# Patient Record
Sex: Female | Born: 1960 | Race: White | Hispanic: No | Marital: Married | Smoking: Former smoker
Health system: Southern US, Community
[De-identification: ages and names within clinical notes are randomized; demographics above are authoritative.]

## PROBLEM LIST (undated history)

## (undated) DIAGNOSIS — M1990 Unspecified osteoarthritis, unspecified site: Secondary | ICD-10-CM

## (undated) DIAGNOSIS — G5600 Carpal tunnel syndrome, unspecified upper limb: Secondary | ICD-10-CM

## (undated) DIAGNOSIS — K7689 Other specified diseases of liver: Secondary | ICD-10-CM

## (undated) DIAGNOSIS — K219 Gastro-esophageal reflux disease without esophagitis: Secondary | ICD-10-CM

## (undated) DIAGNOSIS — M1220 Villonodular synovitis (pigmented), unspecified site: Secondary | ICD-10-CM

## (undated) DIAGNOSIS — E039 Hypothyroidism, unspecified: Secondary | ICD-10-CM

## (undated) DIAGNOSIS — R7303 Prediabetes: Secondary | ICD-10-CM

## (undated) DIAGNOSIS — T41205A Adverse effect of unspecified general anesthetics, initial encounter: Secondary | ICD-10-CM

## (undated) DIAGNOSIS — I471 Supraventricular tachycardia: Secondary | ICD-10-CM

## (undated) DIAGNOSIS — Z9889 Other specified postprocedural states: Secondary | ICD-10-CM

## (undated) DIAGNOSIS — Z973 Presence of spectacles and contact lenses: Secondary | ICD-10-CM

## (undated) DIAGNOSIS — R112 Nausea with vomiting, unspecified: Secondary | ICD-10-CM

## (undated) HISTORY — PX: CARPAL TUNNEL RELEASE: SHX101

## (undated) HISTORY — PX: OTHER SURGICAL HISTORY: SHX169

## (undated) HISTORY — PX: PARTIAL HYSTERECTOMY: SHX80

## (undated) HISTORY — PX: THYROIDECTOMY, PARTIAL: SHX18

## (undated) HISTORY — DX: Supraventricular tachycardia: I47.1

---

## 2000-10-06 ENCOUNTER — Encounter (INDEPENDENT_AMBULATORY_CARE_PROVIDER_SITE_OTHER): Payer: Self-pay | Admitting: Specialist

## 2000-10-07 ENCOUNTER — Inpatient Hospital Stay (HOSPITAL_COMMUNITY): Admission: RE | Admit: 2000-10-07 | Discharge: 2000-10-08 | Payer: Self-pay | Admitting: Otolaryngology

## 2001-09-06 ENCOUNTER — Encounter: Payer: Self-pay | Admitting: Emergency Medicine

## 2001-09-06 ENCOUNTER — Emergency Department (HOSPITAL_COMMUNITY): Admission: EM | Admit: 2001-09-06 | Discharge: 2001-09-06 | Payer: Self-pay | Admitting: Emergency Medicine

## 2001-09-15 ENCOUNTER — Emergency Department (HOSPITAL_COMMUNITY): Admission: EM | Admit: 2001-09-15 | Discharge: 2001-09-16 | Payer: Self-pay | Admitting: Emergency Medicine

## 2001-09-16 ENCOUNTER — Encounter: Payer: Self-pay | Admitting: Emergency Medicine

## 2001-09-19 ENCOUNTER — Encounter (INDEPENDENT_AMBULATORY_CARE_PROVIDER_SITE_OTHER): Payer: Self-pay | Admitting: Specialist

## 2001-09-19 ENCOUNTER — Ambulatory Visit (HOSPITAL_COMMUNITY): Admission: RE | Admit: 2001-09-19 | Discharge: 2001-09-19 | Payer: Self-pay | Admitting: *Deleted

## 2001-09-21 ENCOUNTER — Ambulatory Visit (HOSPITAL_COMMUNITY): Admission: RE | Admit: 2001-09-21 | Discharge: 2001-09-21 | Payer: Self-pay | Admitting: *Deleted

## 2001-09-21 ENCOUNTER — Encounter: Payer: Self-pay | Admitting: *Deleted

## 2002-01-11 ENCOUNTER — Ambulatory Visit (HOSPITAL_COMMUNITY): Admission: RE | Admit: 2002-01-11 | Discharge: 2002-01-11 | Payer: Self-pay | Admitting: Orthopedic Surgery

## 2002-01-11 ENCOUNTER — Encounter: Payer: Self-pay | Admitting: Orthopedic Surgery

## 2002-08-17 HISTORY — PX: CHOLECYSTECTOMY: SHX55

## 2003-04-17 ENCOUNTER — Emergency Department (HOSPITAL_COMMUNITY): Admission: EM | Admit: 2003-04-17 | Discharge: 2003-04-17 | Payer: Self-pay | Admitting: Emergency Medicine

## 2003-04-19 ENCOUNTER — Encounter: Payer: Self-pay | Admitting: Unknown Physician Specialty

## 2003-04-19 ENCOUNTER — Ambulatory Visit (HOSPITAL_COMMUNITY): Admission: RE | Admit: 2003-04-19 | Discharge: 2003-04-19 | Payer: Self-pay | Admitting: Unknown Physician Specialty

## 2003-05-03 ENCOUNTER — Encounter: Admission: RE | Admit: 2003-05-03 | Discharge: 2003-05-03 | Payer: Self-pay | Admitting: General Surgery

## 2003-05-03 ENCOUNTER — Encounter: Payer: Self-pay | Admitting: General Surgery

## 2003-05-04 ENCOUNTER — Encounter: Payer: Self-pay | Admitting: Emergency Medicine

## 2003-05-04 ENCOUNTER — Emergency Department (HOSPITAL_COMMUNITY): Admission: EM | Admit: 2003-05-04 | Discharge: 2003-05-04 | Payer: Self-pay | Admitting: Emergency Medicine

## 2003-05-17 ENCOUNTER — Ambulatory Visit (HOSPITAL_COMMUNITY): Admission: RE | Admit: 2003-05-17 | Discharge: 2003-05-17 | Payer: Self-pay | Admitting: Internal Medicine

## 2003-05-17 ENCOUNTER — Encounter (INDEPENDENT_AMBULATORY_CARE_PROVIDER_SITE_OTHER): Payer: Self-pay | Admitting: Internal Medicine

## 2003-06-17 ENCOUNTER — Emergency Department (HOSPITAL_COMMUNITY): Admission: EM | Admit: 2003-06-17 | Discharge: 2003-06-17 | Payer: Self-pay | Admitting: Emergency Medicine

## 2003-07-06 ENCOUNTER — Ambulatory Visit (HOSPITAL_COMMUNITY): Admission: RE | Admit: 2003-07-06 | Discharge: 2003-07-06 | Payer: Self-pay | Admitting: Internal Medicine

## 2004-10-24 ENCOUNTER — Ambulatory Visit: Payer: Self-pay | Admitting: Internal Medicine

## 2004-11-05 ENCOUNTER — Ambulatory Visit (HOSPITAL_COMMUNITY): Admission: RE | Admit: 2004-11-05 | Discharge: 2004-11-05 | Payer: Self-pay | Admitting: Internal Medicine

## 2004-11-05 ENCOUNTER — Ambulatory Visit: Payer: Self-pay | Admitting: Internal Medicine

## 2004-11-06 ENCOUNTER — Ambulatory Visit (HOSPITAL_COMMUNITY): Admission: RE | Admit: 2004-11-06 | Discharge: 2004-11-06 | Payer: Self-pay | Admitting: Internal Medicine

## 2004-11-25 ENCOUNTER — Ambulatory Visit: Payer: Self-pay | Admitting: Internal Medicine

## 2005-08-03 ENCOUNTER — Ambulatory Visit (HOSPITAL_COMMUNITY): Admission: RE | Admit: 2005-08-03 | Discharge: 2005-08-03 | Payer: Self-pay | Admitting: Internal Medicine

## 2005-08-03 ENCOUNTER — Encounter (INDEPENDENT_AMBULATORY_CARE_PROVIDER_SITE_OTHER): Payer: Self-pay | Admitting: Internal Medicine

## 2005-08-03 ENCOUNTER — Ambulatory Visit: Payer: Self-pay | Admitting: Internal Medicine

## 2006-03-27 ENCOUNTER — Emergency Department (HOSPITAL_COMMUNITY): Admission: EM | Admit: 2006-03-27 | Discharge: 2006-03-27 | Payer: Self-pay | Admitting: Emergency Medicine

## 2006-09-16 ENCOUNTER — Ambulatory Visit: Payer: Self-pay | Admitting: Internal Medicine

## 2007-06-22 ENCOUNTER — Ambulatory Visit: Payer: Self-pay | Admitting: Cardiology

## 2007-06-22 ENCOUNTER — Observation Stay (HOSPITAL_COMMUNITY): Admission: EM | Admit: 2007-06-22 | Discharge: 2007-06-23 | Payer: Self-pay | Admitting: *Deleted

## 2007-10-31 ENCOUNTER — Emergency Department (HOSPITAL_COMMUNITY): Admission: EM | Admit: 2007-10-31 | Discharge: 2007-10-31 | Payer: Self-pay | Admitting: Emergency Medicine

## 2008-08-05 ENCOUNTER — Emergency Department (HOSPITAL_COMMUNITY): Admission: EM | Admit: 2008-08-05 | Discharge: 2008-08-05 | Payer: Self-pay | Admitting: Emergency Medicine

## 2008-10-20 IMAGING — CR DG CHEST 1V PORT
1 series · 1 of 1 positions shown · non-contrast
Comparison: None.

CLINICAL DATA: Chest pain.

PORTABLE CHEST - 1 VIEW  [DATE]/6000 0252 hours:

[view not recorded]
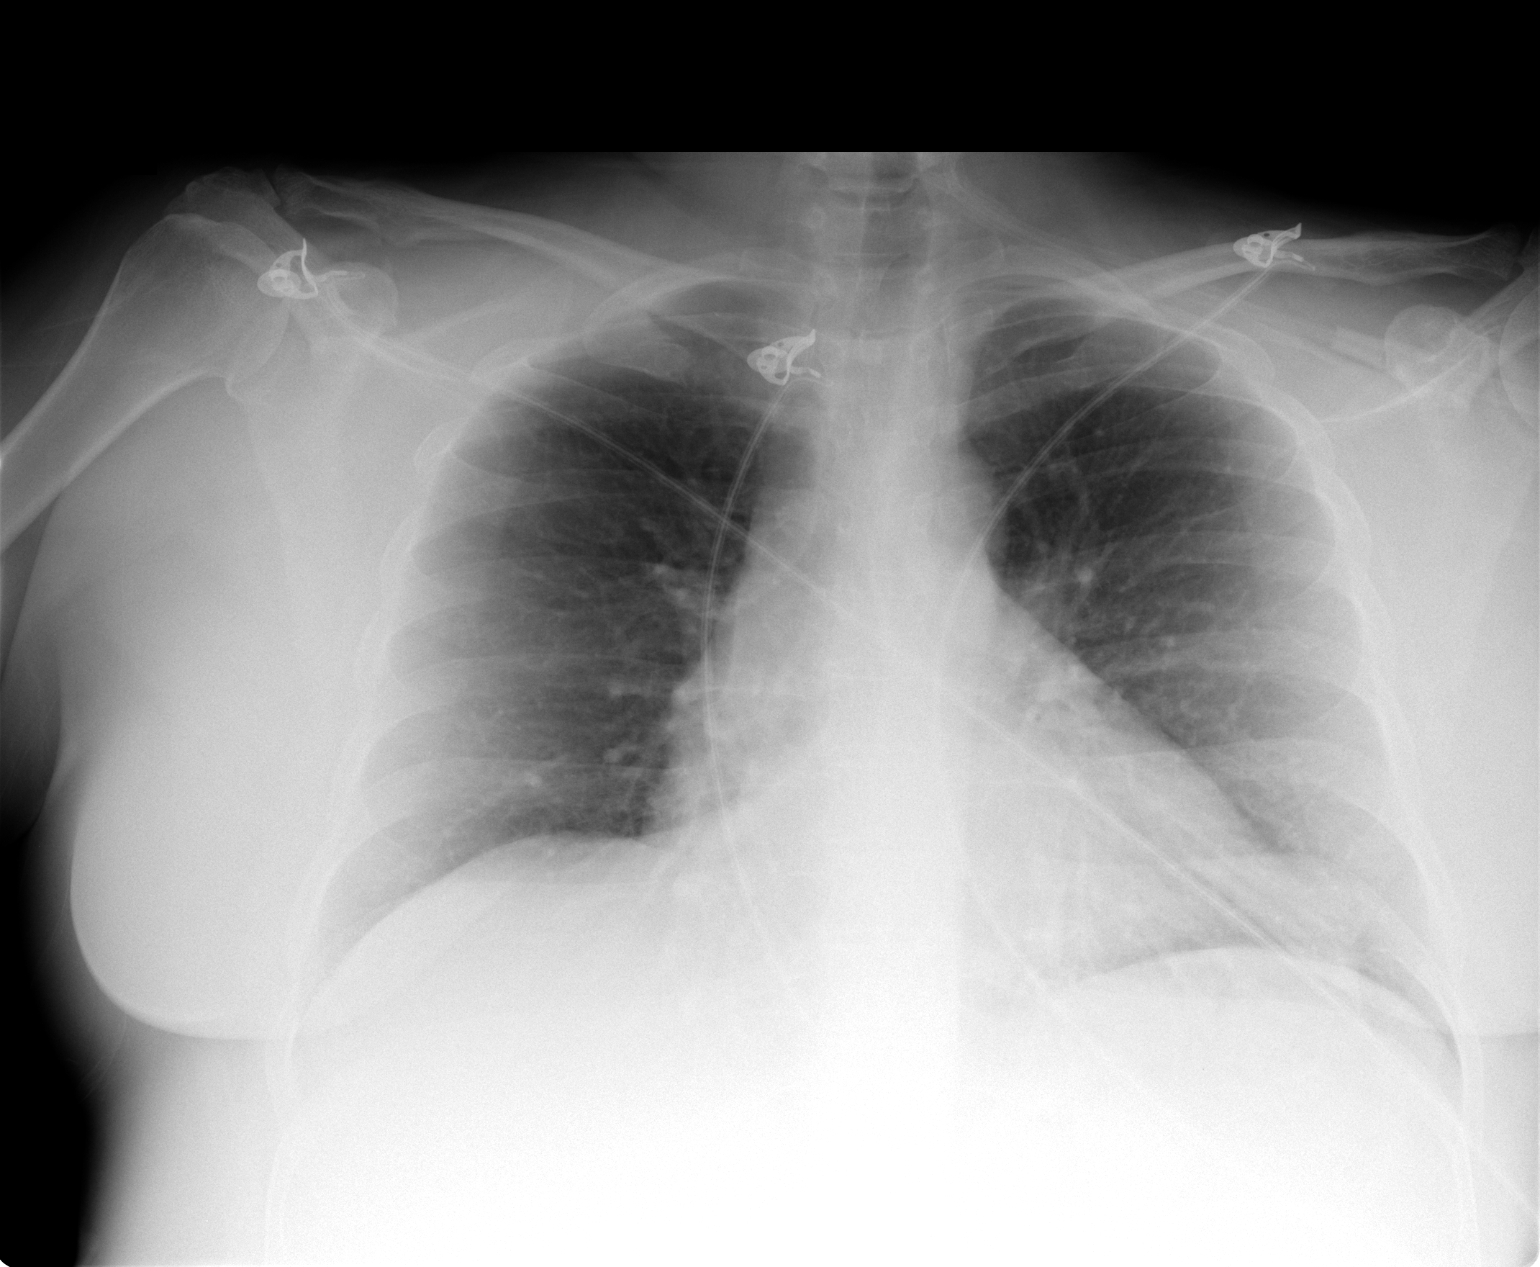

[1 of 1 positions shown; findings below may reference images not displayed]

FINDINGS: Heart size normal for the [facility] portable technique. Lungs clear. No
significant pleural effusions.
IMPRESSION: No acute cardiopulmonary disease.

## 2008-10-20 IMAGING — CT CT ANGIO CHEST
1 of 2 series · 19 of 32 positions shown · IV contrast (Omnipaque 300)
Comparison: none

CLINICAL DATA: Chest pain, shortness of breath

[Series 9: thin pacs · axial · 0.81mm/px · z∈[+804,+1028]mm · 19 of 249 slices shown]
[im 13/249  lung]
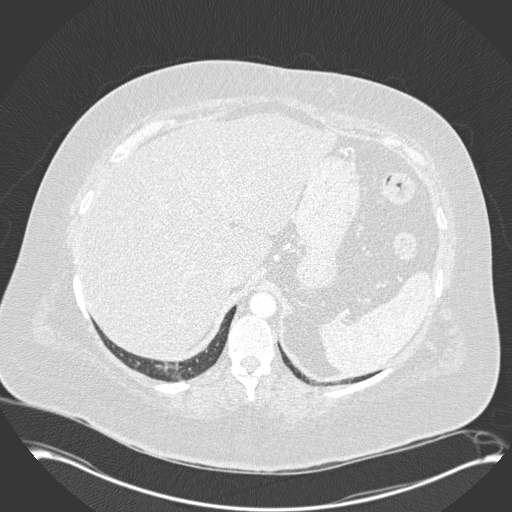
[im 25/249  mediastinal]
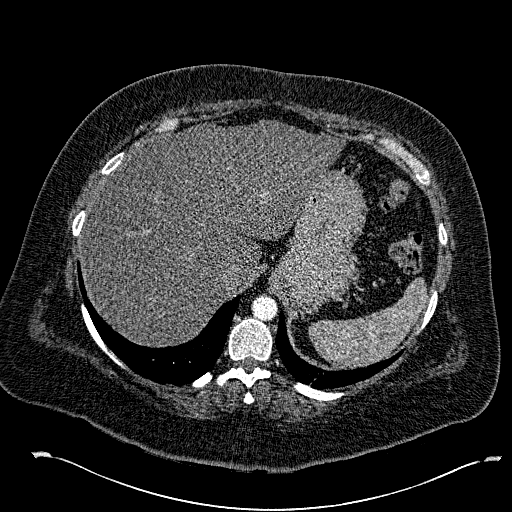
[im 38/249  lung]
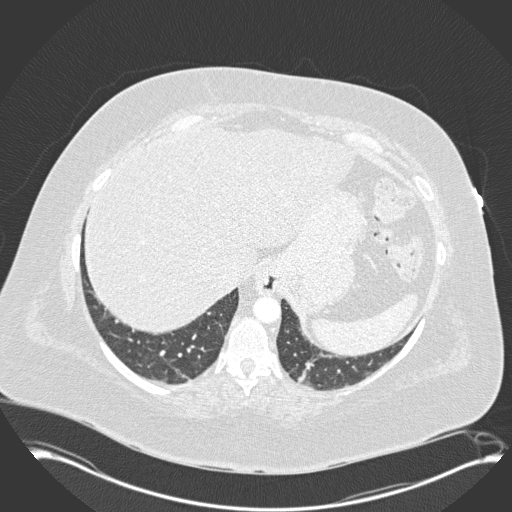
[im 63/249  mediastinal]
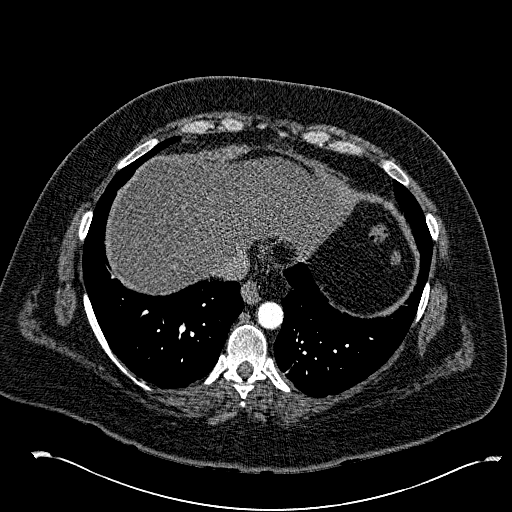
[im 75/249  lung]
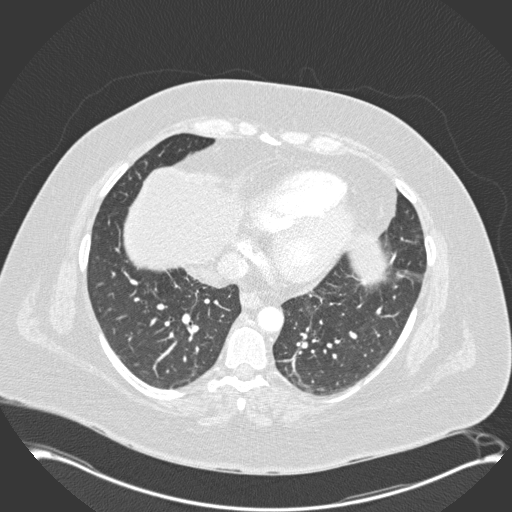
[im 83/249  mediastinal]
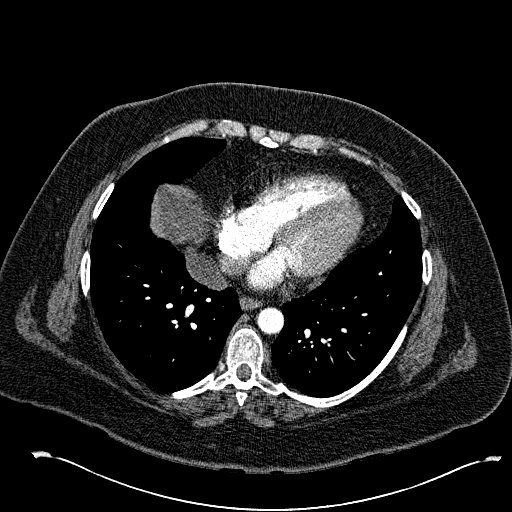
[im 87/249  lung]
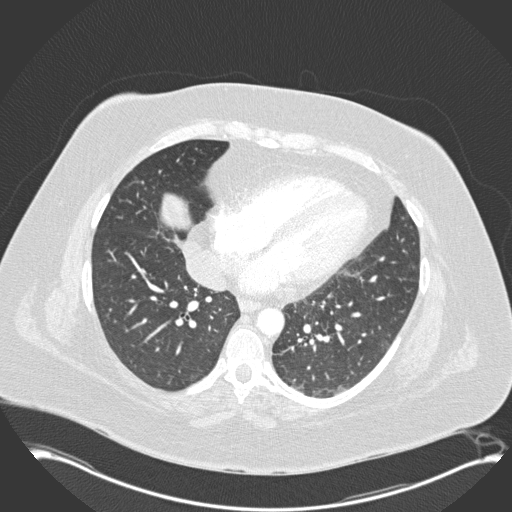
[im 100/249  mediastinal]
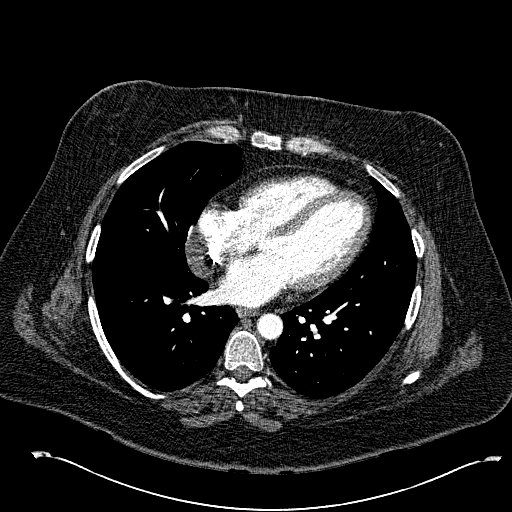
[im 112/249  lung]
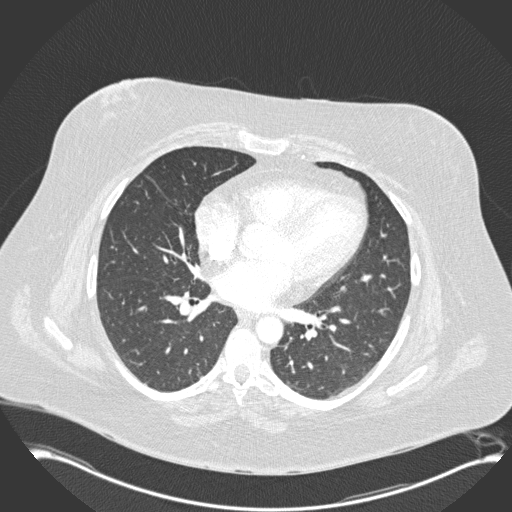
[im 125/249  mediastinal]
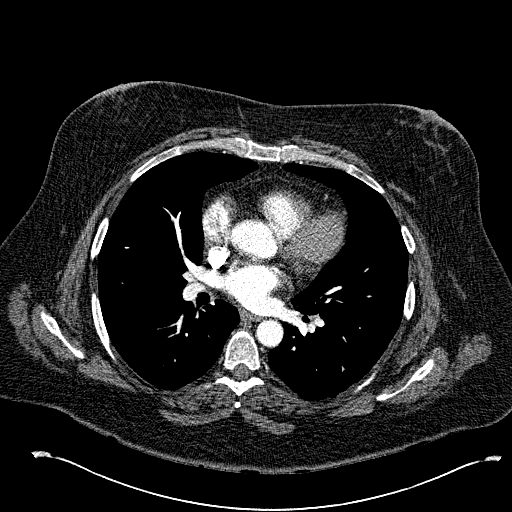
[im 137/249  lung]
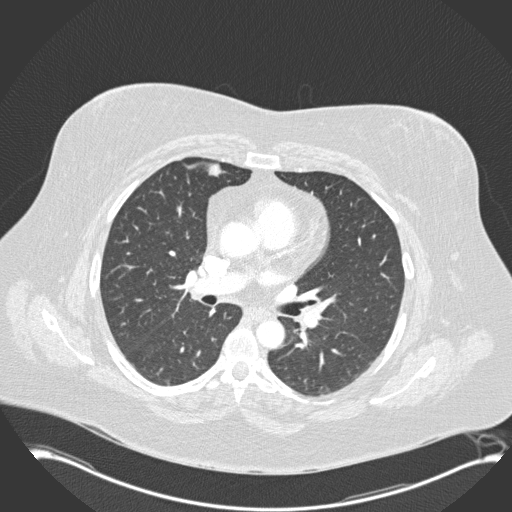
[im 149/249  mediastinal]
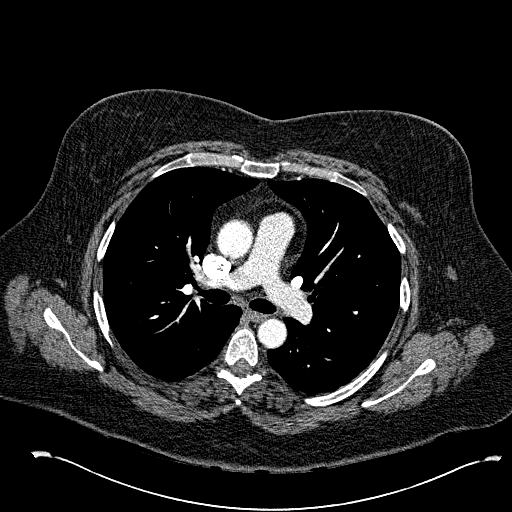
[im 162/249  lung]
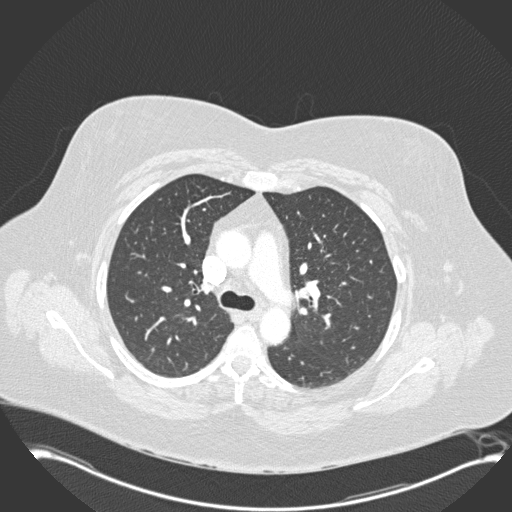
[im 166/249  mediastinal]
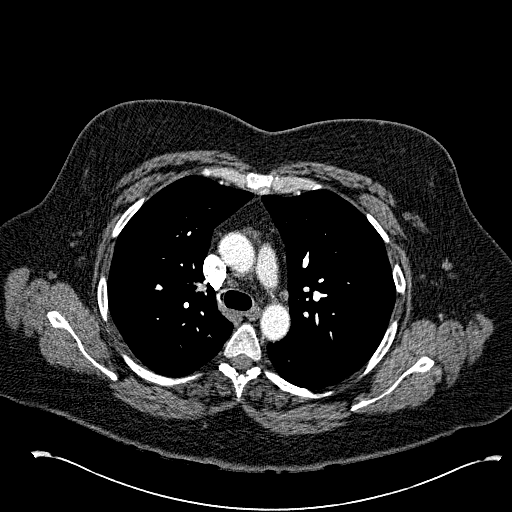
[im 174/249  lung]
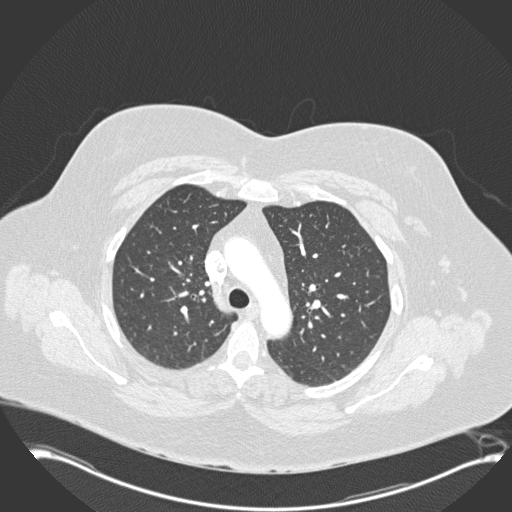
[im 187/249  mediastinal]
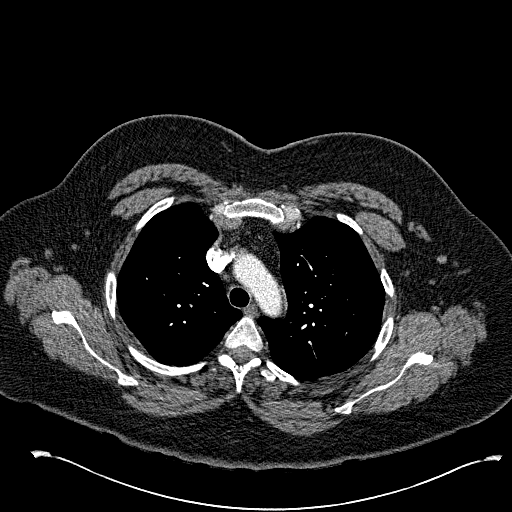
[im 211/249  lung]
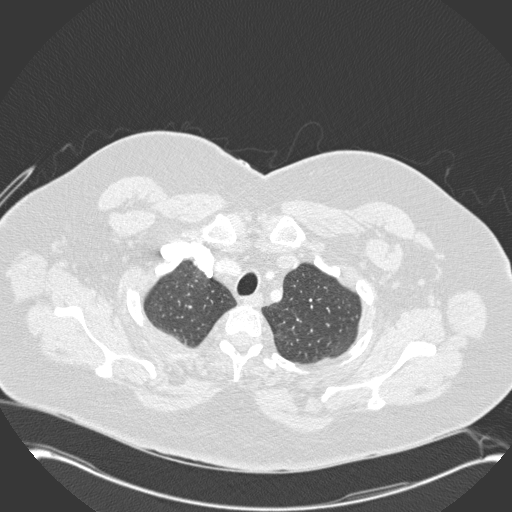
[im 224/249  mediastinal]
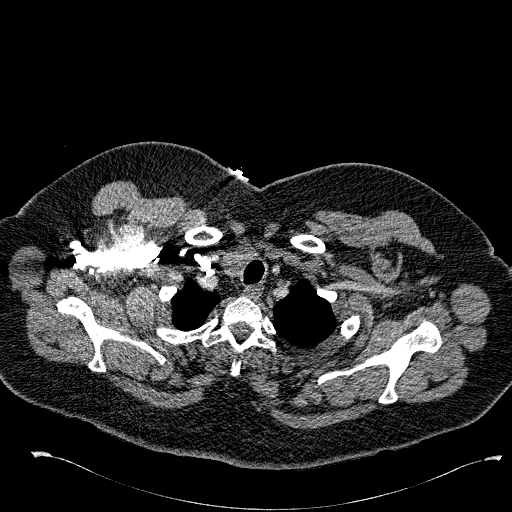
[im 236/249  lung]
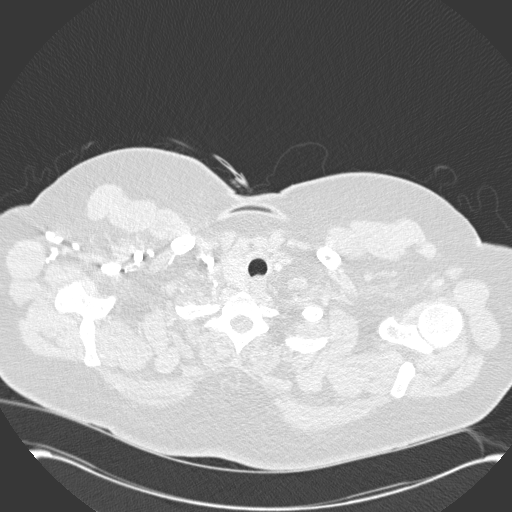

[19 of 32 positions shown; findings below may reference images not displayed]

CT angiogram chest   with contrast:

Multidetector helical CT of the chest was obtained after 80 ml Omnipaque 300 
IV. CT multiplanar reconstructions were rendered to evaluate the vascular
anatomy.
No previous for comparison. There is good contrast opacification of pulmonary
artery branches with no discrete filling defect to suggest acute PE. Good
enhancement of the thoracic aorta, which is unremarkable. No pleural or
pericardial effusion. 2.4 x 5 cm fluid attenuation region posterior to the right
atrium, probably a pericardial cyst. No hilar or mediastinal adenopathy.
Apparent partial left thyroidectomy. Diffuse fatty infiltration of the
visualized portions of liver. Linear scarring or atelectasis in both lung bases.
No focal infiltrate or nodule. Minimal spondylitic changes in the lower thoracic
spine.
IMPRESSION: 1. Negative for acute PE or thoracic aortic dissection.
2. 5 cm pericardial lesion, probably benign pericardial cyst. MR Kaki be useful
to confirm and exclude neoplasm.
3. Fatty liver

## 2009-08-17 HISTORY — PX: FRACTURE SURGERY: SHX138

## 2009-08-17 HISTORY — PX: TIBIA HARDWARE REMOVAL: SUR1133

## 2009-10-18 ENCOUNTER — Ambulatory Visit (HOSPITAL_BASED_OUTPATIENT_CLINIC_OR_DEPARTMENT_OTHER): Admission: RE | Admit: 2009-10-18 | Discharge: 2009-10-18 | Payer: Self-pay | Admitting: Orthopedic Surgery

## 2010-01-12 ENCOUNTER — Emergency Department (HOSPITAL_COMMUNITY): Admission: EM | Admit: 2010-01-12 | Discharge: 2010-01-12 | Payer: Self-pay | Admitting: Emergency Medicine

## 2010-01-15 ENCOUNTER — Ambulatory Visit (HOSPITAL_BASED_OUTPATIENT_CLINIC_OR_DEPARTMENT_OTHER): Admission: RE | Admit: 2010-01-15 | Discharge: 2010-01-16 | Payer: Self-pay | Admitting: Orthopedic Surgery

## 2010-09-07 ENCOUNTER — Encounter: Payer: Self-pay | Admitting: Internal Medicine

## 2010-09-18 ENCOUNTER — Emergency Department (HOSPITAL_COMMUNITY)
Admission: EM | Admit: 2010-09-18 | Discharge: 2010-09-18 | Disposition: A | Discharge status: Home / Self Care | Payer: BC Managed Care – PPO | Attending: Emergency Medicine | Admitting: Emergency Medicine

## 2010-09-18 ENCOUNTER — Emergency Department (HOSPITAL_COMMUNITY)
Admit: 2010-09-18 | Discharge: 2010-09-18 | Disposition: A | Discharge status: Home / Self Care | Payer: BC Managed Care – PPO

## 2010-09-18 DIAGNOSIS — R112 Nausea with vomiting, unspecified: Secondary | ICD-10-CM | POA: Insufficient documentation

## 2010-09-18 DIAGNOSIS — R1031 Right lower quadrant pain: Secondary | ICD-10-CM | POA: Insufficient documentation

## 2010-09-18 LAB — DIFFERENTIAL
Basophils Relative: 0 % (ref 0–1)
Eosinophils Absolute: 0.2 10*3/uL (ref 0.0–0.7)
Eosinophils Relative: 2 % (ref 0–5)
Lymphs Abs: 2.7 10*3/uL (ref 0.7–4.0)
Monocytes Absolute: 0.5 10*3/uL (ref 0.1–1.0)
Neutro Abs: 5 10*3/uL (ref 1.7–7.7)

## 2010-09-18 LAB — COMPREHENSIVE METABOLIC PANEL
ALT: 26 U/L (ref 0–35)
Albumin: 4 g/dL (ref 3.5–5.2)
BUN: 9 mg/dL (ref 6–23)
CO2: 27 mEq/L (ref 19–32)
Calcium: 9.6 mg/dL (ref 8.4–10.5)
Chloride: 104 mEq/L (ref 96–112)
Creatinine, Ser: 0.81 mg/dL (ref 0.4–1.2)
GFR calc non Af Amer: >60 mL/min (ref >60)
Total Protein: 6.8 g/dL (ref 6.0–8.3)

## 2010-09-18 LAB — CBC
HCT: 37.4 % (ref 36.0–46.0)
MCHC: 35.3 g/dL (ref 30.0–36.0)
MCV: 85.6 fL (ref 78.0–100.0)
WBC: 8.3 10*3/uL (ref 4.0–10.5)

## 2010-09-18 LAB — URINALYSIS, ROUTINE W REFLEX MICROSCOPIC
Ketones, ur: NEGATIVE mg/dL
Nitrite: NEGATIVE
Specific Gravity, Urine: 1.02 (ref 1.005–1.030)
pH: 6 (ref 5.0–8.0)

## 2010-09-18 MED ORDER — IOHEXOL 300 MG/ML  SOLN
100.0000 mL | Freq: Once | INTRAMUSCULAR | Status: AC | PRN
  Administered 2010-09-18: 100 mL via INTRAVENOUS

## 2010-10-01 ENCOUNTER — Ambulatory Visit (INDEPENDENT_AMBULATORY_CARE_PROVIDER_SITE_OTHER): Payer: BC Managed Care – PPO | Admitting: Internal Medicine

## 2010-10-01 DIAGNOSIS — K2270 Barrett's esophagus without dysplasia: Secondary | ICD-10-CM

## 2010-10-01 DIAGNOSIS — Z86010 Personal history of colonic polyps: Secondary | ICD-10-CM

## 2010-10-01 DIAGNOSIS — K625 Hemorrhage of anus and rectum: Secondary | ICD-10-CM

## 2010-10-16 ENCOUNTER — Ambulatory Visit (HOSPITAL_BASED_OUTPATIENT_CLINIC_OR_DEPARTMENT_OTHER)
Admission: RE | Admit: 2010-10-16 | Discharge: 2010-10-16 | Disposition: A | Discharge status: Home / Self Care | Payer: BC Managed Care – PPO | Source: Ambulatory Visit | Attending: Orthopedic Surgery | Admitting: Orthopedic Surgery

## 2010-10-16 DIAGNOSIS — M659 Synovitis and tenosynovitis, unspecified: Secondary | ICD-10-CM | POA: Insufficient documentation

## 2010-10-16 DIAGNOSIS — Z472 Encounter for removal of internal fixation device: Secondary | ICD-10-CM | POA: Insufficient documentation

## 2010-10-16 DIAGNOSIS — M65979 Unspecified synovitis and tenosynovitis, unspecified ankle and foot: Secondary | ICD-10-CM | POA: Insufficient documentation

## 2010-10-16 LAB — POCT HEMOGLOBIN-HEMACUE: Hemoglobin: 14 g/dL (ref 12.0–15.0)

## 2010-10-22 ENCOUNTER — Encounter (INDEPENDENT_AMBULATORY_CARE_PROVIDER_SITE_OTHER): Payer: BC Managed Care – PPO | Admitting: Internal Medicine

## 2010-10-29 NOTE — Op Note (Signed)
  NAMEBAKER, KOGLER NO.:  [id number]  MEDICAL RECORD NO.:  [id number]           PATIENT TYPE:  LOCATION:                                 FACILITY:  PHYSICIAN:  Loreta Ave, M.D. DATE OF BIRTH:  [birth year]  DATE OF PROCEDURE:  10/16/2010 DATE OF DISCHARGE:                              OPERATIVE REPORT   PREOPERATIVE DIAGNOSES:  Right ankle painful retained hardware, distal fibula, right ankle.  Secondary tenosynovitis, peroneal tendons adjacent to the plate.  POSTOPERATIVE DIAGNOSES:  Right ankle painful retained hardware, distal fibula, right ankle.  Secondary tenosynovitis, peroneal tendons adjacent to the plate with a moderate amount of tenosynovitis and fluid within the peroneal tendon sheaths.  Healed fracture.  PROCEDURE:  Right ankle removal of plate and screws.  Tenosynovectomy and decompression of peroneal tendons.  SURGEON:  Loreta Ave, MD.  ASSISTANT:  Genene Churn. Barry Dienes, [place].  ANESTHESIA:  General.  BLOOD LOSS:  Minimal.  SPECIMEN:  None.  CULTURES:  None.  COMPLICATIONS:  None.  DRESSING:  Soft compressive short-leg splint.  PROCEDURE IN DETAIL:  The patient was brought to the operating room, placed on the operating table in supine position.  After adequate anesthesia had been obtained, tourniquet applied.  Prepped and draped in usual sterile fashion.  Exsanguinated with elevation, Esmarch tourniquet was inflated to 250 mmHg.  At the previous incision, lateral aspect of the ankle distally was opened.  Skin and subcutaneous tissue was divided.  Moderate amount of edema and clear fluid in the area.  The plate was exposed.  The 7 screws, all removed.  The plate removed.  The fracture below was nicely healed.  A little bony prominence contour throughout.  Just behind that the peroneal tendon sheath was opened and yield any fluid and tenosynovitis throughout that.  That was decompressed all the way down to the ankle just above  the retinaculum holding the peroneal tendons behind the fibula.  Tenosynovectomy fluid was debrided.  No evidence of infection.  Wound was copiously irrigated. Closed with Vicryl and staples.  Margins were injected with Marcaine.  Sterile compressive dressing applied.  Short-leg splint applied.  Tourniquet was inflated and removed.  Anesthesia reversed.  Brought to the recovery room.  Tolerated the surgery well.  No complications.     Loreta Ave, M.D.     DFM/MEDQ  D:  10/16/2010  T:  10/17/2010  Job:  [id number]  Electronically Signed by Mckinley Jewel M.D. on 10/29/2010 01:35:37 PM

## 2010-11-03 LAB — POCT HEMOGLOBIN-HEMACUE: Hemoglobin: 12.5 g/dL (ref 12.0–15.0)

## 2010-11-06 ENCOUNTER — Ambulatory Visit (HOSPITAL_BASED_OUTPATIENT_CLINIC_OR_DEPARTMENT_OTHER)
Admission: RE | Admit: 2010-11-06 | Payer: BC Managed Care – PPO | Source: Ambulatory Visit | Admitting: Orthopedic Surgery

## 2010-11-09 NOTE — Consult Note (Signed)
Shelly [name], Shelly [name]               ACCOUNT NO.:  [id number]  MEDICAL RECORD NO.:  [id number]           PATIENT TYPE: AMB  LOCATION: [place]                    FACILITY:  clinic   PHYSICIAN:  Lionel December, M.D.    DATE OF BIRTH:  06-Feb-1961  DATE OF CONSULTATION: 10/01/2010 DATE OF DISCHARGE:                                 CONSULTATION   REASON FOR CONSULTATION:  Breakthrough acid reflux and rectal bleeding.  HISTORY OF PRESENT ILLNESS:  Shelly [name] is a 50 year old female presenting today with complaints of breakthrough acid reflux for more than 2 months.  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  In 2006, she underwent an EGD by Dr. Karilyn Cota, and she was found to have a small less than 1 cm patch of erythematous mucosa at the GE junction, which was positive for Barrett esophagus.  She also had a noncritical Schatzki's ring and a moderate size sliding hiatal hernia.  Dr. Karilyn Cota referred her to [facility] and she was seen by Dr. Lorin Picket.  She was not a good surgical candidate.  Her last colonoscopy was in May 2009 at [facility] by Dr. Loreta Ave.  She underwent it for a 16-month history of nonbloody diarrhea with lower abdominal cramps and urgency.  There was no evidence of colitis.  Random biopsies were taken.  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  She also states that she has had rectal bleeding about 2-3 times a week.  The bleeding is not related to any constipation or diarrhea.  She is not taking any NSAIDs.  She does, however, have episodes of constipation.  She was recently seen in the emergency room for abdominal pain, and her exam was essentially normal.  Her labs from the ED on September 18, 2010:  Her WBC count was 8.3, hemoglobin 13.2, hematocrit 37.4, MCV 85.6, platelets 229.  Her sodium was 140, potassium was 4.4, chloride 104, glucose 98, BUN 9, creatinine 0.81, calcium 9.6, total protein 6.8, albumin 4.0, AST 17, ALT 26, ALP 59, total  bili 0.6.  Her urine was negative.  She underwent a CT of the abdomen with contrast, which revealed a normal appendix.  No acute abdominal process.  Stable degenerative and postoperative changes.  Her abdominal pain was located in the right lower quadrant.  She is allergic to SULFA.  MEDICATIONS: 1. Nexium 20 mg a day. 2. Thyroid medication 60 mg 5 days a week and 75 mg 2 days a week.  OBJECTIVE:  VITAL SIGNS:  Her weight is 221, her height is 5 feet 6 inches.  Her blood pressure is 104/82, her temperature is 97.7, her pulse is 76. HEENT:  She has natural teeth.  Her oral mucosa is moist.  No lesions. Her conjunctivae is pink.  Her sclerae are anicteric.  Thyroid is normal.  There is no cervical lymphadenopathy. LUNGS:  Clear. HEART:  Regular rate and rhythm. ABDOMEN:  Soft.  Bowel sounds are positive.  No tenderness.  No masses felt.  Her stool was guaiac negative.  ASSESSMENT:  Shelly [name] is a 50 year old female presenting today with complaints of breakthrough acid reflux for about 2 months.  She also complains of having noticed some rectal bleeding, which is occurring 2-3 times a week.  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  RECOMMENDATIONS:  We will schedule an EGD with Dr. Karilyn Cota to rule out peptic ulcer disease and for surveillance of her Barrett esophagus.  Her last EGD apparently was in 2006 per her records.  We will also schedule a colonoscopy with Dr. Karilyn Cota with this new onset of rectal bleeding.  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  We will increase her Nexium 40 mg to twice a day, and the risks and benefits were reviewed with the patient.  She is agreeable.    ______________________________ Dorene Ar, NP   ______________________________ Lionel December, M.D.    TS/MEDQ  D:  10/01/2010  T:  10/02/2010  Job:  [id number]  Electronically Signed by Dorene Ar PA on 11/07/2010 11:34:47 AM Electronically Signed by Lionel December  M.D. on 11/09/2010 01:22:06 PM

## 2010-12-30 NOTE — Consult Note (Signed)
NAMECERRIA, RANDHAWA               ACCOUNT NO.:  [id number]   MEDICAL RECORD NO.:  [id number]          PATIENT TYPE:  OBV   LOCATION:  A213                          FACILITY:  [facility]   PHYSICIAN:  Gerrit Friends. Dietrich Pates, MD, FACCDATE OF BIRTH:  1961-04-21   DATE OF CONSULTATION:  06/22/2007  DATE OF DISCHARGE:                                 CONSULTATION   CARDIOLOGY CONSULTATION.   REFERRING PHYSICIAN:  Hollice Espy, M.D.   PRIMARY CARE PHYSICIAN:  Dr. Samuel Jester   HISTORY OF PRESENT ILLNESS:  50 year old woman with no prior cardiac  history presents with chest pain.  Ms. Gadomski has had a number of  interesting health problems, but not much in the way of chronic medical  conditions.  She has not had hypertension nor diabetes.  She is unaware  of her lipid status.  She does smoke cigarettes with a total consumption  of 20-pack-years.  She has never previously been evaluated by a  cardiologist nor undergone any cardiac testing.  She has no significant  history of chest discomfort nor dyspnea.   The current incident began when she was driving in her car.  She noted  the sudden onset of very severe anterior and right upper chest pressure  that prevented her from continuing to drive.  After approximately 10  minutes, the pain waned in intensity.  Over the rest of the evening, she  had waxing and waning symptoms.  She was able to sleep but awoke with  continuing discomfort prompting her to come to the emergency department.  There was some associated lightheadedness but no dyspnea.  She has had  palpitations in the past and did have some with this event as well as  diaphoresis.  There was no nausea and no emesis.   On arrival in the emergency department, she was given sublingual  nitroglycerin with improvement.  At the present time, she feels fine.  Initial cardiac markers are negative.   The past medical history is notable for hysterectomy for endometriosis,  partial  thyroidectomy for a thyroid adenoma with subsequent  hypothyroidism, resection of benign liver tumors that were causing  abdominal pain, and incidental cholecystectomy performed at the same  time.  She also has GERD and Barrett's esophagus.   The usual medications include only Armour Thyroid 75 mg daily and  Aciphex 20 mg daily.   ALLERGIES:  APPARENT AN ALLERGY TO SULFA DRUGS IS REPORTED, MANIFESTING  AS RASH.   SOCIAL HISTORY:  Married and lives in [place]; works in an office; no  excessive use of alcohol.   FAMILY HISTORY:  No prominent cardiac conditions.   REVIEW OF SYSTEMS:  Notable for intermittent migraine headaches, recent  skin eruption of the forearms that is resolving spontaneously, a regular  diet at home, the need for corrective lenses for far vision, anxiety and  sleep disturbance.  All other systems are reviewed and are negative.   PHYSICAL EXAMINATION:  GENERAL:  Well-appearing overweight woman in no  acute distress.  VITAL SIGNS:  The temperature is 98, heart rate 75 and regular,  respirations 18, blood  pressure 135/85, O2 saturations 100% on room air.  HEENT:  EOMs full; pupils equal, round, react to light; normal oral  mucosa.  NECK:  No jugular venous distention; normal carotid upstrokes without  bruits; surgical scar at the base of the neck.  LUNGS:  Clear.  CARDIAC:  Normal first and second heart sounds; normal PMI.  ABDOMEN:  Soft and nontender; no organomegaly; no masses.  EXTREMITIES:  No edema; normal distal pulses.  NEUROMUSCULAR:  Symmetric strength and tone; normal cranial nerves.   EKG:  Normal sinus rhythm; nondiagnostic inferior Q-waves; slightly  decreased voltage in the chest leads; otherwise normal.   INITIAL LABORATORIES:  Include a normal CBC, normal D-dimer, normal  chemistry profile except for glucose of 145, normal cardiac markers.   IMPRESSION:  Ms. Patton presents with sudden severe chest discomfort  without apparent etiology.   Atherosclerotic coronary disease is quite  unlikely.  Possible etiologies include reflux, esophageal spasm or other  intra-abdominal processes or coronary spasm.  Response to nitroglycerin  will occur in both coronary and esophageal spasm.  The pericardial cyst  seen on CT scanning of the chest is likely an incidental finding and  requires no further assessment.  We will proceed with a stress  echocardiogram to assess the heart structurally and functionally.  If  negative, I would discharge her with a prescription for sublingual  nitroglycerin and will plan to see her again if symptoms recur.   Thanks so much for referring this nice woman to [place].      Gerrit Friends. Dietrich Pates, MD, [facility]  Electronically Signed     RMR/MEDQ  D:  06/22/2007  T:  06/23/2007  Job:  [id number]   cc:   Hollice Espy, M.D.   Samuel Jester  Fax: [phone]

## 2010-12-30 NOTE — Discharge Summary (Signed)
NAMEREKA, Shelly [name]               ACCOUNT NO.:  [id number]   MEDICAL RECORD NO.:  [id number]          PATIENT TYPE:  OBV   LOCATION:  A213                          FACILITY:  [facility]   PHYSICIAN:  Osvaldo Shipper, MD     DATE OF BIRTH:  [birth year]   DATE OF ADMISSION:  06/22/2007  DATE OF DISCHARGE:  11/06/2008LH                               DISCHARGE SUMMARY   PRIMARY CARE PHYSICIAN:  Samuel Jester, M.D. in [place].   The patient was evaluated by Dr. Dietrich Pates from [facility].   DISCHARGE DIAGNOSES:  1. Chest pain, possible esophageal spasm.  Rule out for acute coronary      syndrome.  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  3. Hypothyroidism.  4. Gastroesophageal reflux disease and Barrett's esophagus.  5. Obesity.   PRESENTING ILLNESS:  Please review the history and physical dictated  yesterday for details.   BRIEF HOSPITAL COURSE:  This is a 50 year old white female who is obese  who presented with chest pain yesterday, the onset of which was the day  before yesterday night.  The patient was admitted to the hospital for  further evaluation.  The only risk factor that she has is obesity.  The  patient was admitted.  She was ruled out for acute coronary syndrome.  CT of the chest was done which ruled out PE and dissection.  We did  consult [place] cardiology to consider an inpatient stress test.  The  patient did have on the CT angiogram a possible pericardial cyst that  was detected.  An MR was recommended.  [place] cardiologist, Dr.  Dietrich Pates, was not too impressed with the cyst, and did not think that  that had any role to play in this onset of chest pain.  In any case,  today the patient was feeling better, her pain had resolved almost  completely.  She was in better spirits.  The plan was for the patient to  undergo a stress test.  I had instructed the nurse that once the stress  test was negative and if the cardiologist calls to say  everything looks  okay, she can be discharged.  The patient was discharged.  I spoke with  the nurse, and Dr. Dietrich Pates did clear her for discharge, and I am  assuming that the non-invasive test for coronary artery disease was  negative.  Results of the lipid profile came to [place] a little bit later  on, and her cholesterol was 245, triglycerides 177, LDL 180 and HDL of  30.  She probably will need to be treated.  I will call her and tell her  to discuss this with her primary care physician.  Her thyroid function  tests were within the normal range.  The rest of the labs were pretty  unremarkable.  She did have a mildly elevated Hb1C of 6.5, though she is  not a clearly known diabetic.  Glucose when she was admitted was 145.  I  will convey this also to the patient so that she can discuss this with  her primary medical doctor.  DISCHARGE MEDICATIONS:  1. Aspirin 81 mg once a day.  2. Nitroglycerin 0.4 mg sublingually every 5 minutes as needed x3 for      chest pain.  3. Armor thyroid 75 mg daily.  4. Aciphex 20 mg daily.   FOLLOWUP:  1. Follow up with cardiology as they determine.  2. For MRI of the thorax to evaluate this pericardial cyst.  This will      also be sent to her primary medical doctor.  3. She has also been told to follow up with Dr. Karilyn Cota, who is a      gastroenterologist, to see if she needs evaluation for esophageal      spasms.   DIET:  Heart healthy diet.   PHYSICAL ACTIVITY:  No restrictions.   She was asked to return to work the very next day.   CONSULTATIONS:  [facility], as mentioned above.   We also did an echocardiogram, which is pending at this time.   Please note that less than 30 minutes was spent in discharging this  individual.      Osvaldo Shipper, MD  Electronically Signed     GK/MEDQ  D:  06/23/2007  T:  06/23/2007  Job:  [id number]   cc:   Samuel Jester  Fax: [phone]   Gerrit Friends. Dietrich Pates, MD, [facility]  [address]  [place],  [place] [address]

## 2010-12-30 NOTE — Procedures (Signed)
Shelly [name], Shelly [name]               ACCOUNT NO.:  [id number]   MEDICAL RECORD NO.:  [id number]          PATIENT TYPE:  OUT   LOCATION:  [facility]                           FACILITY:  [facility]   PHYSICIAN:  Gerrit Friends. Dietrich Pates, MD, FACCDATE OF BIRTH:  [birth year]   DATE OF PROCEDURE:  06/23/2007  DATE OF DISCHARGE:                                ECHOCARDIOGRAM   REFERRING PHYSICIAN:  Dr. Rito Ehrlich.   CLINICAL DATA:  50 year old woman admitted to hospital with chest pain.   1. Treadmill exercise performed to a workload of 7 METS and a heart      rate of 155, 90% of age-predicted maximum.  Exercise discontinued      due to fatigue; no chest pain reported.  2. Blood pressure increased from a resting value of 125/80 to 170/70      at peak exercise, a normal response.  3. No arrhythmias noted.  4. Resting EKG:  Normal sinus rhythm; borderline left atrial      abnormality; nondiagnostic inferior Q waves; minor nonspecific T-      wave abnormality.  5. Stress EKG:  Insignificant upsloping ST-segment depression.  6. Resting echocardiogram:  Technically suboptimal; normal mitral and      aortic valves; borderline right and left ventricular hypertrophy;      normal left atrial size; normal left ventricular size and function.  7. Post-exercise echocardiogram:  Very suboptimal images; there      appears to be cavity obliteration during systole; no segmental wall      motion abnormality is appreciated.   IMPRESSION:  Negative stress echocardiogram revealing impaired exercise  capacity, a rapid increase in heart rate at a low-level of exercise  consistent with physical deconditioning, a normal blood pressure  response, and neither electrocardiographic nor echocardiographic  evidence of myocardial ischemia or infarction.  Other findings as noted.      Gerrit Friends. Dietrich Pates, MD, [facility]     RMR/MEDQ  D:  06/27/2007  T:  06/28/2007  Job:  [id number]

## 2010-12-30 NOTE — H&P (Signed)
Shelly [name], Shelly [name]               ACCOUNT NO.:  [id number]   MEDICAL RECORD NO.:  [id number]          PATIENT TYPE:  OBV   LOCATION:  A213                          FACILITY:  [facility]   PHYSICIAN:  Osvaldo Shipper, MD     DATE OF BIRTH:  June 22, 1961   DATE OF ADMISSION:  06/22/2007  DATE OF DISCHARGE:  LH                              HISTORY & PHYSICAL   PRIMARY CARE PHYSICIAN:  Dr. Charm Barges in [place].   ADMITTING DIAGNOSES:  1. Chest pain, rule out acute coronary syndrome.  2. 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  3. Obesity.  4. Hypothyroidism.  5. Gastroesophageal reflux disease and Barrett's esophagus.   CHIEF COMPLAINT:  Chest pain since last night.   HISTORY OF PRESENTING ILLNESS:  The patient is a 50 year old white  female who is obese who has a history of hypothyroidism secondary to  partial thyroidectomy done for benign tumor who also has history of GERD  with Barrett's esophagus who was in her usual state of health last night  when she was driving back from work when she experienced right-sided  pressure-like sensation.  The pain increased in intensity to 8/10.  The  pain stayed on the right side.  The patient felt lightheaded, no  shortness of breath, did have some palpitations and diaphoresis.  She  waited a few minutes and the pain kind of started going down after about  15 minutes.  The patient could not sleep all night.  This morning when  she woke up she again experienced right-sided pain with no other  radiation.  The pain this morning was about 6/10 and she presented to  the ED.  The patient was given a nitroglycerin pill which decreased the  pain to 1/10.  The patient is feeling very comfortable right now and  does not have significant pain.  She was feeling very anxious about an  hour ago which was also relieved with Xanax.  The patient has never had  similar symptoms in the past.  She has never had a stress test or a  cardiac  catheterization in the past.  She did not have any nausea,  vomiting, no fever, no chills, no cough.   MEDICATIONS AT HOME:  1. Armour Thyroid 75 mg daily.  2. She is on AcipHex 20 mg daily.   ALLERGIES:  Include SULFA, which causes rash.   PAST MEDICAL HISTORY:  Positive for GERD and Barrett's esophagus,  obesity.  She has had a partial thyroidectomy for a benign tumor.  She  has had a cholecystectomy and she has had a liver resection also for  benign tumors.   SOCIAL HISTORY:  Lives in [place] with her husband.  Works in an office  setting.  Smokes a half a pack of cigarettes on a daily basis.  Is a  social drinker and no illicit drug use.  Independent with daily  activities.   FAMILY HISTORY:  Unremarkable.   REVIEW OF SYSTEMS:  GENERAL:  Positive for weakness.  HEENT:  Unremarkable.  CARDIOVASCULAR:  As in HPI.  RESPIRATORY:  Unremarkable.  GI:  Unremarkable.  GU:  Unremarkable.  MUSCULOSKELETAL:  Unremarkable.  NEUROLOGIC:  Unremarkable.  PSYCHIATRIC:  Unremarkable.  DERMATOLOGIC:  Unremarkable.  All other systems unremarkable.   PHYSICAL EXAMINATION:  VITAL SIGNS:  When she presented to the ED,  temperature was 98.1, blood pressure was 137/87, heart rate 81,  respiratory rate 18, saturation 100%.  GENERAL:  This is an obese white female in no distress, anxious.  HEENT:  There is no pallor, no icterus.  Oral mucous membranes is moist.  No oral lesions are noted.  NECK:  Soft and supple.  No thyromegaly is appreciated.  LUNGS:  Clear to auscultation bilaterally.  CARDIOVASCULAR:  S1, S2 is normal, regular.  No murmurs appreciated.  No  S3, S4.  No rubs heard.  ABDOMEN:  Soft, nontender, nondistended, bowel sounds present.  No mass  or organomegaly appreciated.  EXTREMITIES:  Without edema.  Peripheral pulses are palpable.  NEUROLOGIC:  The patient is alert and oriented x3.  No focal  neurological deficits are present.   LABORATORY DATA:  CBC, BMET, cardiac markers all  negative.   Chest x-ray showed no abnormalities.  CT angiogram showed no PE or  dissection but it did show a 5-cm pericardial lesion thought to be a  benign pericardial cyst; MRI was recommended.   EKG shows a sinus rhythm with a normal axis, intervals appear to be in  the normal range.  There is some evidence for T-wave inversion in lead  III, possible tiny Q-wave in lead III as well probably not of much  significance.  No other ST or T wave abnormality noted on EKG.  No old  EKG available to compare.   ASSESSMENT:  This is a 50 year old obese Caucasian female with a history  of hypothyroidism who presents with chest pain starting last night.  The  etiology for this pain could be acute coronary syndrome, though it is  very less likely at this time.  Esophageal spasm is probably high on the  differential.  Musculoskeletal etiology also to be considered.  I am not  sure if the pericardial cyst seen on the CT angiogram could have a role  to play or not, just not aware.  MRI has been recommended and I think we  may end up getting that done as well.   PLAN:  1. Chest pain, rule her out for acute coronary syndrome by serial      cardiac markers.  EKG will be repeated.  Lipid profile will be      checked.  We will give her baby aspirin on a daily basis.  Because      of this abnormality of pericardial cyst, I think we will go ahead      and consult [facility] to see what they have to think about      this issue.  We will also consider getting an echocardiogram as      that may delineate this lesion better.  2. Continue AcipHex for her GERD.  Continue Armour Thyroid as well.      DVT prophylaxis.  Xanax for anxiety, Ambien for insomnia.  Thyroid      function tests will be checked.   Further management decision will depend on results of further testing  and the patient's response to treatment.      Osvaldo Shipper, MD  Electronically Signed     GK/MEDQ  D:  06/22/2007  T:   06/22/2007  Job:  [id number]   cc:  Samuel Jester  Fax: [phone]   Gerrit Friends. Dietrich Pates, MD, [facility]  [address]  [place], [place] [address]

## 2010-12-30 NOTE — Procedures (Signed)
NAMEANNETE, AYUSO               ACCOUNT NO.:  [id number]   MEDICAL RECORD NO.:  [id number]          PATIENT TYPE:  OUT   LOCATION:  RAD                           FACILITY:  [facility]   PHYSICIAN:  Gerrit Friends. Dietrich Pates, MD, FACCDATE OF BIRTH:  Sep 07, 1960   DATE OF PROCEDURE:  06/23/2007  DATE OF DISCHARGE:                                ECHOCARDIOGRAM   REFERRING PHYSICIAN:  Dr. Rito Ehrlich.   CLINICAL DATA:  50 year old woman admitted to hospital with chest pain.   1. Treadmill exercise performed to a workload of 7 METS and a heart      rate of 155, 90% of age-predicted maximum.  Exercise discontinued      due to fatigue; no chest pain reported.  2. Blood pressure increased from a resting value of 125/80 to 170/70      at peak exercise, a normal response.  3. No arrhythmias noted.  4. Resting EKG:  Normal sinus rhythm; borderline left atrial      abnormality; nondiagnostic inferior Q waves; minor nonspecific T-      wave abnormality.  5. Stress EKG:  Insignificant upsloping ST-segment depression.  6. Resting echocardiogram:  Technically suboptimal; normal mitral and      aortic valves; borderline right and left ventricular hypertrophy;      normal left atrial size; normal left ventricular size and function.  7. Post-exercise echocardiogram:  Very suboptimal images; there      appears to be cavity obliteration during systole; no segmental wall      motion abnormality is appreciated.   IMPRESSION:  Negative stress echocardiogram revealing impaired exercise  capacity, a rapid increase in heart rate at a low-level of exercise  consistent with physical deconditioning, a normal blood pressure  response, and neither electrocardiographic nor echocardiographic  evidence of myocardial ischemia or infarction.  Other findings as noted.      Gerrit Friends. Dietrich Pates, MD, [facility]  Electronically Signed     RMR/MEDQ  D:  06/27/2007  T:  06/28/2007  Job:  [id number]

## 2011-01-01 ENCOUNTER — Ambulatory Visit: Payer: BC Managed Care – PPO | Attending: Orthopedic Surgery | Admitting: Physical Therapy

## 2011-01-01 DIAGNOSIS — M25579 Pain in unspecified ankle and joints of unspecified foot: Secondary | ICD-10-CM | POA: Insufficient documentation

## 2011-01-01 DIAGNOSIS — M25676 Stiffness of unspecified foot, not elsewhere classified: Secondary | ICD-10-CM | POA: Insufficient documentation

## 2011-01-01 DIAGNOSIS — M25673 Stiffness of unspecified ankle, not elsewhere classified: Secondary | ICD-10-CM | POA: Insufficient documentation

## 2011-01-01 DIAGNOSIS — R262 Difficulty in walking, not elsewhere classified: Secondary | ICD-10-CM | POA: Insufficient documentation

## 2011-01-01 DIAGNOSIS — IMO0001 Reserved for inherently not codable concepts without codable children: Secondary | ICD-10-CM | POA: Insufficient documentation

## 2011-01-01 DIAGNOSIS — R5381 Other malaise: Secondary | ICD-10-CM | POA: Insufficient documentation

## 2011-01-02 NOTE — Op Note (Signed)
NAMEIEISHA, GAO               ACCOUNT NO.:  [id number]   MEDICAL RECORD NO.:  [id number]          PATIENT TYPE:  AMB   LOCATION:  DAY                           FACILITY:  [facility]   PHYSICIAN:  Lionel December, M.D.    DATE OF BIRTH:  September 26, 1960   DATE OF PROCEDURE:  08/03/2005  DATE OF DISCHARGE:                                 OPERATIVE REPORT   PROCEDURE:  Esophagogastroduodenoscopy.   INDICATIONS:  Sumaiyah is a 50 year old Caucasian female with refractory GERD  who underwent EGD in March this year and showed short segment or patch of  Barrett's. Biopsy confirmed this, but she had a lot of inflammation.  Therefore could not tell whether not she had dysplasia. She is therefore  undergoing repeat EGD with biopsy. She is also complaining of intermittent  solid food dysphagia. She states she is doing much better with Aciphex 20 mg  q.d., and on some days, she has to take an additional dose. Procedure and  risks were reviewed with the patient, and informed consent was obtained.   MEDICATIONS FOR CONSCIOUS SEDATION:  Cetacaine spray for pharyngeal topical  anesthesia, Demerol 50 mg IV, Versed 10 mg IV.   FINDINGS:  Procedure performed in endoscopy suite. The patient's vital signs  and O2 saturation were monitored during the procedure and remained stable.  The patient was placed in the left lateral position, and Olympus videoscope  was passed via oropharynx without any difficulty into esophagus.   Esophagus. Mucosa of the esophagus normal. She had incomplete Schatzki's  ring at GE junction which was 33 cm. There was a  patch of Barrett's mucosa  on right side which was biopsied for routine histology. No erosions or  inflammation was noted. Hiatus was at 36 to 37 cm from the incisors. Hiatus  was wide open.   Stomach. It was empty and distended very well with insufflation. Folds of  proximal stomach were normal. Examination of mucosa at body, antrum, pyloric  channel as well as  angularis, fundus and cardia was normal.   Duodenum. Bulbar mucosa was normal. Scope was passed into the second part of  duodenum where mucosa and folds were normal. Endoscope was withdrawn. Biopsy  was taken from this patch of Barrett's mucosa for routine histology. The  ring was disrupted at two sites via focal biopsy. Endoscope was withdrawn.  The patient tolerated the procedure well.   FINAL DIAGNOSIS:  1.  No endoscopic evidence of esophagitis. A patch of Barrett's mucosa no      more than 1 x 2 cm was biopsied for histology.  2.  Schatzki's ring which was disrupted by a focal biopsy.  3.  Normal examination of stomach and first and second part of duodenum.   RECOMMENDATIONS:  She will continue anti-reflux measures and Aciphex as  before. I will be contacting the patient with biopsy results. Biopsy is  negative for dysplasia. May consider repeat EGD in three years.      Lionel December, M.D.  Electronically Signed     NR/MEDQ  D:  08/03/2005  T:  08/04/2005  Job:  [id number]

## 2011-01-02 NOTE — Op Note (Signed)
Shelly [name], Shelly [name]               ACCOUNT NO.:  [id number]   MEDICAL RECORD NO.:  [id number]          PATIENT TYPE:  AMB   LOCATION:  DAY                           FACILITY:  [facility]   PHYSICIAN:  Lionel December, M.D.    DATE OF BIRTH:  September 26, 1960   DATE OF PROCEDURE:  11/05/2004  DATE OF DISCHARGE:                                 OPERATIVE REPORT   PROCEDURE:  Esophagogastroduodenoscopy.   INDICATION:  Scarlette is a 50 year old Caucasian female with known chronic  GERD, who presents with persistent heartburn, regurgitation and hoarseness.  Symptoms are worse at night.  Her symptoms are worse at night when she wakes  up with burning in her chest, neck and throat area.  It feels like she is  choking.  She had occasional odynophagia.  Her last EGD was in November  2004.  She had a moderate-sized hiatal hernia and a small patch of gastric-  type mucosa suspicious of Barrett's but was not biopsied.  The plan was for  her to come back in November this year.  She is on an antireflux measures  and Nexium 40 mg b.i.d., Reglan 10 mg q.i.d., Carafate and the Zantac at  bedtime and still very symptomatic.  She is undergoing EGD to reevaluate her  symptomatology.   The procedure risks were reviewed with the patient and informed consent was  obtained.   PREMEDICATION:  Cetacaine spray for pharyngeal topical anesthesia, Demerol  50 mg IV, Versed 10 mg IV in divided dose.   FINDINGS:  Procedure performed in endoscopy suite.  The patient's vital  signs and O2 saturation were monitored during procedure and remained stable.  The patient was in the placed left lateral position.  The Olympus video  scope was passed via oropharynx without any difficulty into esophagus.   Mucosa of the esophagus was normal.  Squamocolumnar junction was at 31 cm  from the incisors.  There was a small patch of erythematous mucosa.  This actually appeared a  little bit smaller than on her previous exam.  There appeared to be  a  noncritical ring at GE junction.  Hiatus was at 36 cm.  She had moderate-  sized sliding hiatal hernia.   Stomach:  It was empty and distended very well with insufflation.  Folds of  the proximal stomach were normal.  Examination of mucosa at body, antrum,  pyloric channel as well as angularis, fundus and cardia was normal.  Hernia  was easily seen on retroflexed view.   Duodenum:  Bulbar mucosa was normal.  The scope was passed second part of  the duodenum, where mucosa and folds were normal.  Endoscope was withdrawn.   A biopsy was taken from this patch of erythematous mucosa at GE junction on  the way out.  Endoscope was withdrawn.  The patient tolerated the procedure well.   FINAL DIAGNOSES:  1.  Small, less than 1 cm, patch of erythematous mucosa at gastroesophageal      junction, which was biopsied for histology. This may or may not be a      Barrett's patch.  2.  Noncritical Schatzki's ring, which is a new finding since the exam of      November 2004.  3.  Moderate-sized sliding hiatal hernia.  4.  Normal examination of stomach, first and second part of the duodenum.   RECOMMENDATIONS:  1.  She will continue present therapy for now.  2.  I will contact the patient with biopsy results.  3.  I feel that she would be candidate for antireflux surgery, and we would      make plans for her to be seen by Dr. Marilynn Rail (whom she seen in the      past) or one of his colleagues.  She will need preop esophageal      manometry, which will be done after she has been seen by a surgeon      plus/minus pH study, but this will be deferred until she has had      surgical consultation.      NR/MEDQ  D:  11/05/2004  T:  11/06/2004  Job:  [id number]   cc:   Leo [id number]  [place]  [place] [address]  Fax: [phone]

## 2011-01-02 NOTE — Discharge Summary (Signed)
[place]  Patient:    Shelly [name], Shelly [name]                       MRN: [id number] Adm. Date:  [id number] Disc. Date: [id number] Attending:  Barbee Cough                           Discharge Summary  ADMISSION DIAGNOSIS:  Left thyroid mass.  DISCHARGE DIAGNOSIS:  Left thyroid mass.  SURGICAL PROCEDURE THIS ADMISSION:  Left hemithyroidectomy performed on October 06, 2000.  DISPOSITION:  Patient is discharged to home in stable condition in the care of her family.  DISCHARGE MEDICATIONS: 1. Lortab 5/500 1-2 tablets q.4-6h. as needed for pain, dispense #30 without    refills. 2. Augmentin 500 mg p.o. b.i.d. for 10 days. 3. Vioxx 50 mg p.o. q.d. for 10 days.  OTHER MEDICATIONS:  In addition to the above discharge medications, she will continue on her preoperative medications, which include. 1. Nexium 40 mg p.o. q.h.s. 2. Valium 2.5 mg p.o. q.d. p.r.n.  ACTIVITY:  Patient is discharged to home with limited activity - no lifting, straining, or vigorous exercise.  DIET:  There are no dietary restrictions.  WOUND CARE:  Half-strength hydrogen peroxide followed by Bacitracin ointment to the neck incision twice daily.  FOLLOW-UP:  Patient is scheduled to follow up in my office in one week for postoperative care.  BRIEF HISTORY:  Shelly [name] is a 50 year old white female who is referred for evaluation of an enlarging mass in the left aspect of the thyroid gland with symptoms of dysphagia, pressure, and mild shortness of breath.  Preoperative workup included ultrasonography, radionuclide scanning, and MRI of the thyroid region.  This showed an approximately 4 cm mass involving the posterior aspect of the left thyroid lobe with extension of the substernal space and significant deviation of the trachea and compression of the esophagus. Radionuclide scan was cold and the mass appeared to be heterogenous. Fine-needle aspiration was not performed  because of the location of the mass, and based on her history and progressive symptoms I recommended that we undertake left hemithyroidectomy for management of her symptoms and pathologic diagnosis.  The risks, benefits, and possible complications of this surgical procedure were discussed in detail with the patient and her family; they understood and concurred with our plan for surgery.  HOSPITAL COURSE:  The patient is admitted to the ENT service at [facility] under Dr. Clovis Pu care on October 06, 2000.  She was taken to the operating room and underwent a left hemithyroidectomy under general anesthesia.  The surgical procedure was uneventful was she was transferred from the operating room to recovery, and from recovery to unit 5700 for postoperative care.  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  The patient was admitted to unit 5700 for postoperative care.  A Blake drain was placed at the time of her surgical procedure and output was closely monitored.  By the postoperative day #2 the output had fallen and the drain was removed without difficulty.  The patient was tolerating liquid and soft oral diet without difficulty, having no obstruction, and the incision was dry and intact without evidence of erythema, bleeding, swelling, or infection.  The patient was discharged to home on October 08, 2000 in stable condition with the above discharge instructions  in the company of her family.  She was comfortable with this discharge plan and will follow up in my office in one week for further postoperative care and final review of her pathologic diagnosis.DD:  11/29/00 TD:  11/29/00 Job: [id number]

## 2011-01-02 NOTE — Op Note (Signed)
[facility]  Patient:    Shelly [name], Shelly [name]                         MRN: [id number] Proc. Date: 10/06/00 Adm. Date:  10/06/00 Attending:  Kinnie Scales. Annalee Genta, M.D.                           Operative Report  PREOPERATIVE DIAGNOSES: 1. Left thyroid mass. 2. Dysphagia.  POSTOPERATIVE DIAGNOSES: 1. Left thyroid mass. 2. Dysphagia.  INDICATION FOR PROCEDURE: 1. Left thyroid mass. 2. Dysphagia.  PROCEDURE:  Left hemithyroidectomy.  ANESTHESIA:  General endotracheal.  SURGEON:  Kinnie Scales. Annalee Genta, M.D.  ASSISTANT:  Veverly Fells. Arletha Grippe, M.D.  COMPLICATIONS:  None.  ESTIMATED BLOOD LOSS:  100 cc.  DISPOSITION:  Patient transferred from the operating room to the recovery room in stable condition.  BRIEF HISTORY:  Shelly [name] is a 50 year old white female who was referred for evaluation of a large posterior left thyroid mass.  Previous workup, including MRI scan performed by her primary care physician, showed incidental finding of a large, approximately 4 cm mass in the posterior inferior aspect of the left thyroid gland with substernal extension.  Radionuclide scan and thyroid ultrasound showed a solid, heterogeneous mass, which was cold on radionuclide scan.  Based on this history and the patients findings, which included fullness in the neck and deviation of the trachea with sensation of dysphagia, she was scheduled for left hemithyroidectomy, possible total thyroidectomy. The risks, benefits, and possible complications of the procedure were discussed in detail with the patient and her family, who understood and concurred with our plan for surgery.  DESCRIPTION OF PROCEDURE:  The patient was brought to the operating room on October 06, 2000, and placed in the supine position on the operating table. General endotracheal anesthesia was established without difficulty.  When the patient was adequately anesthetized, she was injected with 5 cc of  1% lidocaine and 1:100,000 solution of epinephrine, injecting the subcutaneous tissue in the anterior neck skin.  The patient was then prepped and draped in a sterile fashion.  A NIMS (nerve integrity monitor system) endotracheal tube was placed to establish general anesthesia, and there was good response on the nerve monitor for the recurrent laryngeal nerve bilaterally.  Once the patient had been prepped and draped, the surgical procedure was begun by creating a 6 cm horizontal neck incision.  This was created in a pre-existing skin crease and carried through the skin with a #15 scalpel blade.  The deep subcutaneous tissue was then dissected.  Several blood vessels were divided and suture ligated.  The anterior aspect of the platysma muscle was divided, and a subplatysmal flap was developed superiorly and inferiorly.  Inferiorly this was extended to the level of the sternum, superiorly to the level of the larynx.  The strap muscles were identified in the midline and divided.  They were then lateralized.  This allowed direct access to the midline of the neck, including the thyroid gland and trachea.  The strap muscles were then gently reflected laterally, exposing the entire left portion of the thyroid gland. 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  The middle thyroid vein was identified and divided and suture ligated.  The superior pole of the left thyroid lobe was then dissected.  Then the superior thyroid artery and vein were identified and divided and suture ligated with 2-0 silk suture ligature, with the superior aspect of  the thyroid gland reflected medially.  Attention was then turned to the inferior portion, where the large mass was identified in the posterior and inferior aspect.  This was elevated using blunt finger dissection and reflected well into the neck.  Attention was then turned to the inferior thyroid vessels, which were individually identified and divided as the entered the gland.   Inferior parathyroid gland was identified and preserved.  The recurrent laryngeal nerve was also identified within the tracheoesophageal groove.  This was identified by direct visualization, and nerve stimulation confirmed the location of the nerve.  We then dissected superiorly/distally to the level of the larynx, reflecting the overlying thyroid gland and dividing and ligating blood vessels in order to preserve the nerve throughout its entire course.  The lateral component of the gland was then reflected over the recurrent laryngeal nerve, and Berrys ligament was then reflected off the trachea.  The thyroid isthmus was divided and suture ligated with 2-0 chromic suture, and the entire left thyroid gland, including the large mass in the posterior aspect, was sent to pathology for gross microscopic evaluation. 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  Final diagnosis was deferred to permanent pathology.  The patients wound was then thoroughly irrigated with saline solution.  A small piece of half by two-inch Surgicel was placed over the recurrent laryngeal nerve, and a 7 mm Blake drain was placed at the base of the incision in the left neck.  Careful inspection of the surgical site was undertaken.  There was no active bleeding.  Valsalva maneuver was performed by the anesthesiologist, and again no active bleeding was noted.  Carotid artery and jugular vein were identified, and there was no evidence of bleeding within the vascular compartment.  Again the anterior neck was thoroughly irrigated after placement of the 7 mm Blake drain.  Strap muscles were approximated in the midline with a 3-0 Vicryl suture.  The platysma muscle and deep cutaneous tissues were then reapproximated with a 4-0 Vicryl suture.  The final skin closure was achieved using a 5-0 Ethilon in a running locked fashion.  The wound was then cleaned and dressed with bacitracin ointment.  The patient was  awakened from anesthetic, extubated, and transferred from the operating room to the recovery room in stable condition. There were no complications.  Estimated blood loss was approximately 100 cc.  DD:  10/06/00 TD:  10/06/00 Job: [id number]

## 2011-01-02 NOTE — Op Note (Signed)
NAME:  Shelly [name], Shelly [name]                          ACCOUNT NO.:  [id number]   MEDICAL RECORD NO.:  [id number]                   PATIENT TYPE:  AMB   LOCATION:  DAY                                  FACILITY:  [facility]   PHYSICIAN:  Lionel December, M.D.                 DATE OF BIRTH:  July 09, 1961   DATE OF PROCEDURE:  07/06/2003  DATE OF DISCHARGE:                                 OPERATIVE REPORT   PROCEDURE:  Esophagogastroduodenoscopy.   INDICATIONS FOR PROCEDURE:  Shelly [name] is a 50 year old Caucasian female who has  chronic GERD complicated by short-segment Barrett's esophagus who continues  to have epigastric pain.  She also has right upper quadrant pain and has  been extensively evaluated, and she has a large focal nodular hyperplastic  lesion in the left lobe of the liver and is scheduled for surgery at [facility]  on 07/18/2003.  The procedure risks were reviewed with the patient, and  informed consent for the procedure was obtained.   PREOPERATIVE MEDICATIONS:  Cetacaine spray for oropharyngeal topical  anesthesia, Demerol 50 mg IV, Versed 10 mg IV in divided doses.   FINDINGS:  The procedure was performed in the endoscopy suite.  The  patient's vital signs and O2 saturations were monitored during the procedure  and remained stable.  The patient was placed in the left lateral recumbent  position, and the Olympus videoscope was passed via the oropharynx without  any difficulty into the esophagus.   Esophagus:  The mucosa of the esophagus was normal, except distally where  she had a single patch of Barrett's-type mucosa which was less than 1 cm in  diameter, but it had erythema.  There was no ulceration or mass.  The GE  junction was located at 33 cm from the incisors.  There was a small to  moderate-size sliding hiatal hernia.  The diaphragmatic hiatus was at 37 to  38 cm and was wide open.  The mucosa of the herniated part of the stomach  was normal.   Stomach:  It was empty and distended  very well with insufflation.  The folds  of the proximal stomach were normal.  Examination of  the mucosa at the  gastric body, antrum, pyloric channel, as well as angularis, fundus, and  cardia was normal.   Duodenum:  Examination of the bulb and second part of the duodenum was  normal.  The endoscope was withdrawn.  The patient tolerated the procedure  well.   FINAL DIAGNOSES:  1. Short-segment Barrett's esophagus with mild change of reflux esophagitis     limited to the junction of Barrett's and squamous epithelium.  2. Small to moderate-size sliding hiatal hernia.  3. Normal examination of the stomach, first and second part of the duodenum.     No biopsy was taken today.   RECOMMENDATIONS:  1. She will continue antireflux measures.  I would like for her to  increase     her Aciphex to 20 mg b.i.d. for four months and then drop it back to 20     mg q.a.m.  2. Will consider repeating her EGD in two years from now.      ___________________________________________                                            Lionel December, M.D.   NR/MEDQ  D:  07/06/2003  T:  07/06/2003  Job:  [id number]   cc:   Colon Flattery, MD  [address]  [place]  [place] [address]  Fax: [phone]   Rockie Neighbours, M.D.  Dept. of Surgery at [facility]

## 2011-01-06 ENCOUNTER — Ambulatory Visit: Payer: BC Managed Care – PPO | Admitting: *Deleted

## 2011-01-08 ENCOUNTER — Ambulatory Visit: Payer: BC Managed Care – PPO | Admitting: Physical Therapy

## 2011-01-13 ENCOUNTER — Ambulatory Visit: Payer: BC Managed Care – PPO | Admitting: *Deleted

## 2011-01-15 ENCOUNTER — Ambulatory Visit: Payer: BC Managed Care – PPO | Admitting: *Deleted

## 2011-01-20 ENCOUNTER — Ambulatory Visit: Payer: BC Managed Care – PPO | Attending: Orthopedic Surgery | Admitting: *Deleted

## 2011-01-20 DIAGNOSIS — R5381 Other malaise: Secondary | ICD-10-CM | POA: Insufficient documentation

## 2011-01-20 DIAGNOSIS — M25676 Stiffness of unspecified foot, not elsewhere classified: Secondary | ICD-10-CM | POA: Insufficient documentation

## 2011-01-20 DIAGNOSIS — R262 Difficulty in walking, not elsewhere classified: Secondary | ICD-10-CM | POA: Insufficient documentation

## 2011-01-20 DIAGNOSIS — M25673 Stiffness of unspecified ankle, not elsewhere classified: Secondary | ICD-10-CM | POA: Insufficient documentation

## 2011-01-20 DIAGNOSIS — IMO0001 Reserved for inherently not codable concepts without codable children: Secondary | ICD-10-CM | POA: Insufficient documentation

## 2011-01-20 DIAGNOSIS — M25579 Pain in unspecified ankle and joints of unspecified foot: Secondary | ICD-10-CM | POA: Insufficient documentation

## 2011-01-22 ENCOUNTER — Ambulatory Visit: Payer: BC Managed Care – PPO | Admitting: *Deleted

## 2011-01-27 ENCOUNTER — Ambulatory Visit: Payer: BC Managed Care – PPO | Admitting: Physical Therapy

## 2011-01-29 ENCOUNTER — Ambulatory Visit: Payer: BC Managed Care – PPO | Admitting: Physical Therapy

## 2011-02-03 ENCOUNTER — Ambulatory Visit: Payer: BC Managed Care – PPO | Admitting: *Deleted

## 2011-02-05 ENCOUNTER — Ambulatory Visit: Payer: BC Managed Care – PPO | Admitting: *Deleted

## 2011-02-10 ENCOUNTER — Ambulatory Visit: Payer: BC Managed Care – PPO | Admitting: *Deleted

## 2011-02-12 ENCOUNTER — Ambulatory Visit: Payer: BC Managed Care – PPO | Admitting: *Deleted

## 2011-03-05 ENCOUNTER — Encounter (HOSPITAL_COMMUNITY)
Admission: RE | Disposition: A | Discharge status: Home / Self Care | Payer: Self-pay | Source: Ambulatory Visit | Attending: Internal Medicine

## 2011-03-05 ENCOUNTER — Encounter (HOSPITAL_COMMUNITY): Payer: Self-pay | Admitting: *Deleted

## 2011-03-05 ENCOUNTER — Encounter (INDEPENDENT_AMBULATORY_CARE_PROVIDER_SITE_OTHER): Payer: BC Managed Care – PPO | Admitting: Internal Medicine

## 2011-03-05 ENCOUNTER — Other Ambulatory Visit (INDEPENDENT_AMBULATORY_CARE_PROVIDER_SITE_OTHER): Payer: Self-pay | Admitting: Internal Medicine

## 2011-03-05 ENCOUNTER — Ambulatory Visit (HOSPITAL_COMMUNITY)
Admission: RE | Admit: 2011-03-05 | Discharge: 2011-03-05 | Disposition: A | Discharge status: Home / Self Care | Payer: BC Managed Care – PPO | Source: Ambulatory Visit | Attending: Internal Medicine | Admitting: Internal Medicine

## 2011-03-05 DIAGNOSIS — D126 Benign neoplasm of colon, unspecified: Secondary | ICD-10-CM | POA: Insufficient documentation

## 2011-03-05 DIAGNOSIS — K219 Gastro-esophageal reflux disease without esophagitis: Secondary | ICD-10-CM

## 2011-03-05 DIAGNOSIS — D128 Benign neoplasm of rectum: Secondary | ICD-10-CM | POA: Insufficient documentation

## 2011-03-05 DIAGNOSIS — K625 Hemorrhage of anus and rectum: Secondary | ICD-10-CM

## 2011-03-05 DIAGNOSIS — K208 Other esophagitis without bleeding: Secondary | ICD-10-CM

## 2011-03-05 DIAGNOSIS — Z860100 Personal history of colon polyps, unspecified: Secondary | ICD-10-CM | POA: Insufficient documentation

## 2011-03-05 DIAGNOSIS — K2270 Barrett's esophagus without dysplasia: Secondary | ICD-10-CM | POA: Insufficient documentation

## 2011-03-05 DIAGNOSIS — K449 Diaphragmatic hernia without obstruction or gangrene: Secondary | ICD-10-CM

## 2011-03-05 HISTORY — DX: Other specified diseases of liver: K76.89

## 2011-03-05 HISTORY — PX: ESOPHAGOGASTRODUODENOSCOPY: SHX5428

## 2011-03-05 HISTORY — PX: COLONOSCOPY: SHX5424

## 2011-03-05 HISTORY — DX: Gastro-esophageal reflux disease without esophagitis: K21.9

## 2011-03-05 HISTORY — DX: Hypothyroidism, unspecified: E03.9

## 2011-03-05 SURGERY — EGD (ESOPHAGOGASTRODUODENOSCOPY)
Anesthesia: Moderate Sedation

## 2011-03-05 MED ORDER — SODIUM CHLORIDE 0.45 % IV SOLN
Freq: Once | INTRAVENOUS | Status: AC
  Administered 2011-03-05: 13:00:00 via INTRAVENOUS

## 2011-03-05 MED ORDER — PROMETHAZINE HCL 25 MG/ML IJ SOLN
INTRAMUSCULAR | Status: AC
  Filled 2011-03-05: qty 1

## 2011-03-05 MED ORDER — BUTAMBEN-TETRACAINE-BENZOCAINE 2-2-14 % EX AERO
INHALATION_SPRAY | CUTANEOUS | Status: DC | PRN
  Administered 2011-03-05: 2 via TOPICAL

## 2011-03-05 MED ORDER — MIDAZOLAM HCL 5 MG/5ML IJ SOLN
INTRAMUSCULAR | Status: AC
  Filled 2011-03-05: qty 10

## 2011-03-05 MED ORDER — MEPERIDINE HCL 25 MG/ML IJ SOLN
INTRAMUSCULAR | Status: DC | PRN
  Administered 2011-03-05 (×2): 25 mg via INTRAVENOUS

## 2011-03-05 MED ORDER — MEPERIDINE HCL 50 MG/ML IJ SOLN
INTRAMUSCULAR | Status: AC
  Filled 2011-03-05: qty 1

## 2011-03-05 MED ORDER — STERILE WATER FOR IRRIGATION IR SOLN
Status: DC | PRN
  Administered 2011-03-05: 13:00:00

## 2011-03-05 MED ORDER — MIDAZOLAM HCL 5 MG/5ML IJ SOLN
INTRAMUSCULAR | Status: DC | PRN
  Administered 2011-03-05 (×6): 2 mg via INTRAVENOUS

## 2011-03-05 NOTE — Brief Op Note (Signed)
03/05/2011  3:45 PM  PATIENT:  Shelly [name]  50 y.o. female  PRE-OPERATIVE DIAGNOSIS:  Surveillance,GERD,History of Polyps, History of rectal bleeding  POST-OPERATIVE DIAGNOSIS:  Short Segment Barretts Transverse Colon Polyp Rectal Sigmoid Polyp Internal Hemorrhoids  PROCEDURE:  Procedure(s): ESOPHAGOGASTRODUODENOSCOPY (EGD) COLONOSCOPY  SURGEON:  Surgeon(s): Malissa Hippo, MD  EGD:  wavy GEJ at 33 cms. Biopsy taken. 3 cm size hiatal hernia. Colonoscopy: Polyp at Tr. Colon removed via cold biopsy. 3 mm polyp at rectosigmoid colon coagulated. 7 mm polyp at rectosigmoid  Snared. External hemorrhoids.

## 2011-03-05 NOTE — H&P (Signed)
Shelly [name] is an 50 y.o. female.   Chief Complaint: For EGD and Colonoscopy  HPI: patient has chronic GERd complicated by short segment Barretts; last EGD in 2006. She has h/o colonic polyps; last exam in 2009.  Past Medical History  Diagnosis Date  . Hypothyroidism   . GERD (gastroesophageal reflux disease)   . Acid reflux   . Focal nodular hyperplasia of liver surgery in 2004    partial hepactectomy    Past Surgical History  Procedure Date  . Partial hysterectomy   . Thyroidectomy, partial   . Carpal tunnel release   . Tibia hardware removal     Family History  Problem Relation Age of Onset  . Anesthesia problems Neg Hx   . Hypotension Neg Hx   . Malignant hyperthermia Neg Hx   . Pseudochol deficiency Neg Hx    Social History:  reports that she has been smoking Cigarettes.  She has a .125 pack-year smoking history. She does not have any smokeless tobacco history on file. She reports that she drinks alcohol. She reports that she does not use illicit drugs.  Allergies:  Allergies  Allergen Reactions  . Nsaids   . Sulfa Antibiotics     Medications Prior to Admission  Medication Dose Route Frequency Provider Last Rate Last Dose  . 0.45 % sodium chloride infusion   Intravenous Once Malissa Hippo, MD 20 mL/hr at 03/05/11 1300    . butamben-tetracaine-benzocaine (CETACAINE) spray    PRN Malissa Hippo, MD   2 spray at 03/05/11 1300  . meperidine (DEMEROL) 50 MG/ML injection           . meperidine (DEMEROL) injection    PRN Malissa Hippo, MD   25 mg at 03/05/11 1300  . midazolam (VERSED) 5 MG/5ML injection           . midazolam (VERSED) 5 MG/5ML injection    PRN Malissa Hippo, MD   2 mg at 03/05/11 1300   Medications Prior to Admission  Medication Sig Dispense Refill  . amitriptyline (ELAVIL) 25 MG tablet Take 25 mg by mouth at bedtime.        Marland Kitchen esomeprazole (NEXIUM) 20 MG capsule Take 20 mg by mouth daily before breakfast.        . gabapentin (NEURONTIN) 300  MG capsule Take 300 mg by mouth 2 (two) times daily at 10 am and 4 pm.        . thyroid (ARMOUR) 60 MG tablet Take 60 mg by mouth daily.          No results found for this or any previous visit (from the past 48 hour(s)). No results found.  Review of Systems  Constitutional: Negative for fever and weight loss.  Gastrointestinal: Positive for blood in stool (intermittent hematochezia). Negative for heartburn, nausea, vomiting, abdominal pain and melena.    Blood pressure 126/85, pulse 82, temperature 97.9 F (36.6 C), temperature source Oral, resp. rate 22, height 5' 6.5" (1.689 m), weight 95.255 kg (210 lb), SpO2 96.00%. Physical Exam  Constitutional: She appears well-developed and well-nourished.  HENT:  Mouth/Throat: Oropharynx is clear and moist.  Eyes: Conjunctivae are normal. No scleral icterus.  Neck: Neck supple. No thyromegaly present.  Cardiovascular: Normal rate, regular rhythm and normal heart sounds.   No murmur heard. Respiratory: Breath sounds normal.  GI: She exhibits no distension. There is tenderness (mild midepigastric tenderness). There is no rebound.  Musculoskeletal: She exhibits no edema.  Lymphadenopathy:  She has no cervical adenopathy.  Neurological: She is alert.  Skin: Skin is warm and dry.     Assessment/Plan Ch. GERD with Barretts. History of colonic polyps.  REHMAN,NAJEEB U 03/05/2011, 1:01 PM

## 2011-03-05 NOTE — Op Note (Signed)
NAMEAARIAN, Shelly [name]               ACCOUNT NO.:  [id number]  MEDICAL RECORD NO.:  [id number]  LOCATION:  [facility]                          FACILITY:  [facility]  PHYSICIAN:  Lionel December, M.D.    DATE OF BIRTH:  23-Jul-1961  DATE OF PROCEDURE:  03/05/2011 DATE OF DISCHARGE:                              OPERATIVE REPORT   PROCEDURE:  Esophagogastroduodenoscopy followed by colonoscopy with polypectomy.  INDICATION:  Shelly [name] is a 50 year old Caucasian female who has chronic GERD, complicated by short segment Barrett esophagus.  However, on her most recent EGD of December 2006.  Biopsy was negative.  She is currently on antireflux measures and Nexium 20 mg daily and doing well. She has history of colonic polyps.  The last exam was 3 years ago and she has recurrent hematochezia at least once or twice a month.  She is undergoing EGD and colonoscopy both for diagnostic and surveillance purposes.  Procedure risks were reviewed with the patient and informed consent was obtained.  MEDICATIONS FOR CONSCIOUS SEDATION:  Cetacaine spray for pharyngeal topical anesthesia, Demerol 50 mg IV, Versed 12 mg IV.  FINDINGS:  Procedure was performed in endoscopy suite.  The patient's vital signs and O2 sat were monitored during the procedure.  The patient remained stable.  PROCEDURE: 1. Esophagogastroduodenoscopy.  The patient was placed in left lateral     recumbent position and Pentax videoscope was passed through     oropharynx without any difficulty into esophagus. 2. Esophagus, mucosa of the esophagus was normal.  GE junction was     located at 33 cm and wavy or serrated.  There was no obvious patch     of Barrett mucosa.  On the way out, biopsy was taken from this     serrated GE junction.  Hiatus was wide open at 36 cm from the     incisors. 3. Stomach, it was empty and distended very well insufflation.  Folds     of proximal stomach are normal.  Examination of mucosa at body,     antrum, pyloric  channel as well as angularis, fundus and cardia was     normal. 4. Duodenum.  Bulbar mucosa was normal.  Scope was passed into second     part of duodenal mucosa and folds were normal.  Endoscope was     withdrawn and as noted above, biopsy was taken from GE junction and     the patient prepared for procedure #2.  Colonoscopy, rectal examination performed.  No abnormality noted on external or digital exam.  Pentax videoscope was placed through rectum and advanced under vision at sigmoid colon and beyond.  Preparation was excellent.  Scope was passed into cecum which was identified by appendiceal orifice and ileocecal valve.  As the scope was gradually withdrawn, mucosa was carefully examined.  There was a 4 x 6-mm polyp at optimal transverse colon.  It was ablated via cold biopsy.  There was a 3-mm polyp at rectosigmoid junction which was coagulated using snare and there was another 7-mm polyp at rectosigmoid junction which was snared. Part of the polyp was retrieved with the staff, the polyp was coagulated using this process.  Rectal mucosa  was normal.  Scope was retroflexed to examine anorectal junction and hemorrhoids noted below the dentate line. Endoscope was then withdrawn.  Withdrawal time was 13 minutes.  The patient tolerated the procedures well.  FINAL DIAGNOSES: 1. Wavy or serrated GE junction but no obvious Barrett mucosa noted.     Biopsy taken from GE junction. 2. A 3-cm size sliding hiatal hernia. 3. Colonoscopy completed to cecum. 4. Small polyp ablated via cold biopsy from proximal transverse colon. 5. A 3-mm polyp at rectosigmoid junction was coagulated using snare     tip. 6. A 7-mm polyp snared from rectal sigmoid junction. 7. External hemorrhoids which would explain patient's intermittent     hematochezia.  RECOMMENDATIONS: 1. Standard instructions given. 2. No aspirin for 1 week. 3. High-fiber diet.  I will be contacting the patient results of biopsy and  further recommendations.          ______________________________ Lionel December, M.D.     NR/MEDQ  D:  03/05/2011  T:  03/05/2011  Job:  [id number]  cc:   Samuel Jester, MD Fax: [phone]

## 2011-03-10 ENCOUNTER — Telehealth (INDEPENDENT_AMBULATORY_CARE_PROVIDER_SITE_OTHER): Payer: Self-pay | Admitting: Internal Medicine

## 2011-03-10 NOTE — Telephone Encounter (Signed)
Already addressed

## 2011-03-16 ENCOUNTER — Encounter (HOSPITAL_COMMUNITY): Payer: Self-pay | Admitting: Internal Medicine

## 2011-05-11 LAB — BASIC METABOLIC PANEL
CO2: 26
Calcium: 9.4
Creatinine, Ser: 0.64
GFR calc Af Amer: >60
GFR calc non Af Amer: >60
Sodium: 136

## 2011-05-11 LAB — POCT CARDIAC MARKERS
CKMB, poc: <1 — ABNORMAL LOW
Myoglobin, poc: 45.8
Operator id: 205141

## 2011-05-11 LAB — CBC
Hemoglobin: 14.3
RBC: 4.49
WBC: 12.5 — ABNORMAL HIGH

## 2011-05-11 LAB — D-DIMER, QUANTITATIVE: D-Dimer, Quant: 0.31

## 2011-05-26 LAB — CARDIAC PANEL(CRET KIN+CKTOT+MB+TROPI)
CK, MB: 0.8
Total CK: 38
Total CK: 42
Troponin I: 0.02

## 2011-05-26 LAB — LIPID PANEL
LDL Cholesterol: 180 — ABNORMAL HIGH
Total CHOL/HDL Ratio: 8.2
Triglycerides: 177 — ABNORMAL HIGH
VLDL: 35

## 2011-05-26 LAB — BASIC METABOLIC PANEL
BUN: 8
CO2: 27
Chloride: 104
Creatinine, Ser: 0.68
Potassium: 4.3

## 2011-05-26 LAB — CBC
HCT: 37.9
MCHC: 34.1
MCV: 91.4
Platelets: 276
RBC: 4.14
WBC: 8.8

## 2011-05-26 LAB — POCT CARDIAC MARKERS
CKMB, poc: <1 — ABNORMAL LOW
CKMB, poc: <1 — ABNORMAL LOW
Myoglobin, poc: 59.6
Troponin i, poc: <0.05
Troponin i, poc: <0.05

## 2011-05-26 LAB — D-DIMER, QUANTITATIVE: D-Dimer, Quant: 0.48

## 2011-05-26 LAB — T4, FREE: Free T4: 1.01

## 2011-10-28 ENCOUNTER — Telehealth (INDEPENDENT_AMBULATORY_CARE_PROVIDER_SITE_OTHER): Payer: Self-pay | Admitting: Internal Medicine

## 2011-10-28 DIAGNOSIS — K2270 Barrett's esophagus without dysplasia: Secondary | ICD-10-CM

## 2011-10-28 MED ORDER — ESOMEPRAZOLE MAGNESIUM 20 MG PO CPDR: 20.0000 mg | DELAYED_RELEASE_CAPSULE | Freq: Two times a day (BID) | ORAL | Status: DC

## 2011-10-28 NOTE — Telephone Encounter (Signed)
Acid reflux is occuring at night. Taking Phenergan to help her sleep.  This started about 2 weeks ago and is not occurring on a daily basis. Will call in Nexium 40mg  BID to see if this helps.   Shelly [name].

## 2012-03-03 ENCOUNTER — Encounter (INDEPENDENT_AMBULATORY_CARE_PROVIDER_SITE_OTHER): Payer: Self-pay | Admitting: *Deleted

## 2012-03-16 ENCOUNTER — Ambulatory Visit (INDEPENDENT_AMBULATORY_CARE_PROVIDER_SITE_OTHER): Payer: BC Managed Care – PPO | Admitting: Internal Medicine

## 2012-03-24 ENCOUNTER — Other Ambulatory Visit (INDEPENDENT_AMBULATORY_CARE_PROVIDER_SITE_OTHER): Payer: Self-pay | Admitting: Internal Medicine

## 2012-04-07 ENCOUNTER — Encounter (INDEPENDENT_AMBULATORY_CARE_PROVIDER_SITE_OTHER): Payer: Self-pay | Admitting: *Deleted

## 2012-04-07 ENCOUNTER — Other Ambulatory Visit (INDEPENDENT_AMBULATORY_CARE_PROVIDER_SITE_OTHER): Payer: Self-pay | Admitting: *Deleted

## 2012-04-07 ENCOUNTER — Ambulatory Visit (INDEPENDENT_AMBULATORY_CARE_PROVIDER_SITE_OTHER): Payer: BC Managed Care – PPO | Admitting: Internal Medicine

## 2012-04-07 ENCOUNTER — Encounter (INDEPENDENT_AMBULATORY_CARE_PROVIDER_SITE_OTHER): Payer: Self-pay | Admitting: Internal Medicine

## 2012-04-07 VITALS — BP 130/80 | HR 72 | Temp 98.0°F | Ht 67.0 in | Wt 210.9 lb

## 2012-04-07 DIAGNOSIS — R1310 Dysphagia, unspecified: Secondary | ICD-10-CM

## 2012-04-07 DIAGNOSIS — B029 Zoster without complications: Secondary | ICD-10-CM | POA: Insufficient documentation

## 2012-04-07 DIAGNOSIS — K219 Gastro-esophageal reflux disease without esophagitis: Secondary | ICD-10-CM

## 2012-04-07 DIAGNOSIS — M797 Fibromyalgia: Secondary | ICD-10-CM | POA: Insufficient documentation

## 2012-04-07 DIAGNOSIS — E039 Hypothyroidism, unspecified: Secondary | ICD-10-CM | POA: Insufficient documentation

## 2012-04-07 NOTE — Progress Notes (Signed)
Subjective:     Patient ID: Shelly [name], female   DOB: 1961/06/06, 51 y.o.   MRN: [id number]  HPI Presents today. In 2011 she broke her rt ankle. She says she never really got over it.   Christmas she had Shingles. From Christmas to Easter she was in constant pain. She was on multiple narcotic drugs. In March she developed Shingles again.  She was then diagnosed with fibromyalgia. She tells me she has active Lyme's disease. 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  She was treated for Lyme's disease. Two weeks ago she had a argument with her husband. She says she ate a piece of steak and it lodged in her esophagus. She vomited the steak up.  It occurred again last week with a baked potato.  This has occurred 4 times in the past two weeks. Appetite is good. She is however afraid to eat. BMs are normal.  She also tells me she has trouble with liquids and I had her drink a cup of water.  She did not have any trouble drinking the water.  03/05/2011 EGD/Colonoscopy: FINAL DIAGNOSES:  1. Wavy or serrated GE junction but no obvious Barrett mucosa noted.  Biopsy taken from GE junction.  2. A 3-cm size sliding hiatal hernia.  3. Colonoscopy completed to cecum.  4. Small polyp ablated via cold biopsy from proximal transverse colon.  5. A 3-mm polyp at rectosigmoid junction was coagulated using snare  tip.  6. A 7-mm polyp snared from rectal sigmoid junction.  7. External hemorrhoids which would explain patient's intermittent  hematochezia.   Medications P4 SR HRT 200mg  capsule one a day Testosterone 4% Cream  Daily MMB6 12/20/38 Troch one daily Vitamin B12 shots once a week BEG Nasal Spray Current Outpatient Prescriptions on File Prior to Visit  Medication Sig Dispense Refill  . metFORMIN (GLUCOPHAGE) 500 MG tablet Take 500 mg by mouth 2 (two) times daily with a meal.      . NEXIUM 20 MG capsule TAKE 1 CAPSULE IN THE MORNING  30 each  5  . testosterone (ANDROGEL) 50 MG/5GM GEL Place onto the skin  daily. 4% cream      . thyroid (ARMOUR) 60 MG tablet Take 120 mg by mouth daily.             Review of Systems see hpi Current Outpatient Prescriptions  Medication Sig Dispense Refill  . cholecalciferol (VITAMIN D) 400 UNITS TABS Take by mouth.      . clotrimazole-betamethasone (LOTRISONE) cream Apply topically 3 (three) times daily.      . fish oil-omega-3 fatty acids 1000 MG capsule Take 2 g by mouth daily.      . metFORMIN (GLUCOPHAGE) 500 MG tablet Take 500 mg by mouth 2 (two) times daily with a meal.      . milk thistle 175 MG tablet Take 175 mg by mouth daily.      . Multiple Vitamin (MULTIVITAMIN) tablet Take 1 tablet by mouth daily.      . naltrexone (DEPADE) 50 MG tablet Take 3 mg by mouth daily.      Marland Kitchen NEXIUM 20 MG capsule TAKE 1 CAPSULE IN THE MORNING  30 each  5  . testosterone (ANDROGEL) 50 MG/5GM GEL Place onto the skin daily. 4% cream      . thyroid (ARMOUR) 60 MG tablet Take 120 mg by mouth daily.       ' Past Medical History  Diagnosis Date  . Hypothyroidism   .  GERD (gastroesophageal reflux disease)   . Acid reflux   . Focal nodular hyperplasia of liver surgery in 2004    partial hepactectomy   Past Surgical History  Procedure Date  . Partial hysterectomy   . Thyroidectomy, partial   . Carpal tunnel release   . Tibia hardware removal   . Esophagogastroduodenoscopy 03/05/2011    Procedure: ESOPHAGOGASTRODUODENOSCOPY (EGD);  Surgeon: Malissa Hippo, MD;  Location: [facility]ITE;  Service: Endoscopy;  Laterality: N/A;  . Colonoscopy 03/05/2011    Procedure: COLONOSCOPY;  Surgeon: Malissa Hippo, MD;  Location: [facility];  Service: Endoscopy;  Laterality: N/A;   History   Social History  . Marital Status: Married    Spouse Name: N/A    Number of Children: N/A  . Years of Education: N/A   Occupational History  . Not on file.   Social History Main Topics  . Smoking status: Current Everyday Smoker -- 0.2 packs/day for .5 years    Types: Cigarettes    . Smokeless tobacco: Not on file  . Alcohol Use: Yes  . Drug Use: No  . Sexually Active: Yes   Other Topics Concern  . Not on file   Social History Narrative  . No narrative on file   Family Status  Relation Status Death Age  . Mother Alive     good health  . Father Deceased     alcoholic cirrhosis  . Sister Alive     good health  . Brother Alive     good health   Allergies  Allergen Reactions  . Nsaids   . Sulfa Antibiotics         Objective:   Physical Exam Filed Vitals:   04/07/12 1418  Height: 5\' 7"  (1.702 m)  Weight: 210 lb 14.4 oz (95.664 kg)   Alert and oriented. Skin warm and dry. Oral mucosa is moist.   . Sclera anicteric, conjunctivae is pink. Thyroid not enlarged. No cervical lymphadenopathy. Lungs clear. Heart regular rate and rhythm.  Abdomen is soft. Bowel sounds are positive. No hepatomegaly. No abdominal masses felt. No tenderness.  No edema to lower extremities. Patient is alert and oriented.      Assessment:    Dysphagia to solids. Soft diet. No meats or breads.  The risks and benefits such as perforation, bleeding, and infection were reviewed with the patient and is agreeable.     Plan:    EGD/ED

## 2012-04-07 NOTE — Patient Instructions (Signed)
Soft diet

## 2012-04-08 ENCOUNTER — Encounter (HOSPITAL_COMMUNITY): Payer: Self-pay | Admitting: Pharmacy Technician

## 2012-04-15 ENCOUNTER — Encounter (HOSPITAL_COMMUNITY)
Admission: RE | Disposition: A | Discharge status: Home / Self Care | Payer: Self-pay | Source: Ambulatory Visit | Attending: Internal Medicine

## 2012-04-15 ENCOUNTER — Ambulatory Visit (HOSPITAL_COMMUNITY)
Admission: RE | Admit: 2012-04-15 | Discharge: 2012-04-15 | Disposition: A | Discharge status: Home / Self Care | Payer: BC Managed Care – PPO | Source: Ambulatory Visit | Attending: Internal Medicine | Admitting: Internal Medicine

## 2012-04-15 ENCOUNTER — Encounter (HOSPITAL_COMMUNITY): Payer: Self-pay

## 2012-04-15 DIAGNOSIS — K449 Diaphragmatic hernia without obstruction or gangrene: Secondary | ICD-10-CM

## 2012-04-15 DIAGNOSIS — K222 Esophageal obstruction: Secondary | ICD-10-CM | POA: Insufficient documentation

## 2012-04-15 DIAGNOSIS — K2100 Gastro-esophageal reflux disease with esophagitis, without bleeding: Secondary | ICD-10-CM | POA: Insufficient documentation

## 2012-04-15 DIAGNOSIS — K208 Other esophagitis without bleeding: Secondary | ICD-10-CM

## 2012-04-15 DIAGNOSIS — R1310 Dysphagia, unspecified: Secondary | ICD-10-CM | POA: Insufficient documentation

## 2012-04-15 DIAGNOSIS — K2270 Barrett's esophagus without dysplasia: Secondary | ICD-10-CM | POA: Insufficient documentation

## 2012-04-15 HISTORY — DX: Nausea with vomiting, unspecified: R11.2

## 2012-04-15 HISTORY — DX: Other specified postprocedural states: Z98.890

## 2012-04-15 SURGERY — ESOPHAGOGASTRODUODENOSCOPY (EGD) WITH ESOPHAGEAL DILATION
Anesthesia: Moderate Sedation

## 2012-04-15 MED ORDER — ESOMEPRAZOLE MAGNESIUM 40 MG PO CPDR: 40.0000 mg | DELAYED_RELEASE_CAPSULE | Freq: Every day | ORAL | Status: DC

## 2012-04-15 MED ORDER — BUTAMBEN-TETRACAINE-BENZOCAINE 2-2-14 % EX AERO
INHALATION_SPRAY | CUTANEOUS | Status: DC | PRN
  Administered 2012-04-15: 2 via TOPICAL

## 2012-04-15 MED ORDER — MIDAZOLAM HCL 5 MG/5ML IJ SOLN
INTRAMUSCULAR | Status: DC | PRN
  Administered 2012-04-15 (×2): 3 mg via INTRAVENOUS
  Administered 2012-04-15: 2 mg via INTRAVENOUS

## 2012-04-15 MED ORDER — STERILE WATER FOR IRRIGATION IR SOLN
Status: DC | PRN
  Administered 2012-04-15: 10:00:00

## 2012-04-15 MED ORDER — MEPERIDINE HCL 50 MG/ML IJ SOLN
INTRAMUSCULAR | Status: AC
  Filled 2012-04-15: qty 1

## 2012-04-15 MED ORDER — MIDAZOLAM HCL 5 MG/5ML IJ SOLN
INTRAMUSCULAR | Status: AC
  Filled 2012-04-15: qty 10

## 2012-04-15 MED ORDER — SODIUM CHLORIDE 0.45 % IV SOLN: INTRAVENOUS | Status: DC

## 2012-04-15 MED ORDER — MEPERIDINE HCL 25 MG/ML IJ SOLN
INTRAMUSCULAR | Status: DC | PRN
  Administered 2012-04-15 (×2): 25 mg via INTRAVENOUS

## 2012-04-15 NOTE — H&P (Signed)
Shelly [name] is an 51 y.o. female.   Chief Complaint: Patient is here for EGD and ED. HPI: Patient is 51 year old Caucasian female who presents with dysphagia to solids. Symptoms started 3 months ago. She's had few episodes were she had a for impaction lasting for couple of hours. She points to upper sternal area at site of bolus obstruction. She states she has breakthrough heartburn 2-3 times a months. This always occurs at night when she is eating food she should not have. He denies melena or abdominal pain. Patient has known short segment Barrett's esophagus. Her last EGD with biopsy was in July 2012 and no dysplasia noted on biopsies.  Past Medical History  Diagnosis Date  . Hypothyroidism   . GERD (gastroesophageal reflux disease)   . Acid reflux   . Focal nodular hyperplasia of liver surgery in 2004    partial hepactectomy  . PONV (postoperative nausea and vomiting)     Past Surgical History  Procedure Date  . Partial hysterectomy   . Thyroidectomy, partial   . Carpal tunnel release   . Tibia hardware removal   . Esophagogastroduodenoscopy 03/05/2011    Procedure: ESOPHAGOGASTRODUODENOSCOPY (EGD);  Surgeon: Malissa Hippo, MD;  Location: [facility];  Service: Endoscopy;  Laterality: N/A;  . Colonoscopy 03/05/2011    Procedure: COLONOSCOPY;  Surgeon: Malissa Hippo, MD;  Location: [facility];  Service: Endoscopy;  Laterality: N/A;    Family History  Problem Relation Age of Onset  . Anesthesia problems Neg Hx   . Hypotension Neg Hx   . Malignant hyperthermia Neg Hx   . Pseudochol deficiency Neg Hx    Social History:  reports that she has been smoking Cigarettes.  She has a .125 pack-year smoking history. She does not have any smokeless tobacco history on file. She reports that she drinks alcohol. She reports that she does not use illicit drugs.  Allergies:  Allergies  Allergen Reactions  . Nsaids Other (See Comments)    Causes Gastric Problems   . Sulfa  Antibiotics Hives    Medications Prior to Admission  Medication Sig Dispense Refill  . cholecalciferol (VITAMIN D) 400 UNITS TABS Take 400 Units by mouth daily.       . clotrimazole-betamethasone (LOTRISONE) cream Apply topically 3 (three) times daily.      Marland Kitchen esomeprazole (NEXIUM) 20 MG capsule Take 20 mg by mouth daily before breakfast.      . fish oil-omega-3 fatty acids 1000 MG capsule Take 2 g by mouth daily.      . metFORMIN (GLUCOPHAGE) 500 MG tablet Take 500 mg by mouth daily with breakfast.       . milk thistle 175 MG tablet Take 175 mg by mouth daily.      . Multiple Vitamin (MULTIVITAMIN) tablet Take 1 tablet by mouth daily.      . Naltrexone HCl (DEPADE PO) Take 3 mg by mouth at bedtime. Compounded medication that is taken for fibromyalgia.      Marland Kitchen NEXIUM 20 MG capsule TAKE 1 CAPSULE IN THE MORNING  30 each  5  . testosterone (ANDROGEL) 50 MG/5GM GEL Place onto the skin daily. 4% cream      . thyroid (ARMOUR) 60 MG tablet Take 120 mg by mouth daily.         No results found for this or any previous visit (from the past 48 hour(s)). No results found.  ROS  Blood pressure 129/73, pulse 84, temperature 97.6 F (36.4 C),  temperature source Oral, resp. rate 12, SpO2 98.00%. Physical Exam  Constitutional: She appears well-developed and well-nourished.  HENT:  Mouth/Throat: Oropharynx is clear and moist.  Eyes: Conjunctivae are normal. No scleral icterus.  Neck: No thyromegaly present.  Cardiovascular: Normal rate, regular rhythm and normal heart sounds.   No murmur heard. Respiratory: Effort normal and breath sounds normal.  GI: She exhibits no distension and no mass. There is Tenderness: mild tenderness over right subcostal scar..  Musculoskeletal: She exhibits no edema.  Lymphadenopathy:    She has no cervical adenopathy.  Neurological: She is alert.  Skin: Skin is warm and dry.     Assessment/Plan Dysphagia in a patient with chronic GERD complicated by short segment  Barrett's. EGD with ED.  Shelly [name] 04/15/2012, 10:17 AM

## 2012-04-15 NOTE — Op Note (Signed)
EGD PROCEDURE REPORT  PATIENT:  Shelly [name]  MR#:  [id number] Birthdate:  [birth year], 51 y.o., female Endoscopist:  Dr. Malissa Hippo, MD Referred By:  Dr. Samuel Jester, DO Procedure Date: 04/15/2012  Procedure:   EGD with ED.  Indications:  Patient is 51 year old Caucasian female who has chronic GERD complicated by short segment Barrett's esophagus who presents for solid food dysphagia. She was recently treated with doxycycline for Lyme disease but does not recall having had any problems swallowing this medication.           Informed Consent:  The risks, benefits, alternatives & imponderables which include, but are not limited to, bleeding, infection, perforation, drug reaction and potential missed lesion have been reviewed.  The potential for biopsy, lesion removal, esophageal dilation, etc. have also been discussed.  Questions have been answered.  All parties agreeable.  Please see history & physical in medical record for more information.  Medications:  Demerol 50 mg IV Versed 8 mg IV Cetacaine spray topically for oropharyngeal anesthesia  Description of procedure:  The endoscope was introduced through the mouth and advanced to the second portion of the duodenum without difficulty or limitations. The mucosal surfaces were surveyed very carefully during advancement of the scope and upon withdrawal.  Findings:  Esophagus:  Mucosa of the proximal and middle third was normal. Distally there was 10 mm long erosion extending to GE junction. Ring noted at GE junction. Single small patch of salmon mucosa no more than 6 mm tall. GEJ:  34 cm Hiatus:  37 cm Stomach:  Stomach was empty and distended very well with insufflation. Folds in the proximal stomach are normal. Examination of mucosa at body, antrum, pyloric channel, angularis, fundus and cardia was normal . Duodenum:  Normal bulbar and post bulbar mucosa.  Therapeutic/Diagnostic Maneuvers Performed:  Esophagus dilated by passing 54  French Maloney dilator to stomach and a ring which was further open up with cold biopsy forceps. No tissue was saved.  Complications:  None  Impression: Erosive reflux esophagitis with Schatzki's ring. Short segment Barrett's esophagus. Small sliding hiatal hernia. Shots these ring disrupted by passing 54 [place] Maloney dilator and also with focal biopsy.  Recommendations:  Anti-reflux measures reinforced. Increase Nexium to 40 mg by mouth every morning. Progress report in one week. Office visit in one year.  REHMAN,NAJEEB U  04/15/2012  10:50 AM  CC: Dr. Charm Barges, Aram Beecham, DO & Dr. No ref. provider found

## 2012-04-18 LAB — GLUCOSE, CAPILLARY: Glucose-Capillary: 114 mg/dL — ABNORMAL HIGH (ref 70–99)

## 2012-07-18 ENCOUNTER — Emergency Department (HOSPITAL_COMMUNITY): Payer: BC Managed Care – PPO

## 2012-07-18 ENCOUNTER — Emergency Department (HOSPITAL_COMMUNITY)
Admission: EM | Admit: 2012-07-18 | Discharge: 2012-07-18 | Disposition: A | Discharge status: Home / Self Care | Payer: BC Managed Care – PPO | Attending: Emergency Medicine | Admitting: Emergency Medicine

## 2012-07-18 ENCOUNTER — Encounter (HOSPITAL_COMMUNITY): Payer: Self-pay | Admitting: *Deleted

## 2012-07-18 ENCOUNTER — Other Ambulatory Visit: Payer: Self-pay

## 2012-07-18 DIAGNOSIS — K219 Gastro-esophageal reflux disease without esophagitis: Secondary | ICD-10-CM | POA: Insufficient documentation

## 2012-07-18 DIAGNOSIS — Z8719 Personal history of other diseases of the digestive system: Secondary | ICD-10-CM | POA: Insufficient documentation

## 2012-07-18 DIAGNOSIS — R42 Dizziness and giddiness: Secondary | ICD-10-CM | POA: Insufficient documentation

## 2012-07-18 DIAGNOSIS — F17200 Nicotine dependence, unspecified, uncomplicated: Secondary | ICD-10-CM | POA: Insufficient documentation

## 2012-07-18 DIAGNOSIS — E039 Hypothyroidism, unspecified: Secondary | ICD-10-CM | POA: Insufficient documentation

## 2012-07-18 DIAGNOSIS — R110 Nausea: Secondary | ICD-10-CM | POA: Insufficient documentation

## 2012-07-18 DIAGNOSIS — Z79899 Other long term (current) drug therapy: Secondary | ICD-10-CM | POA: Insufficient documentation

## 2012-07-18 DIAGNOSIS — R002 Palpitations: Secondary | ICD-10-CM | POA: Insufficient documentation

## 2012-07-18 LAB — CBC WITH DIFFERENTIAL/PLATELET
Basophils Relative: 0 % (ref 0–1)
Eosinophils Absolute: 0.1 10*3/uL (ref 0.0–0.7)
Hemoglobin: 13.8 g/dL (ref 12.0–15.0)
MCH: 29.9 pg (ref 26.0–34.0)
MCHC: 34.5 g/dL (ref 30.0–36.0)
Monocytes Relative: 5 % (ref 3–12)
Neutro Abs: 4.9 10*3/uL (ref 1.7–7.7)
Neutrophils Relative %: 65 % (ref 43–77)
Platelets: 268 10*3/uL (ref 150–400)
RBC: 4.62 MIL/uL (ref 3.87–5.11)

## 2012-07-18 LAB — COMPREHENSIVE METABOLIC PANEL
ALT: 20 U/L (ref 0–35)
AST: 13 U/L (ref 0–37)
Albumin: 3.4 g/dL — ABNORMAL LOW (ref 3.5–5.2)
Alkaline Phosphatase: 70 U/L (ref 39–117)
Chloride: 102 mEq/L (ref 96–112)
Potassium: 4.4 mEq/L (ref 3.5–5.1)
Sodium: 138 mEq/L (ref 135–145)
Total Bilirubin: 0.1 mg/dL — ABNORMAL LOW (ref 0.3–1.2)
Total Protein: 6.3 g/dL (ref 6.0–8.3)

## 2012-07-18 NOTE — ED Notes (Signed)
Nausea yesterday,and did not feel well yesterday, Today palpitations, and "blanking out". Dizzy,No chest pain, no sob

## 2012-07-18 NOTE — ED Provider Notes (Signed)
History  This chart was scribed for Benny Lennert, MD by Shari Heritage, ED Scribe. The patient was seen in room [facility]. Patient's care was started at 1537.  CSN: [id number]  Arrival date & time 07/18/12  1433   First MD Initiated Contact with Patient 07/18/12 1537      Chief Complaint  Patient presents with  . Palpitations     Patient is a 51 y.o. female presenting with palpitations. The history is provided by the patient. No language interpreter was used.  Palpitations  This is a new problem. The current episode started 1 to 2 hours ago. The problem occurs hourly. The problem has not changed since onset.Associated symptoms include nausea and dizziness. Pertinent negatives include no chest pain, no abdominal pain, no headaches, no back pain and no cough. She has tried nothing for the symptoms. Her past medical history does not include heart disease.    HPI Comments: Shelly [name] is a 51 y.o. female who presents to the Emergency Department complaining of 5 episodes of strong palpitations onset a couple of hours ago. Each episode lasted a few minutes. Patient hasn't had any episodes in the ED. There is associated nausea, dizziness and disorientation. Patient also reports some fleeting chest pain. Patient denies all other symptoms. She reports no aggravating or relieving factors. Patient says that she had a stress test 5 years ago, but has no documented heart conditions.   Past Medical History  Diagnosis Date  . Hypothyroidism   . GERD (gastroesophageal reflux disease)   . Acid reflux   . Focal nodular hyperplasia of liver surgery in 2004    partial hepactectomy  . PONV (postoperative nausea and vomiting)     Past Surgical History  Procedure Date  . Partial hysterectomy   . Thyroidectomy, partial   . Carpal tunnel release   . Tibia hardware removal   . Esophagogastroduodenoscopy 03/05/2011    Procedure: ESOPHAGOGASTRODUODENOSCOPY (EGD);  Surgeon: Malissa Hippo, MD;   Location: [facility];  Service: Endoscopy;  Laterality: N/A;  . Colonoscopy 03/05/2011    Procedure: COLONOSCOPY;  Surgeon: Malissa Hippo, MD;  Location: [facility];  Service: Endoscopy;  Laterality: N/A;    Family History  Problem Relation Age of Onset  . Anesthesia problems Neg Hx   . Hypotension Neg Hx   . Malignant hyperthermia Neg Hx   . Pseudochol deficiency Neg Hx     History  Substance Use Topics  . Smoking status: Current Every Day Smoker -- 0.2 packs/day for .5 years    Types: Cigarettes  . Smokeless tobacco: Not on file  . Alcohol Use: Yes    OB History    Grav Para Term Preterm Abortions TAB SAB Ect Mult Living                  Review of Systems  Constitutional: Negative for fatigue.  HENT: Negative for congestion, sinus pressure and ear discharge.   Eyes: Negative for discharge.  Respiratory: Negative for cough.   Cardiovascular: Positive for palpitations. Negative for chest pain.  Gastrointestinal: Positive for nausea. Negative for abdominal pain and diarrhea.  Genitourinary: Negative for frequency and hematuria.  Musculoskeletal: Negative for back pain.  Skin: Negative for rash.  Neurological: Positive for dizziness. Negative for seizures and headaches.  Hematological: Negative.   Psychiatric/Behavioral: Negative for hallucinations.    Allergies  Nsaids and Sulfa antibiotics  Home Medications   Current Outpatient Rx  Name  Route  Sig  Dispense  Refill  . CHOLECALCIFEROL 400 UNITS PO TABS   Oral   Take 400 Units by mouth daily.          Marland Kitchen ESOMEPRAZOLE MAGNESIUM 40 MG PO CPDR   Oral   Take 1 capsule (40 mg total) by mouth daily before breakfast.   30 capsule   11   . OMEGA-3 FATTY ACIDS 1000 MG PO CAPS   Oral   Take 2 g by mouth daily.         Marland Kitchen ONE-DAILY MULTI VITAMINS PO TABS   Oral   Take 1 tablet by mouth daily.         Marland Kitchen DEPADE PO   Oral   Take 3 mg by mouth at bedtime. Compounded medication that is taken for  fibromyalgia.         Marland Kitchen PRESCRIPTION MEDICATION   Injection   Inject 1 tablet as directed every 3 (three) months. bioidentical testosterone pellet injection.         . THYROID 60 MG PO TABS   Oral   Take 120 mg by mouth daily.            Triage Vitals: BP 119/70  Pulse 70  Temp 97.9 F (36.6 C) (Oral)  Resp 20  Ht 5\' 6"  (1.676 m)  Wt 215 lb (97.523 kg)  BMI 34.70 kg/m2  SpO2 96%  Physical Exam  Constitutional: She is oriented to person, place, and time. She appears well-developed and well-nourished.  HENT:  Head: Normocephalic and atraumatic.  Eyes: Conjunctivae normal and EOM are normal. No scleral icterus.  Neck: Neck supple. No thyromegaly present.  Cardiovascular: Normal rate and regular rhythm.  Exam reveals no gallop and no friction rub.   No murmur heard. Pulmonary/Chest: No stridor. She has no wheezes. She has no rales. She exhibits no tenderness.  Abdominal: She exhibits no distension. There is no tenderness. There is no rebound.  Musculoskeletal: Normal range of motion. She exhibits no edema.  Lymphadenopathy:    She has no cervical adenopathy.  Neurological: She is oriented to person, place, and time. Coordination normal.  Skin: No rash noted. No erythema.  Psychiatric: She has a normal mood and affect. Her behavior is normal.    ED Course  Procedures (including critical care time) DIAGNOSTIC STUDIES: Oxygen Saturation is 96% on room air, adequate by my interpretation.    COORDINATION OF CARE: 3:39 PM- Patient informed of current plan for treatment and evaluation and agrees with plan at this time.      Labs Reviewed - No data to display No results found.   No diagnosis found.   Date: 07/18/2012  Rate:72  Rhythm: normal sinus rhythm  QRS Axis: normal  Intervals: normal  ST/T Wave abnormalities: normal  Conduction Disutrbances:none  Narrative Interpretation:   Old EKG Reviewed: none available    MDM   The chart was scribed for me  under my direct supervision.  I personally performed the history, physical, and medical decision making and all procedures in the evaluation of this patient.Benny Lennert, MD 07/18/12 [phone]

## 2012-10-22 ENCOUNTER — Other Ambulatory Visit (INDEPENDENT_AMBULATORY_CARE_PROVIDER_SITE_OTHER): Payer: Self-pay | Admitting: Internal Medicine

## 2013-01-06 ENCOUNTER — Other Ambulatory Visit: Payer: Self-pay | Admitting: Orthopedic Surgery

## 2013-01-10 ENCOUNTER — Encounter (HOSPITAL_BASED_OUTPATIENT_CLINIC_OR_DEPARTMENT_OTHER): Payer: Self-pay | Admitting: *Deleted

## 2013-01-10 NOTE — Progress Notes (Signed)
Pt has been here x2 2011 for fx ankle and HW removal-no sleep apnea or heart problems-did have chest pain 08-worked up by dr Lyman Speller.

## 2013-01-11 NOTE — H&P (Signed)
Shelly [name] is an 52 y.o. female.   Chief Complaint: c/o chronic and progressive numbness and tingling of the left hand and STS symptoms of the left thumb HPI: Shelly [name] is a well known former patient who had a right carpal tunnel release in 2011. She is known to have carpal tunnel syndrome on the left. She presents for evaluation and triggering of her left thumb, persistent numbness in the left hand, and loss of left hand grip strength.  Electrodiagnostic studies in January 2011 demonstrated severe bilateral carpal tunnel syndrome. She has done very well following release of her right transverse carpal ligament.    Past Medical History  Diagnosis Date  . Hypothyroidism   . GERD (gastroesophageal reflux disease)   . Acid reflux   . Focal nodular hyperplasia of liver surgery in 2004    partial hepactectomy  . PONV (postoperative nausea and vomiting)   . Carpal tunnel syndrome   . Wears glasses     Past Surgical History  Procedure Laterality Date  . Partial hysterectomy    . Thyroidectomy, partial    . Carpal tunnel release      rt  . Tibia hardware removal  2011    rt ankle   . Esophagogastroduodenoscopy  03/05/2011    Procedure: ESOPHAGOGASTRODUODENOSCOPY (EGD);  Surgeon: Malissa Hippo, MD;  Location: [facility];  Service: Endoscopy;  Laterality: N/A;  . Colonoscopy  03/05/2011    Procedure: COLONOSCOPY;  Surgeon: Malissa Hippo, MD;  Location: [facility];  Service: Endoscopy;  Laterality: N/A;  . Cholecystectomy  2004    liver resection benign tumor with GB  . Fracture surgery  2011    rt ankle    Family History  Problem Relation Age of Onset  . Anesthesia problems Neg Hx   . Hypotension Neg Hx   . Malignant hyperthermia Neg Hx   . Pseudochol deficiency Neg Hx    Social History:  reports that she has been smoking Cigarettes.  She has a .125 pack-year smoking history. She does not have any smokeless tobacco history on file. She reports that  drinks alcohol.  She reports that she does not use illicit drugs.  Allergies:  Allergies  Allergen Reactions  . Nsaids Other (See Comments)    Causes Gastric Problems   . Sulfa Antibiotics Hives    No prescriptions prior to admission    No results found for this or any previous visit (from the past 48 hour(s)).  No results found.   Pertinent items are noted in HPI.  There were no vitals taken for this visit.  General appearance: alert Head: Normocephalic, without obvious abnormality Neck: supple, symmetrical, trachea midline Resp: clear to auscultation bilaterally Cardio: regular rate and rhythm GI: normal findings: bowel sounds normal Extremities:  She is noted to have shouldering of her left thumb CMC joint. She has pain with CMC translation and grind. She has all the positive provocative signs of left carpal tunnel syndrome including direct compression and wrist flexion.  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 Examination of the left thumb reveals chronic stenosing tenosynovitis of the A1 pulley.  We reviewed her electrodiagnostic studies which confirmed carpal tunnel syndrome in 2011.    Pulses: 2+ and symmetric Skin: normal Neurologic: Grossly normal    Assessment/Plan Impression: Left CTS and STS left thumb at the first dorsal compartment and at A1 pulley. 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  Plan: To the OR for left CTR and release left thumb A-1 pulley. Release of the first dorsal compartment will be completed based on examination in the holding area. Injection of the left thumb carpometacarpal joint will be accomplished with Depo-Medrol and lidocaine. The procedure, risks,benefits and post-op course were discussed with the patient at length and they were in agreement with the plan.  DASNOIT,Ajna Moors J 01/11/2013, 5:03 PM  H&P  documentation: 01/12/2013  -History and Physical Reviewed  -Patient has been re-examined  -No change in the plan of care  Wyn Forster, MD

## 2013-01-12 ENCOUNTER — Encounter (HOSPITAL_BASED_OUTPATIENT_CLINIC_OR_DEPARTMENT_OTHER)
Admission: RE | Disposition: A | Discharge status: Home / Self Care | Payer: Self-pay | Source: Ambulatory Visit | Attending: Orthopedic Surgery

## 2013-01-12 ENCOUNTER — Ambulatory Visit (HOSPITAL_BASED_OUTPATIENT_CLINIC_OR_DEPARTMENT_OTHER)
Admission: RE | Admit: 2013-01-12 | Discharge: 2013-01-12 | Disposition: A | Discharge status: Home / Self Care | Payer: BC Managed Care – PPO | Source: Ambulatory Visit | Attending: Orthopedic Surgery | Admitting: Orthopedic Surgery

## 2013-01-12 ENCOUNTER — Encounter (HOSPITAL_BASED_OUTPATIENT_CLINIC_OR_DEPARTMENT_OTHER): Payer: Self-pay | Admitting: *Deleted

## 2013-01-12 ENCOUNTER — Ambulatory Visit (HOSPITAL_BASED_OUTPATIENT_CLINIC_OR_DEPARTMENT_OTHER): Payer: BC Managed Care – PPO | Admitting: Anesthesiology

## 2013-01-12 ENCOUNTER — Encounter (HOSPITAL_BASED_OUTPATIENT_CLINIC_OR_DEPARTMENT_OTHER): Payer: Self-pay | Admitting: Anesthesiology

## 2013-01-12 DIAGNOSIS — Z882 Allergy status to sulfonamides status: Secondary | ICD-10-CM | POA: Insufficient documentation

## 2013-01-12 DIAGNOSIS — M19049 Primary osteoarthritis, unspecified hand: Secondary | ICD-10-CM | POA: Insufficient documentation

## 2013-01-12 DIAGNOSIS — M6530 Trigger finger, unspecified finger: Secondary | ICD-10-CM | POA: Insufficient documentation

## 2013-01-12 DIAGNOSIS — E039 Hypothyroidism, unspecified: Secondary | ICD-10-CM | POA: Insufficient documentation

## 2013-01-12 DIAGNOSIS — M654 Radial styloid tenosynovitis [de Quervain]: Secondary | ICD-10-CM | POA: Insufficient documentation

## 2013-01-12 DIAGNOSIS — Z886 Allergy status to analgesic agent status: Secondary | ICD-10-CM | POA: Insufficient documentation

## 2013-01-12 DIAGNOSIS — K219 Gastro-esophageal reflux disease without esophagitis: Secondary | ICD-10-CM | POA: Insufficient documentation

## 2013-01-12 DIAGNOSIS — G5600 Carpal tunnel syndrome, unspecified upper limb: Secondary | ICD-10-CM | POA: Insufficient documentation

## 2013-01-12 HISTORY — PX: STERIOD INJECTION: SHX5046

## 2013-01-12 HISTORY — PX: TRIGGER FINGER RELEASE: SHX641

## 2013-01-12 HISTORY — PX: DORSAL COMPARTMENT RELEASE: SHX5039

## 2013-01-12 HISTORY — PX: CARPAL TUNNEL RELEASE: SHX101

## 2013-01-12 HISTORY — DX: Presence of spectacles and contact lenses: Z97.3

## 2013-01-12 HISTORY — DX: Carpal tunnel syndrome, unspecified upper limb: G56.00

## 2013-01-12 SURGERY — CARPAL TUNNEL RELEASE
Anesthesia: General | Site: Wrist | Laterality: Left | Wound class: Clean

## 2013-01-12 MED ORDER — FENTANYL CITRATE 0.05 MG/ML IJ SOLN
INTRAMUSCULAR | Status: DC | PRN
  Administered 2013-01-12: 50 ug via INTRAVENOUS
  Administered 2013-01-12: 100 ug via INTRAVENOUS

## 2013-01-12 MED ORDER — LIDOCAINE HCL 2 % IJ SOLN
INTRAMUSCULAR | Status: DC | PRN
  Administered 2013-01-12: 5 mL

## 2013-01-12 MED ORDER — OXYCODONE-ACETAMINOPHEN 5-325 MG PO TABS: ORAL_TABLET | ORAL | Status: DC

## 2013-01-12 MED ORDER — OXYCODONE HCL 5 MG/5ML PO SOLN: 5.0000 mg | Freq: Once | ORAL | Status: AC | PRN

## 2013-01-12 MED ORDER — LACTATED RINGERS IV SOLN
INTRAVENOUS | Status: DC
  Administered 2013-01-12: 10:00:00 via INTRAVENOUS

## 2013-01-12 MED ORDER — MIDAZOLAM HCL 2 MG/2ML IJ SOLN: 1.0000 mg | INTRAMUSCULAR | Status: DC | PRN

## 2013-01-12 MED ORDER — METHYLPREDNISOLONE ACETATE 40 MG/ML IJ SUSP
INTRAMUSCULAR | Status: DC | PRN
  Administered 2013-01-12: 2 mL

## 2013-01-12 MED ORDER — HYDROMORPHONE HCL PF 1 MG/ML IJ SOLN
0.2500 mg | INTRAMUSCULAR | Status: DC | PRN
  Administered 2013-01-12 (×4): 0.5 mg via INTRAVENOUS

## 2013-01-12 MED ORDER — OXYCODONE HCL 5 MG PO TABS
5.0000 mg | ORAL_TABLET | Freq: Once | ORAL | Status: AC | PRN
  Administered 2013-01-12: 5 mg via ORAL

## 2013-01-12 MED ORDER — ONDANSETRON HCL 4 MG/2ML IJ SOLN
INTRAMUSCULAR | Status: DC | PRN
  Administered 2013-01-12: 4 mg via INTRAVENOUS

## 2013-01-12 MED ORDER — PROPOFOL 10 MG/ML IV BOLUS
INTRAVENOUS | Status: DC | PRN
  Administered 2013-01-12: 250 mg via INTRAVENOUS

## 2013-01-12 MED ORDER — FENTANYL CITRATE 0.05 MG/ML IJ SOLN: 50.0000 ug | INTRAMUSCULAR | Status: DC | PRN

## 2013-01-12 MED ORDER — LIDOCAINE HCL (CARDIAC) 20 MG/ML IV SOLN
INTRAVENOUS | Status: DC | PRN
  Administered 2013-01-12: 50 mg via INTRAVENOUS

## 2013-01-12 MED ORDER — CHLORHEXIDINE GLUCONATE 4 % EX LIQD: 60.0000 mL | Freq: Once | CUTANEOUS | Status: DC

## 2013-01-12 MED ORDER — MIDAZOLAM HCL 5 MG/5ML IJ SOLN
INTRAMUSCULAR | Status: DC | PRN
  Administered 2013-01-12: 2 mg via INTRAVENOUS

## 2013-01-12 MED ORDER — DEXAMETHASONE SODIUM PHOSPHATE 4 MG/ML IJ SOLN
INTRAMUSCULAR | Status: DC | PRN
  Administered 2013-01-12: 10 mg via INTRAVENOUS

## 2013-01-12 SURGICAL SUPPLY — 46 items
BANDAGE ADHESIVE 1X3 (GAUZE/BANDAGES/DRESSINGS) ×4 IMPLANT
BANDAGE ELASTIC 3 VELCRO ST LF (GAUZE/BANDAGES/DRESSINGS) ×3 IMPLANT
BLADE SURG 15 STRL LF DISP TIS (BLADE) ×2 IMPLANT
BLADE SURG 15 STRL SS (BLADE) ×3
BNDG CMPR 9X4 STRL LF SNTH (GAUZE/BANDAGES/DRESSINGS) ×2
BNDG ESMARK 4X9 LF (GAUZE/BANDAGES/DRESSINGS) ×1 IMPLANT
BRUSH SCRUB EZ PLAIN DRY (MISCELLANEOUS) ×3 IMPLANT
CLOTH BEACON ORANGE TIMEOUT ST (SAFETY) ×3 IMPLANT
CORDS BIPOLAR (ELECTRODE) ×1 IMPLANT
COVER MAYO STAND STRL (DRAPES) ×3 IMPLANT
COVER TABLE BACK 60X90 (DRAPES) ×3 IMPLANT
CUFF TOURNIQUET SINGLE 18IN (TOURNIQUET CUFF) ×1 IMPLANT
DECANTER SPIKE VIAL GLASS SM (MISCELLANEOUS) ×1 IMPLANT
DRAPE EXTREMITY T 121X128X90 (DRAPE) ×3 IMPLANT
DRAPE SURG 17X23 STRL (DRAPES) ×3 IMPLANT
GLOVE BIO SURGEON STRL SZ 6.5 (GLOVE) ×2 IMPLANT
GLOVE BIOGEL M STRL SZ7.5 (GLOVE) ×2 IMPLANT
GLOVE BIOGEL PI IND STRL 7.0 (GLOVE) IMPLANT
GLOVE BIOGEL PI IND STRL 8 (GLOVE) IMPLANT
GLOVE BIOGEL PI INDICATOR 7.0 (GLOVE) ×2
GLOVE BIOGEL PI INDICATOR 8 (GLOVE) ×1
GLOVE ORTHO TXT STRL SZ7.5 (GLOVE) ×3 IMPLANT
GOWN BRE IMP PREV XXLGXLNG (GOWN DISPOSABLE) ×7 IMPLANT
GOWN PREVENTION PLUS XLARGE (GOWN DISPOSABLE) ×4 IMPLANT
NEEDLE 27GAX1X1/2 (NEEDLE) ×1 IMPLANT
PACK BASIN DAY SURGERY FS (CUSTOM PROCEDURE TRAY) ×3 IMPLANT
PAD ALCOHOL SWAB (MISCELLANEOUS) ×2 IMPLANT
PAD CAST 3X4 CTTN HI CHSV (CAST SUPPLIES) ×2 IMPLANT
PADDING CAST ABS 4INX4YD NS (CAST SUPPLIES) ×1
PADDING CAST ABS COTTON 4X4 ST (CAST SUPPLIES) ×2 IMPLANT
PADDING CAST COTTON 3X4 STRL (CAST SUPPLIES) ×3
SPLINT PLASTER CAST XFAST 3X15 (CAST SUPPLIES) ×10 IMPLANT
SPLINT PLASTER XTRA FASTSET 3X (CAST SUPPLIES) ×5
SPONGE GAUZE 4X4 12PLY (GAUZE/BANDAGES/DRESSINGS) ×3 IMPLANT
STOCKINETTE 4X48 STRL (DRAPES) ×3 IMPLANT
STRIP CLOSURE SKIN 1/2X4 (GAUZE/BANDAGES/DRESSINGS) ×3 IMPLANT
SUT ETHILON 5 0 P 3 18 (SUTURE)
SUT NYLON ETHILON 5-0 P-3 1X18 (SUTURE) IMPLANT
SUT PROLENE 3 0 PS 2 (SUTURE) ×3 IMPLANT
SUT PROLENE 4 0 P 3 18 (SUTURE) IMPLANT
SWABSTICK POVIDONE IODINE SNGL (MISCELLANEOUS) ×6 IMPLANT
SYR 3ML 23GX1 SAFETY (SYRINGE) ×1 IMPLANT
SYR 3ML LL SCALE MARK (SYRINGE) ×2 IMPLANT
SYR CONTROL 10ML LL (SYRINGE) ×1 IMPLANT
TRAY DSU PREP LF (CUSTOM PROCEDURE TRAY) ×3 IMPLANT
UNDERPAD 30X30 INCONTINENT (UNDERPADS AND DIAPERS) ×3 IMPLANT

## 2013-01-12 NOTE — Anesthesia Preprocedure Evaluation (Addendum)
Anesthesia Evaluation  Patient identified by MRN, date of birth, ID band Patient awake    Reviewed: Allergy & Precautions, H&P , NPO status , Patient's Chart, lab work & pertinent test results  History of Anesthesia Complications (+) PONV  Airway Mallampati: II      Dental   Pulmonary  breath sounds clear to auscultation        Cardiovascular negative cardio ROS  Rhythm:Regular Rate:Normal     Neuro/Psych    GI/Hepatic GERD-  ,Hx of Barrett's disease. Ce   Endo/Other  Hypothyroidism   Renal/GU      Musculoskeletal   Abdominal   Peds  Hematology   Anesthesia Other Findings   Reproductive/Obstetrics                          Anesthesia Physical Anesthesia Plan  ASA: II  Anesthesia Plan: General   Post-op Pain Management:    Induction: Intravenous  Airway Management Planned: Oral ETT  Additional Equipment:   Intra-op Plan:   Post-operative Plan: Extubation in OR  Informed Consent: I have reviewed the patients History and Physical, chart, labs and discussed the procedure including the risks, benefits and alternatives for the proposed anesthesia with the patient or authorized representative who has indicated his/her understanding and acceptance.   Dental advisory given  Plan Discussed with: CRNA, Anesthesiologist and Surgeon  Anesthesia Plan Comments:        Anesthesia Quick Evaluation

## 2013-01-12 NOTE — Transfer of Care (Signed)
Immediate Anesthesia Transfer of Care Note  Patient: Shelly [name]  Procedure(s) Performed: Procedure(s): CARPAL TUNNEL RELEASE (Left) RELEASE TRIGGER FINGER/A-1 PULLEY (Left) INJECTION LEFT THUMB CMC JOINT (Left) RELEASE DORSAL COMPARTMENT (DEQUERVAIN) (Left)  Patient Location: PACU  Anesthesia Type:General  Level of Consciousness: awake, alert , oriented and patient cooperative  Airway & Oxygen Therapy: Patient Spontanous Breathing and Patient connected to face mask oxygen  Post-op Assessment: Report given to PACU RN and Post -op Vital signs reviewed and stable  Post vital signs: Reviewed and stable  Complications: No apparent anesthesia complications

## 2013-01-12 NOTE — Anesthesia Procedure Notes (Signed)
Procedure Name: LMA Insertion Date/Time: 01/12/2013 12:49 PM Performed by: Caren Macadam Pre-anesthesia Checklist: Patient identified, Emergency Drugs available, Suction available and Patient being monitored Patient Re-evaluated:Patient Re-evaluated prior to inductionOxygen Delivery Method: Circle System Utilized Preoxygenation: Pre-oxygenation with 100% oxygen Intubation Type: IV induction Ventilation: Mask ventilation without difficulty LMA: LMA inserted LMA Size: 4.0 Number of attempts: 1 Airway Equipment and Method: bite block Placement Confirmation: positive ETCO2 Tube secured with: Tape Dental Injury: Teeth and Oropharynx as per pre-operative assessment

## 2013-01-12 NOTE — Op Note (Signed)
[id number] 

## 2013-01-12 NOTE — Brief Op Note (Signed)
01/12/2013  1:45 PM  PATIENT:  Shelly [name]  52 y.o. female  PRE-OPERATIVE DIAGNOSIS:  LEFT CARPAL TUNNEL SYNDROME, LEFT TRIGGER THUMB, LEFT CMC LEFT DEQUERVAIN'S SYNDROME  POST-OPERATIVE DIAGNOSIS:  Left Carpal Tunnel Syndrome, Left Trigger finger, and Left Carpal Metacarpal Arthritis left DeQuervain's syndrome  PROCEDURE:  Procedure(s): CARPAL TUNNEL RELEASE (Left) RELEASE TRIGGER FINGER/A-1 PULLEY (Left) INJECTION LEFT THUMB CMC JOINT (Left) RELEASE DORSAL COMPARTMENT (DEQUERVAIN) (Left)  SURGEON:  Surgeon(s) and Role:    * Wyn Forster., MD - Primary  PHYSICIAN ASSISTANT:   ASSISTANTS: Surgical technician   ANESTHESIA:   general  EBL:  Total I/O In: 1500 [I.V.:1500] Out: -   BLOOD ADMINISTERED:none  DRAINS: none   LOCAL MEDICATIONS USED:  XYLOCAINE   SPECIMEN:  No Specimen  DISPOSITION OF SPECIMEN:  N/A  COUNTS:  YES  TOURNIQUET:   Total Tourniquet Time Documented: Upper Arm (Left) - 37 minutes Total: Upper Arm (Left) - 37 minutes   DICTATION: .Other Dictation: Dictation Number [id number]  PLAN OF CARE: Discharge to home after PACU  PATIENT DISPOSITION:  PACU - hemodynamically stable.   Delay start of Pharmacological VTE agent (>24hrs) due to surgical blood loss or risk of bleeding: not applicable

## 2013-01-12 NOTE — Anesthesia Postprocedure Evaluation (Signed)
  Anesthesia Post-op Note  Patient: Teona Vargus O'Steen  Procedure(s) Performed: Procedure(s): CARPAL TUNNEL RELEASE (Left) RELEASE TRIGGER FINGER/A-1 PULLEY (Left) INJECTION LEFT THUMB CMC JOINT (Left) RELEASE DORSAL COMPARTMENT (DEQUERVAIN) (Left)  Patient Location: PACU  Anesthesia Type:General  Level of Consciousness: awake and alert   Airway and Oxygen Therapy: Patient Spontanous Breathing and Patient connected to face mask oxygen  Post-op Pain: mild  Post-op Assessment: Post-op Vital signs reviewed, Patient's Cardiovascular Status Stable, Respiratory Function Stable, Patent Airway and No signs of Nausea or vomiting  Post-op Vital Signs: Reviewed and stable  Complications: No apparent anesthesia complications

## 2013-01-13 ENCOUNTER — Encounter (HOSPITAL_BASED_OUTPATIENT_CLINIC_OR_DEPARTMENT_OTHER): Payer: Self-pay | Admitting: Orthopedic Surgery

## 2013-01-13 NOTE — Op Note (Signed)
Shelly [name], Shelly [name]               ACCOUNT NO.:  [id number]  MEDICAL RECORD NO.:  [id number]  LOCATION:                                 FACILITY:  PHYSICIAN:  Katy Fitch. [name], M.D. DATE OF BIRTH:  [birth year]  DATE OF PROCEDURE:  01/12/2013 DATE OF DISCHARGE:  01/12/2013                              OPERATIVE REPORT   PREOPERATIVE DIAGNOSES: 1. Chronic left carpal tunnel syndrome. 2. Stenosing tenosynovitis, left thumb, unresponsive to injection     therapy. 3. Chronic stenosing tenosynovitis, left first dorsal compartment. 4. Symptomatic increasingly painful left thumb carpometacarpal     arthrosis.  POSTOPERATIVE DIAGNOSES: 1. Chronic left carpal tunnel syndrome. 2. Stenosing tenosynovitis, left thumb, unresponsive to injection     therapy. 3. Chronic stenosing tenosynovitis, left first dorsal compartment. 4. Symptomatic increasingly painful left thumb carpometacarpal     arthrosis.  OPERATION: 1. Release of left transverse carpal ligament. 2. Through a separate incision, decompression of left first dorsal     compartment. 3. Through a separate incision, release of left thumb A1 pulley. 4. Injection of left thumb carpometacarpal joint with 40 mg of Depo-     Medrol and 1 mL of 2% plain lidocaine.  OPERATING SURGEON:  Katy Fitch. Shloima Clinch, M.D.  ASSISTANT:  Surgical technician.  ANESTHESIA:  General by LMA.  SUPERVISING ANESTHESIOLOGIST:  Judie Petit, M.D.  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  Two years past, she presented for evaluation of hand numbness.  She was worked up and noted to have significant bilateral carpal tunnel syndrome.  She underwent a right carpal tunnel release.  She subsequently had other medical predicaments and postponed treatment to her left hand for nearly 2 years.  She returned recently with complaints of severe pain at the base of  her left thumb, pain at the first dorsal compartment, triggering of the thumb at the IP joint, and numbness in the median distribution.  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  We reviewed electrodiagnostic studies and noted bilateral carpal tunnel syndrome. The right side had been corrected, she was quite pleased with the long- term result.  We had detailed anesthesia and informed consent during which she was advised that we could proceed with release of her thumb A1 pulley first dorsal compartment and transverse carpal ligament as well as injector thumb for comfort of the CMC joint.  If her CMC pain continued to be problematic, she would be a possible candidate for [facility] reconstruction.  Questions were invited and answered in detail in the office.  She is brought to the operating room at this time after re-examination in the holding area.  We once again confirmed the 3 location pathology including stenosing tenosynovitis of the thumb at the A1 pulley, stenosing tenosynovitis of the first dorsal compartment and carpal tunnel syndrome.  PROCEDURE:  Barb Shear was brought to room 2 of the [facility] and placed in a supine position upon the operating table.  Following the induction of general  anesthesia by LMA technique under Dr. Randa Evens direct supervision, the left arm and hand were prepped with Betadine soap and solution, sterilely draped.  A pneumatic tourniquet was applied to the proximal left brachium.  Following exsanguination of the left arm with Esmarch bandage, the arterial tourniquet was inflated to 220 mmHg.  A routine surgical time- out was accomplished followed by an incision in the line of the distal wrist flexion crease directly over the palpably thickened first dorsal compartment.  Subcutaneous tissues were  carefully divided taking care to identify and gently retract the radial superficial sensory branches.  The compartment was thickened with a wall thickness of about 2 mm.  The tendons were carefully identified followed by incision of the compartment directly over the region between the extensor pollicis brevis and the abductor pollicis longus tendon slips.  The abductor pollicis longus was noted to have 2 slips, the extensor pollicis brevis 1 slip with the septum between.  The septum was removed with a rongeur.  Thereafter, free range of motion of the thumb metacarpal and MP joint was recovered.  The wound was inspected for bleeding points followed by repair of the skin with intradermal 3-0 Prolene and Steri-Strips.  Attention was then directed to the thumb.  Ms. Emrich has a tendency for hyperextension of the thumb MP joint of more than 35 degrees.  We gently positioned the thumb in neutral position and performed a transverse incision directly over the palpably thickened A1 pulley.  Subcutaneous tissues were carefully divided taking care to identify and gently retract the radial proper digital nerve.  The A1 pulley was isolated, split with scalpel and scissors.  Thereafter, free range of motion of the IP joint was recovered.  Attention was then directed to the proximal palm where a short incision was fashioned in the line of the ring finger. Subcutaneous tissues were carefully divided revealing thenar musculature, the extended ulnar towards the hypothenar eminence.  This was carefully teased off the transverse carpal ligament.  We did not identify any aberrant motor branches.  Once the transverse carpal ligament was identified along its ulnar border, a [place] 4 was passed through the carpal canal, and the transverse carpal ligament was released with scissors along its ulnar border off the hook of the hamate.  The volar forearm fascia was released subcutaneously.  The carpal  canal was widely opened.  The ulnar bursa was fibrotic and opaque.  No masses or other predicaments were noted.  The wound was then repaired with intradermal 3-0 Prolene suture as was the thumb A1 pulley release wound.  Steri-Strips were applied to all wounds followed by infiltration of 2% lidocaine for postoperative comfort.  Finally we injected the carpometacarpal joint of the thumb after identifying the joint line by palpation and applying 5 pounds traction.  A mixture of 40 mg of Depo- Medrol and 1 mL of plain 2% lidocaine was infiltrated into the joint capsule and within the joint space proper with a 27-gauge needle.  Excellent distention of the joint capsule was achieved.  There were no apparent complications.  Ms. Mcgough was placed in a compressive dressing with a volar plaster splint maintaining the wrist in 15 degrees of dorsiflexion.  For aftercare, she is provided a prescription for Percocet 5 mg 1 or 2 tablets p.o. q.4-6 hours p.r.n. pain, 20 tablets without refill.     Katy Fitch Kairon Shock, M.D.     RVS/MEDQ  D:  01/12/2013  T:  01/13/2013  Job:  [id number]

## 2013-02-21 ENCOUNTER — Emergency Department (HOSPITAL_COMMUNITY)
Admission: EM | Admit: 2013-02-21 | Discharge: 2013-02-21 | Disposition: A | Discharge status: Home / Self Care | Payer: BC Managed Care – PPO | Attending: Emergency Medicine | Admitting: Emergency Medicine

## 2013-02-21 ENCOUNTER — Emergency Department (HOSPITAL_COMMUNITY): Payer: BC Managed Care – PPO

## 2013-02-21 ENCOUNTER — Encounter (HOSPITAL_COMMUNITY): Payer: Self-pay | Admitting: *Deleted

## 2013-02-21 DIAGNOSIS — E039 Hypothyroidism, unspecified: Secondary | ICD-10-CM | POA: Insufficient documentation

## 2013-02-21 DIAGNOSIS — F17200 Nicotine dependence, unspecified, uncomplicated: Secondary | ICD-10-CM | POA: Insufficient documentation

## 2013-02-21 DIAGNOSIS — R112 Nausea with vomiting, unspecified: Secondary | ICD-10-CM | POA: Insufficient documentation

## 2013-02-21 DIAGNOSIS — K219 Gastro-esophageal reflux disease without esophagitis: Secondary | ICD-10-CM | POA: Insufficient documentation

## 2013-02-21 DIAGNOSIS — Z9889 Other specified postprocedural states: Secondary | ICD-10-CM | POA: Insufficient documentation

## 2013-02-21 DIAGNOSIS — Z8669 Personal history of other diseases of the nervous system and sense organs: Secondary | ICD-10-CM | POA: Insufficient documentation

## 2013-02-21 DIAGNOSIS — Z79899 Other long term (current) drug therapy: Secondary | ICD-10-CM | POA: Insufficient documentation

## 2013-02-21 DIAGNOSIS — R630 Anorexia: Secondary | ICD-10-CM | POA: Insufficient documentation

## 2013-02-21 DIAGNOSIS — Z90711 Acquired absence of uterus with remaining cervical stump: Secondary | ICD-10-CM | POA: Insufficient documentation

## 2013-02-21 DIAGNOSIS — R109 Unspecified abdominal pain: Secondary | ICD-10-CM | POA: Insufficient documentation

## 2013-02-21 LAB — URINALYSIS, ROUTINE W REFLEX MICROSCOPIC
Bilirubin Urine: NEGATIVE
Glucose, UA: NEGATIVE mg/dL
Ketones, ur: NEGATIVE mg/dL
Leukocytes, UA: NEGATIVE
pH: 7 (ref 5.0–8.0)

## 2013-02-21 LAB — COMPREHENSIVE METABOLIC PANEL
ALT: 21 U/L (ref 0–35)
AST: 19 U/L (ref 0–37)
Albumin: 3.6 g/dL (ref 3.5–5.2)
Alkaline Phosphatase: 65 U/L (ref 39–117)
Chloride: 103 mEq/L (ref 96–112)
Potassium: 4.2 mEq/L (ref 3.5–5.1)
Sodium: 139 mEq/L (ref 135–145)
Total Bilirubin: 0.3 mg/dL (ref 0.3–1.2)

## 2013-02-21 LAB — CBC WITH DIFFERENTIAL/PLATELET
Basophils Absolute: 0 10*3/uL (ref 0.0–0.1)
Basophils Relative: 1 % (ref 0–1)
MCHC: 34.1 g/dL (ref 30.0–36.0)
Neutro Abs: 5.2 10*3/uL (ref 1.7–7.7)
Neutrophils Relative %: 64 % (ref 43–77)
RDW: 14.4 % (ref 11.5–15.5)
WBC: 8.1 10*3/uL (ref 4.0–10.5)

## 2013-02-21 MED ORDER — SUCRALFATE 1 G PO TABS: 1.0000 g | ORAL_TABLET | Freq: Four times a day (QID) | ORAL | Status: DC

## 2013-02-21 MED ORDER — HYDROMORPHONE HCL PF 1 MG/ML IJ SOLN
0.5000 mg | Freq: Once | INTRAMUSCULAR | Status: AC
  Administered 2013-02-21: 0.5 mg via INTRAVENOUS
  Filled 2013-02-21: qty 1

## 2013-02-21 MED ORDER — IOHEXOL 300 MG/ML  SOLN
100.0000 mL | Freq: Once | INTRAMUSCULAR | Status: AC | PRN
  Administered 2013-02-21: 100 mL via INTRAVENOUS

## 2013-02-21 MED ORDER — ONDANSETRON HCL 4 MG/2ML IJ SOLN
4.0000 mg | Freq: Once | INTRAMUSCULAR | Status: AC
  Administered 2013-02-21: 4 mg via INTRAVENOUS
  Filled 2013-02-21: qty 2

## 2013-02-21 MED ORDER — ESOMEPRAZOLE MAGNESIUM 20 MG PO CPDR: 40.0000 mg | DELAYED_RELEASE_CAPSULE | Freq: Every day | ORAL | Status: DC

## 2013-02-21 MED ORDER — IOHEXOL 300 MG/ML  SOLN
50.0000 mL | Freq: Once | INTRAMUSCULAR | Status: AC | PRN
  Administered 2013-02-21: 50 mL via ORAL

## 2013-02-21 MED ORDER — SODIUM CHLORIDE 0.9 % IV SOLN
1000.0000 mL | Freq: Once | INTRAVENOUS | Status: AC
  Administered 2013-02-21: 1000 mL via INTRAVENOUS

## 2013-02-21 MED ORDER — OXYCODONE-ACETAMINOPHEN 5-325 MG PO TABS: ORAL_TABLET | ORAL | Status: DC

## 2013-02-21 NOTE — ED Provider Notes (Signed)
History    CSN: [id number] Arrival date & time 02/21/13  1457  First MD Initiated Contact with Patient 02/21/13 1514     No chief complaint on file.  (Consider location/radiation/quality/duration/timing/severity/associated sxs/prior Treatment) HPI Patient visits with abdominal pain, nausea, vomiting, anorexia. Symptoms began approximately 5 days ago, and initially were tolerable.  Over the past day his symptoms have become severe.  The pain is focally about the epigastrium and right upper quadrant.  The pain is largely nonradiating. No clear alleviating or exacerbating factors, though symptoms seem more present with food intake. No fever, no chills, no diarrhea, though the patient endorses loose stool. She is a notable history of prior resection of hepatic lobe, as well as hysterectomy and cholecystectomy.   Past Medical History  Diagnosis Date  . Hypothyroidism   . GERD (gastroesophageal reflux disease)   . Acid reflux   . Focal nodular hyperplasia of liver surgery in 2004    partial hepactectomy  . PONV (postoperative nausea and vomiting)   . Carpal tunnel syndrome   . Wears glasses    Past Surgical History  Procedure Laterality Date  . Partial hysterectomy    . Thyroidectomy, partial    . Carpal tunnel release      rt  . Tibia hardware removal  2011    rt ankle   . Esophagogastroduodenoscopy  03/05/2011    Procedure: ESOPHAGOGASTRODUODENOSCOPY (EGD);  Surgeon: Malissa Hippo, MD;  [facility];  Service: Endoscopy;  Laterality: N/A;  . Colonoscopy  03/05/2011    Procedure: COLONOSCOPY;  Surgeon: Malissa Hippo, MD;  Location: [facility];  Service: Endoscopy;  Laterality: N/A;  . Cholecystectomy  2004    liver resection benign tumor with GB  . Fracture surgery  2011    rt ankle  . Carpal tunnel release Left 01/12/2013    Procedure: CARPAL TUNNEL RELEASE;  Surgeon: Wyn Forster., MD;  Location: [facility];  Service: Orthopedics;   Laterality: Left;  . Trigger finger release Left 01/12/2013    Procedure: RELEASE TRIGGER FINGER/A-1 PULLEY;  Surgeon: Wyn Forster., MD;  Location: [facility];  Service: Orthopedics;  Laterality: Left;  . Steriod injection Left 01/12/2013    Procedure: INJECTION LEFT THUMB CMC JOINT;  Surgeon: Wyn Forster., MD;  Location: [facility];  Service: Orthopedics;  Laterality: Left;  . Dorsal compartment release Left 01/12/2013    Procedure: RELEASE DORSAL COMPARTMENT (DEQUERVAIN);  Surgeon: Wyn Forster., MD;  Location[facility];  Service: Orthopedics;  Laterality: Left;   Family History  Problem Relation Age of Onset  . Anesthesia problems Neg Hx   . Hypotension Neg Hx   . Malignant hyperthermia Neg Hx   . Pseudochol deficiency Neg Hx    History  Substance Use Topics  . Smoking status: Current Every Day Smoker -- 0.25 packs/day for .5 years    Types: Cigarettes  . Smokeless tobacco: Not on file  . Alcohol Use: Yes     Comment: occ   OB History   Grav Para Term Preterm Abortions TAB SAB Ect Mult Living                 Review of Systems  Constitutional:       Per HPI, otherwise negative  HENT:       Per HPI, otherwise negative  Respiratory:       Per HPI, otherwise negative  Cardiovascular:  Per HPI, otherwise negative  Gastrointestinal: Positive for nausea, vomiting and abdominal pain.  Endocrine:       Negative aside from HPI  Genitourinary:       Neg aside from HPI   Musculoskeletal:       Per HPI, otherwise negative  Skin: Negative.   Neurological: Negative for syncope.    Allergies  Nsaids and Sulfa antibiotics  Home Medications   Current Outpatient Rx  Name  Route  Sig  Dispense  Refill  . Cyanocobalamin (VITAMIN B-12 IJ)   Injection   Inject as directed 2 (two) times a week.         . esomeprazole (NEXIUM) 20 MG capsule   Oral   Take 20 mg by mouth daily before breakfast.         .  oxyCODONE-acetaminophen (PERCOCET/ROXICET) 5-325 MG per tablet      Take 1 or 2 tablets every 4-6 hours as needed for postoperative pain   30 tablet   0   . progesterone (PROMETRIUM) 200 MG capsule   Oral   Take 200 mg by mouth at bedtime.         Marland Kitchen thyroid (ARMOUR) 60 MG tablet   Oral   Take 120 mg by mouth every evening.          Marland Kitchen UNABLE TO FIND      daily. TESTOSTERONE 4% APPLY 1 CLICK TOPICALLY EACH DAY         . UNABLE TO FIND      5 mg daily. Bi-Est (7:3) 5mg /gr APPLY 4 CLICKS EACH DAY          BP 144/86  Pulse 89  Temp(Src) 97.9 F (36.6 C) (Oral)  Resp 18  SpO2 98% Physical Exam  Nursing note and vitals reviewed. Constitutional: She is oriented to person, place, and time. She appears well-developed and well-nourished. No distress.  HENT:  Head: Normocephalic and atraumatic.  Eyes: Conjunctivae and EOM are normal.  Cardiovascular: Normal rate and regular rhythm.   Pulmonary/Chest: Effort normal and breath sounds normal. No stridor. No respiratory distress.  Abdominal: Soft. She exhibits no distension. There is tenderness in the right upper quadrant, right lower quadrant and suprapubic area.  Musculoskeletal: She exhibits no edema.  Neurological: She is alert and oriented to person, place, and time. No cranial nerve deficit.  Skin: Skin is warm and dry.  Psychiatric: She has a normal mood and affect.    ED Course  Procedures (including critical care time) Labs Reviewed  COMPREHENSIVE METABOLIC PANEL - Abnormal; Notable for the following:    Glucose, Bld 100 (*)    All other components within normal limits  CBC WITH DIFFERENTIAL  LIPASE, BLOOD  URINALYSIS, ROUTINE W REFLEX MICROSCOPIC   No results found. No diagnosis found. Pulse ox 99% room air normal   6:40 PM Patient is in no distress on repeat exam. Discuss all results and follow up instructions MDM  This patient presents with days of abdominal pain.  She is a notable history of  multiple surgical procedures, and with restriction of new abdominal pain or some suspicion of SBO versus appendicitis.  The patient is in no distress on my exam, with stable vital signs.  The patient's evaluation here is largely reassuring.  Though this may represent an early infectious process, with no leukocytosis, no fever no enteric antibiotics were provided.  Return precautions were provided and the patient was discharged to follow up with her gastroenterologist and her primary care  physician.  Gerhard Munch, MD 02/21/13 [phone]

## 2013-02-21 NOTE — ED Notes (Signed)
Pt presents to ed with c/o n/v/d. It started 4-5 days ago with intermittent right side mid abdominal pain, today pt sts constant nausea, multiple episodes of emesis, 1 episode of diarrhea. Denies fever.

## 2013-04-19 ENCOUNTER — Encounter (INDEPENDENT_AMBULATORY_CARE_PROVIDER_SITE_OTHER): Payer: Self-pay | Admitting: *Deleted

## 2013-06-12 ENCOUNTER — Encounter (INDEPENDENT_AMBULATORY_CARE_PROVIDER_SITE_OTHER): Payer: Self-pay | Admitting: Internal Medicine

## 2013-06-12 ENCOUNTER — Ambulatory Visit (INDEPENDENT_AMBULATORY_CARE_PROVIDER_SITE_OTHER): Payer: BC Managed Care – PPO | Admitting: Internal Medicine

## 2013-06-12 VITALS — BP 118/78 | HR 80 | Temp 97.6°F | Resp 20 | Ht 67.0 in | Wt 220.5 lb

## 2013-06-12 DIAGNOSIS — K219 Gastro-esophageal reflux disease without esophagitis: Secondary | ICD-10-CM

## 2013-06-12 MED ORDER — FAMOTIDINE 20 MG PO TABS: 20.0000 mg | ORAL_TABLET | Freq: Every day | ORAL | Status: DC | PRN

## 2013-06-12 NOTE — Patient Instructions (Signed)
Call if diarrhea relapses. Take Pepcid 20 mg by mouth the evening on an as-needed basis. Call if you are not able to obtain domperidone

## 2013-06-12 NOTE — Progress Notes (Signed)
Presenting complaint;  Followup for chronic GERD. Patient also complains of diarrhea.  Subjective:  Patient is 52 year old Caucasian female who has chronic GERD complicated by short segment Barrett's esophagus who underwent esophageal dilation in August last year for dysphagia. She was also noted to have erosive reflux esophagitis. Nexium dose was doubled to 40 mg daily. She say she is doing better but she is taking 20 mg of Nexium on most days. Higher dose results in headache. She is having intermittent heartburn and regurgitation particularly at night and having to use Reglan which is not very often. Reglan does help but then she is not able to sleep. She has dysphagia maybe once a month primarily with meat and the food bolus always passes down within 2-3 minutes. Overall her swallowing is much better compared to last year. She also complains of diarrhea which started last fall. She was seen at urgent care and felt to have an infection and treated with Cipro and dicyclomine. She stopped Cipro after a few doses because she developed generalized soreness and muscle pain. She did not feel any better with dicyclomine which she has stopped. She was having postprandial loose stools but all of a sudden diarrhea has resolved and she's been having normal stools for the last 3 days. 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 She has gained 10 pounds since her last visit. She states she actually has lost a few pounds since she has changed her eating habits.  Current Medications: Current Outpatient Prescriptions  Medication Sig Dispense Refill  . esomeprazole (NEXIUM) 20 MG capsule Take 2 capsules (40 mg total) by mouth daily before breakfast.  30 capsule  0  . L-Lysine HCl 500 MG CAPS Take by mouth 3 (three) times daily.      Marland Kitchen MAGNESIUM GLYCINATE PLUS PO Take 200 mg by mouth 2 (two) times daily at 10 AM and 5 PM.      . MILK THISTLE PO Take by mouth 2 (two)  times daily.      . NON FORMULARY 2 (two) times daily. Natokinase - for inflammation      . NON FORMULARY daily. NAC Lcysteine      . NON FORMULARY daily. MMB 5 mg /2,.5 mg/50 mg      . Omega-3 Fatty Acids (OMEGA III EPA+DHA) 1000 MG CAPS Take by mouth 2 (two) times daily.      . Probiotic Product (PROBIOTIC DAILY PO) Take by mouth daily.      . progesterone (PROMETRIUM) 200 MG capsule Take 400 mg by mouth at bedtime.       Marland Kitchen thyroid (ARMOUR) 60 MG tablet Take 120 mg by mouth every evening.       . TURMERIC CURCUMIN PO Take by mouth daily.      Marland Kitchen UNABLE TO FIND daily. TESTOSTERONE 4% APPLY 1 CLICK TOPICALLY EACH DAY      . UNABLE TO FIND 5 mg daily. Bi-Est (7:3) 5mg /gr APPLY 4 CLICKS EACH DAY      . VITAMIN D, CHOLECALCIFEROL, PO Take by mouth. Patient states that she takes 15 drops daily       No current facility-administered medications for this visit.     Objective: Blood pressure 118/78, pulse 80, temperature 97.6 F (36.4 C), temperature source Oral, resp. rate 20, height 5\' 7"  (1.702 m), weight 220 lb 8 oz (100.018 kg). Patient is alert and in no acute distress. Conjunctiva is pink. Sclera is nonicteric Oropharyngeal mucosa  is normal. No neck masses or thyromegaly noted. Cardiac exam with regular rhythm normal S1 and S2. No murmur or gallop noted. Lungs are clear to auscultation. Abdomen is full. Bowel sounds are normal. Abdomen is soft with mild midepigastric tenderness. No organomegaly or masses noted.  No LE edema or clubbing noted.    Assessment:  #1. GERD complicated by short segment Barrett's. Symptoms are not well controlled with Nexium at 20 mg by mouth every morning and 40 mg dose gives her a headache. She may benefit from when necessary age to be in the evening. She should also consider using domperidone instead of metoclopramide if she is able to obtain domperidone. #2. History of diarrhea. 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   Plan:  Continue Nexium at 20 mg by mouth every morning. Pepcid OTC 20 mg by mouth daily when necessary. Domperidone 10 mg by mouth before evening meal or at bedtime daily if she is able to obtain this medication. Patient is aware that this is not an FDA approved medication has better safety profile than metoclopramide. She will call if diarrhea relapses otherwise return for office visit in one year.

## 2013-09-11 ENCOUNTER — Other Ambulatory Visit: Payer: Self-pay | Admitting: Orthopedic Surgery

## 2013-09-11 DIAGNOSIS — M25561 Pain in right knee: Secondary | ICD-10-CM

## 2013-09-12 ENCOUNTER — Ambulatory Visit
Admission: RE | Admit: 2013-09-12 | Discharge: 2013-09-12 | Disposition: A | Discharge status: Home / Self Care | Payer: BC Managed Care – PPO | Source: Ambulatory Visit | Attending: Orthopedic Surgery | Admitting: Orthopedic Surgery

## 2013-09-12 DIAGNOSIS — M25561 Pain in right knee: Secondary | ICD-10-CM

## 2013-09-27 ENCOUNTER — Ambulatory Visit (HOSPITAL_COMMUNITY)
Admission: RE | Admit: 2013-09-27 | Discharge: 2013-09-27 | Disposition: A | Discharge status: Home / Self Care | Payer: BC Managed Care – PPO | Source: Ambulatory Visit | Attending: Cardiovascular Disease | Admitting: Cardiovascular Disease

## 2013-09-27 ENCOUNTER — Other Ambulatory Visit (HOSPITAL_COMMUNITY): Payer: Self-pay | Admitting: Orthopedic Surgery

## 2013-09-27 DIAGNOSIS — M79609 Pain in unspecified limb: Secondary | ICD-10-CM | POA: Insufficient documentation

## 2013-09-27 DIAGNOSIS — I82409 Acute embolism and thrombosis of unspecified deep veins of unspecified lower extremity: Secondary | ICD-10-CM

## 2013-09-27 DIAGNOSIS — M7989 Other specified soft tissue disorders: Secondary | ICD-10-CM

## 2013-09-27 DIAGNOSIS — I82509 Chronic embolism and thrombosis of unspecified deep veins of unspecified lower extremity: Secondary | ICD-10-CM

## 2013-09-27 NOTE — Progress Notes (Signed)
Right Lower Ext. Venous Duplex Completed.  Negative DVT.

## 2013-10-06 ENCOUNTER — Ambulatory Visit (HOSPITAL_COMMUNITY): Payer: BC Managed Care – PPO | Attending: Cardiology

## 2013-10-06 ENCOUNTER — Other Ambulatory Visit (HOSPITAL_COMMUNITY): Payer: Self-pay | Admitting: *Deleted

## 2013-10-06 DIAGNOSIS — M7989 Other specified soft tissue disorders: Secondary | ICD-10-CM | POA: Insufficient documentation

## 2013-10-06 DIAGNOSIS — R609 Edema, unspecified: Secondary | ICD-10-CM | POA: Insufficient documentation

## 2013-10-06 DIAGNOSIS — M79609 Pain in unspecified limb: Secondary | ICD-10-CM | POA: Insufficient documentation

## 2014-02-18 ENCOUNTER — Emergency Department (HOSPITAL_COMMUNITY)
Admission: EM | Admit: 2014-02-18 | Discharge: 2014-02-18 | Disposition: A | Discharge status: Home / Self Care | Payer: BC Managed Care – PPO | Attending: Emergency Medicine | Admitting: Emergency Medicine

## 2014-02-18 ENCOUNTER — Encounter (HOSPITAL_COMMUNITY): Payer: Self-pay | Admitting: Emergency Medicine

## 2014-02-18 ENCOUNTER — Emergency Department (HOSPITAL_COMMUNITY): Payer: BC Managed Care – PPO

## 2014-02-18 DIAGNOSIS — K219 Gastro-esophageal reflux disease without esophagitis: Secondary | ICD-10-CM | POA: Insufficient documentation

## 2014-02-18 DIAGNOSIS — Z79899 Other long term (current) drug therapy: Secondary | ICD-10-CM | POA: Insufficient documentation

## 2014-02-18 DIAGNOSIS — M549 Dorsalgia, unspecified: Secondary | ICD-10-CM | POA: Insufficient documentation

## 2014-02-18 DIAGNOSIS — Z8669 Personal history of other diseases of the nervous system and sense organs: Secondary | ICD-10-CM | POA: Insufficient documentation

## 2014-02-18 DIAGNOSIS — Z9089 Acquired absence of other organs: Secondary | ICD-10-CM | POA: Insufficient documentation

## 2014-02-18 DIAGNOSIS — R109 Unspecified abdominal pain: Secondary | ICD-10-CM

## 2014-02-18 DIAGNOSIS — Z90710 Acquired absence of both cervix and uterus: Secondary | ICD-10-CM | POA: Insufficient documentation

## 2014-02-18 DIAGNOSIS — F17200 Nicotine dependence, unspecified, uncomplicated: Secondary | ICD-10-CM | POA: Insufficient documentation

## 2014-02-18 DIAGNOSIS — E039 Hypothyroidism, unspecified: Secondary | ICD-10-CM | POA: Insufficient documentation

## 2014-02-18 DIAGNOSIS — Z9889 Other specified postprocedural states: Secondary | ICD-10-CM | POA: Insufficient documentation

## 2014-02-18 DIAGNOSIS — R110 Nausea: Secondary | ICD-10-CM | POA: Insufficient documentation

## 2014-02-18 LAB — HEPATIC FUNCTION PANEL
ALT: 32 U/L (ref 0–35)
AST: 23 U/L (ref 0–37)
Albumin: 3.7 g/dL (ref 3.5–5.2)
Alkaline Phosphatase: 63 U/L (ref 39–117)
BILIRUBIN TOTAL: 0.3 mg/dL (ref 0.3–1.2)
Total Protein: 6.8 g/dL (ref 6.0–8.3)

## 2014-02-18 LAB — URINALYSIS, ROUTINE W REFLEX MICROSCOPIC
BILIRUBIN URINE: NEGATIVE
Glucose, UA: NEGATIVE mg/dL
Hgb urine dipstick: NEGATIVE
KETONES UR: NEGATIVE mg/dL
LEUKOCYTES UA: NEGATIVE
Nitrite: NEGATIVE
PH: 6 (ref 5.0–8.0)
PROTEIN: NEGATIVE mg/dL
Specific Gravity, Urine: <1.005 — ABNORMAL LOW (ref 1.005–1.030)
Urobilinogen, UA: 0.2 mg/dL (ref 0.0–1.0)

## 2014-02-18 LAB — CBC WITH DIFFERENTIAL/PLATELET
BASOS ABS: 0 10*3/uL (ref 0.0–0.1)
Basophils Relative: 0 % (ref 0–1)
EOS ABS: 0.1 10*3/uL (ref 0.0–0.7)
EOS PCT: 1 % (ref 0–5)
HCT: 38.4 % (ref 36.0–46.0)
Hemoglobin: 13.2 g/dL (ref 12.0–15.0)
Lymphocytes Relative: 31 % (ref 12–46)
Lymphs Abs: 1.9 10*3/uL (ref 0.7–4.0)
MCH: 30.1 pg (ref 26.0–34.0)
MCHC: 34.4 g/dL (ref 30.0–36.0)
MCV: 87.7 fL (ref 78.0–100.0)
MONO ABS: 0.5 10*3/uL (ref 0.1–1.0)
Monocytes Relative: 7 % (ref 3–12)
Neutro Abs: 3.8 10*3/uL (ref 1.7–7.7)
Neutrophils Relative %: 61 % (ref 43–77)
PLATELETS: 233 10*3/uL (ref 150–400)
RBC: 4.38 MIL/uL (ref 3.87–5.11)
RDW: 13.3 % (ref 11.5–15.5)
WBC: 6.3 10*3/uL (ref 4.0–10.5)

## 2014-02-18 LAB — BASIC METABOLIC PANEL
Anion gap: 13 (ref 5–15)
BUN: 9 mg/dL (ref 6–23)
CALCIUM: 9.2 mg/dL (ref 8.4–10.5)
CO2: 26 meq/L (ref 19–32)
CREATININE: 0.63 mg/dL (ref 0.50–1.10)
Chloride: 103 mEq/L (ref 96–112)
GFR calc Af Amer: >90 mL/min (ref >90)
Glucose, Bld: 101 mg/dL — ABNORMAL HIGH (ref 70–99)
Potassium: 3.8 mEq/L (ref 3.7–5.3)
SODIUM: 142 meq/L (ref 137–147)

## 2014-02-18 LAB — LIPASE, BLOOD: Lipase: 23 U/L (ref 11–59)

## 2014-02-18 MED ORDER — ONDANSETRON 4 MG PO TBDP: 4.0000 mg | ORAL_TABLET | Freq: Three times a day (TID) | ORAL | Status: DC | PRN

## 2014-02-18 MED ORDER — ONDANSETRON HCL 4 MG/2ML IJ SOLN
4.0000 mg | Freq: Once | INTRAMUSCULAR | Status: AC
  Administered 2014-02-18: 4 mg via INTRAVENOUS

## 2014-02-18 MED ORDER — SODIUM CHLORIDE 0.9 % IJ SOLN
INTRAMUSCULAR | Status: AC
  Filled 2014-02-18: qty 350

## 2014-02-18 MED ORDER — HYDROMORPHONE HCL PF 1 MG/ML IJ SOLN
1.0000 mg | Freq: Once | INTRAMUSCULAR | Status: AC
  Administered 2014-02-18: 1 mg via INTRAVENOUS
  Filled 2014-02-18: qty 1

## 2014-02-18 MED ORDER — SODIUM CHLORIDE 0.9 % IV BOLUS (SEPSIS)
250.0000 mL | Freq: Once | INTRAVENOUS | Status: AC
  Administered 2014-02-18: 250 mL via INTRAVENOUS

## 2014-02-18 MED ORDER — IOHEXOL 300 MG/ML  SOLN
100.0000 mL | Freq: Once | INTRAMUSCULAR | Status: AC | PRN
  Administered 2014-02-18: 100 mL via INTRAVENOUS

## 2014-02-18 MED ORDER — SODIUM CHLORIDE 0.9 % IV SOLN
INTRAVENOUS | Status: DC
  Administered 2014-02-18: 16:00:00 via INTRAVENOUS

## 2014-02-18 MED ORDER — ONDANSETRON HCL 4 MG/2ML IJ SOLN
INTRAMUSCULAR | Status: AC
  Administered 2014-02-18: 4 mg via INTRAVENOUS
  Filled 2014-02-18: qty 2

## 2014-02-18 MED ORDER — IOHEXOL 300 MG/ML  SOLN
50.0000 mL | Freq: Once | INTRAMUSCULAR | Status: AC | PRN
  Administered 2014-02-18: 50 mL via ORAL

## 2014-02-18 MED ORDER — ONDANSETRON HCL 4 MG/2ML IJ SOLN
4.0000 mg | Freq: Once | INTRAMUSCULAR | Status: AC
  Administered 2014-02-18: 4 mg via INTRAVENOUS
  Filled 2014-02-18: qty 2

## 2014-02-18 NOTE — ED Notes (Signed)
Pt finished contrast. Ct aware. 

## 2014-02-18 NOTE — ED Notes (Signed)
Pt c/o sharp abd pain that started suddenly yesterday with nausea, pt states that it feels like when you have to go to the bathroom suddenly. Did have bowel movement this am and was normal but pain has continued,

## 2014-02-18 NOTE — ED Provider Notes (Signed)
CSN: [id number]     Arrival date & time 02/18/14  1444 History  This chart was scribed for Fredia Sorrow, MD by Girtha Hake, ED Scribe. The patient was seen in [facility]. The patient's care was started at 3:52 PM.     Chief Complaint  Patient presents with  . Abdominal Pain    Patient is a 53 y.o. female presenting with abdominal pain. The history is provided by the patient. No language interpreter was used.  Abdominal Pain Pain location:  RLQ Pain quality: cramping   Pain radiates to:  Back Pain severity now: 7/10. Timing:  Intermittent Progression since onset: becoming morefrequent. Associated symptoms: nausea   Associated symptoms: no chest pain, no chills, no cough, no diarrhea, no dysuria, no fever, no shortness of breath, no sore throat and no vomiting    HPI Comments: Shelly [name] is a 53 y.o. female with a history of ovarian cyst, cholecystomy, partial liver resection, and hysterectomy who presents to the Emergency Department complaining of cramping, intermittent RLQ abdominal pain beginning yesterday morning. Patient reports that the pain is becoming more frequent. She rates the pain as a 7/10 currently. She reports associated nausea. Patient reports that the pain radiates to her back. She denies vomiting, diarrhea, fever, chills, visual changes, cold symptoms, CP, SOB, dysuria, leg swelling, rash, HA, or history of bleeding easily.   PCP is Dr. Melina Copa in [place].  Past Medical History  Diagnosis Date  . Hypothyroidism   . GERD (gastroesophageal reflux disease)   . Acid reflux   . Focal nodular hyperplasia of liver surgery in 2004    partial hepactectomy  . PONV (postoperative nausea and vomiting)   . Carpal tunnel syndrome   . Wears glasses    Past Surgical History  Procedure Laterality Date  . Partial hysterectomy    . Thyroidectomy, partial    . Carpal tunnel release      rt  . Tibia hardware removal  2011    rt ankle   . Esophagogastroduodenoscopy   03/05/2011    Procedure: ESOPHAGOGASTRODUODENOSCOPY (EGD);  Surgeon: Rogene Houston, MD;  [facility];  Service: Endoscopy;  Laterality: N/A;  . Colonoscopy  03/05/2011    Procedure: COLONOSCOPY;  Surgeon: Rogene Houston, MD;  [facility];  Service: Endoscopy;  Laterality: N/A;  . Cholecystectomy  2004    liver resection benign tumor with GB  . Fracture surgery  2011    rt ankle  . Carpal tunnel release Left 01/12/2013    Procedure: CARPAL TUNNEL RELEASE;  Surgeon: Cammie Sickle., MD;  Location: [facility];  Service: Orthopedics;  Laterality: Left;  . Trigger finger release Left 01/12/2013    Procedure: RELEASE TRIGGER FINGER/A-1 PULLEY;  Surgeon: Cammie Sickle., MD;  Location: [facility];  Service: Orthopedics;  Laterality: Left;  . Steriod injection Left 01/12/2013    Procedure: INJECTION LEFT THUMB [place] JOINT;  Surgeon: Cammie Sickle., MD;  Location: [facility];  Service: Orthopedics;  Laterality: Left;  . Dorsal compartment release Left 01/12/2013    Procedure: RELEASE DORSAL COMPARTMENT (DEQUERVAIN);  Surgeon: Cammie Sickle., MD;  Location: [facility];  Service: Orthopedics;  Laterality: Left;   Family History  Problem Relation Age of Onset  . Anesthesia problems Neg Hx   . Hypotension Neg Hx   . Malignant hyperthermia Neg Hx   . Pseudochol deficiency Neg Hx    History  Substance Use Topics  . Smoking status: Current Every Day Smoker -- 0.25 packs/day for .5 years    Types: Cigarettes  . Smokeless tobacco: Never Used  . Alcohol Use: Yes     Comment: occ   OB History   Grav Para Term Preterm Abortions TAB SAB Ect Mult Living                 Review of Systems  Constitutional: Negative for fever and chills.  HENT: Negative for rhinorrhea and sore throat.   Eyes: Negative for visual disturbance.  Respiratory: Negative for cough and shortness of breath.   Cardiovascular:  Negative for chest pain and leg swelling.  Gastrointestinal: Positive for nausea and abdominal pain. Negative for vomiting and diarrhea.  Genitourinary: Negative for dysuria.  Musculoskeletal: Positive for back pain. Negative for neck pain.  Skin: Negative for rash.  Neurological: Negative for dizziness, light-headedness and headaches.  Hematological: Does not bruise/bleed easily.  Psychiatric/Behavioral: Negative for confusion.      Allergies  Ciprofloxacin; Eggs or egg-derived products; Nsaids; Other; Sulfa antibiotics; and Wheat bran  Home Medications   Prior to Admission medications   Medication Sig Start Date End Date Taking? Authorizing Provider  esomeprazole (NEXIUM) 20 MG capsule Take 2 capsules (40 mg total) by mouth daily before breakfast. 02/21/13  Yes Carmin Muskrat, MD  estradiol (MINIVELLE) 0.075 MG/24HR Place 1 patch onto the skin 2 (two) times a week.   Yes Historical Provider, MD  MAGNESIUM GLYCINATE PLUS PO Take 200 mg by mouth 2 (two) times daily at 10 AM and 5 PM.   Yes Historical Provider, MD  MILK THISTLE PO Take by mouth 2 (two) times daily.   Yes Historical Provider, MD  NALTREXONE HCL PO Take 3 mg by mouth daily.   Yes Historical Provider, MD  NON FORMULARY Take 1 capsule by mouth daily. Natokinase - for inflammation   Yes Historical Provider, MD  NON FORMULARY Apply 1 application topically daily. B:D Cream   Yes Historical Provider, MD  Omega-3 Fatty Acids (OMEGA III EPA+DHA) 1000 MG CAPS Take 1 capsule by mouth 3 (three) times daily.    Yes Historical Provider, MD  Probiotic Product (PROBIOTIC DAILY PO) Take 1 tablet by mouth daily.    Yes Historical Provider, MD  progesterone (PROMETRIUM) 100 MG capsule Take 100 mg by mouth at bedtime.   Yes Historical Provider, MD  thyroid (ARMOUR) 60 MG tablet Take 150 mg by mouth daily.   Yes Historical Provider, MD  TURMERIC CURCUMIN PO Take by mouth every other day.    Yes Historical Provider, MD   Triage Vitals: BP  140/89  Pulse 89  Temp(Src) 97.4 F (36.3 C) (Oral)  Resp 20  Ht 5\' 6"  (1.676 m)  Wt 215 lb (97.523 kg)  BMI 34.72 kg/m2  SpO2 99% Physical Exam  Nursing note and vitals reviewed. Constitutional: She is oriented to person, place, and time. She appears well-developed and well-nourished. No distress.  HENT:  Head: Normocephalic and atraumatic.  Eyes: Conjunctivae and EOM are normal.  Neck: Neck supple. No tracheal deviation present.  Cardiovascular: Normal rate and regular rhythm.   Pulmonary/Chest: Effort normal and breath sounds normal. No respiratory distress. She has no wheezes. She has no rales.  Abdominal: Soft. Bowel sounds are normal. There is no tenderness.  Musculoskeletal: Normal range of motion. She exhibits no edema and no tenderness.  Neurological: She is alert and oriented to person, place, and time. No cranial nerve deficit.  Skin: Skin  is warm and dry.  Psychiatric: She has a normal mood and affect. Her behavior is normal.    ED Course  Procedures (including critical care time) DIAGNOSTIC STUDIES: Oxygen Saturation is 99% on room air, normal by my interpretation.    COORDINATION OF CARE: 3:58 PM-Discussed treatment plan which includes Zofran and labs with pt at bedside and pt agreed to plan.     Labs Review Labs Reviewed  BASIC METABOLIC PANEL - Abnormal; Notable for the following:    Glucose, Bld 101 (*)    All other components within normal limits  URINALYSIS, ROUTINE W REFLEX MICROSCOPIC - Abnormal; Notable for the following:    Specific Gravity, Urine <1.005 (*)    All other components within normal limits  CBC WITH DIFFERENTIAL  HEPATIC FUNCTION PANEL  LIPASE, BLOOD   Results for orders placed during the hospital encounter of 02/18/14  CBC WITH DIFFERENTIAL      Result Value Ref Range   WBC 6.3  4.0 - 10.5 K/uL   RBC 4.38  3.87 - 5.11 MIL/uL   Hemoglobin 13.2  12.0 - 15.0 g/dL   HCT 38.4  36.0 - 46.0 %   MCV 87.7  78.0 - 100.0 fL   MCH 30.1   26.0 - 34.0 pg   MCHC 34.4  30.0 - 36.0 g/dL   RDW 13.3  11.5 - 15.5 %   Platelets 233  150 - 400 K/uL   Neutrophils Relative % 61  43 - 77 %   Neutro Abs 3.8  1.7 - 7.7 K/uL   Lymphocytes Relative 31  12 - 46 %   Lymphs Abs 1.9  0.7 - 4.0 K/uL   Monocytes Relative 7  3 - 12 %   Monocytes Absolute 0.5  0.1 - 1.0 K/uL   Eosinophils Relative 1  0 - 5 %   Eosinophils Absolute 0.1  0.0 - 0.7 K/uL   Basophils Relative 0  0 - 1 %   Basophils Absolute 0.0  0.0 - 0.1 K/uL  BASIC METABOLIC PANEL      Result Value Ref Range   Sodium 142  137 - 147 mEq/L   Potassium 3.8  3.7 - 5.3 mEq/L   Chloride 103  96 - 112 mEq/L   CO2 26  19 - 32 mEq/L   Glucose, Bld 101 (*) 70 - 99 mg/dL   BUN 9  6 - 23 mg/dL   Creatinine, Ser 0.63  0.50 - 1.10 mg/dL   Calcium 9.2  8.4 - 10.5 mg/dL   GFR calc non Af Amer >90  >90 mL/min   GFR calc Af Amer >90  >90 mL/min   Anion gap 13  5 - 15  URINALYSIS, ROUTINE W REFLEX MICROSCOPIC      Result Value Ref Range   Color, Urine YELLOW  YELLOW   APPearance CLEAR  CLEAR   Specific Gravity, Urine <1.005 (*) 1.005 - 1.030   pH 6.0  5.0 - 8.0   Glucose, UA NEGATIVE  NEGATIVE mg/dL   Hgb urine dipstick NEGATIVE  NEGATIVE   Bilirubin Urine NEGATIVE  NEGATIVE   Ketones, ur NEGATIVE  NEGATIVE mg/dL   Protein, ur NEGATIVE  NEGATIVE mg/dL   Urobilinogen, UA 0.2  0.0 - 1.0 mg/dL   Nitrite NEGATIVE  NEGATIVE   Leukocytes, UA NEGATIVE  NEGATIVE  HEPATIC FUNCTION PANEL      Result Value Ref Range   Total Protein 6.8  6.0 - 8.3 g/dL   Albumin 3.7  3.5 - 5.2 g/dL   AST 23  0 - 37 U/L   ALT 32  0 - 35 U/L   Alkaline Phosphatase 63  39 - 117 U/L   Total Bilirubin 0.3  0.3 - 1.2 mg/dL   Bilirubin, Direct <0.2  0.0 - 0.3 mg/dL   Indirect Bilirubin NOT CALCULATED  0.3 - 0.9 mg/dL  LIPASE, BLOOD      Result Value Ref Range   Lipase 23  11 - 59 U/L     Imaging Review Ct Abdomen Pelvis W Contrast  02/18/2014   CLINICAL DATA:  Right lower quadrant pain.  EXAM: CT ABDOMEN  AND PELVIS WITH CONTRAST  TECHNIQUE: Multidetector CT imaging of the abdomen and pelvis was performed using the standard protocol following bolus administration of intravenous contrast.  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 Linear subsegmental atelectasis dependently in the lung bases. No effusions.  Diffuse fatty infiltration of the liver, stable. Prior cholecystectomy. Spleen, pancreas, adrenals and kidneys are normal.  Prior hysterectomy. No adnexal masses. Urinary bladder is unremarkable. Stomach, large and small bowel are unremarkable. Appendix is visualized and is normal. No free fluid, free air or adenopathy.  No acute bony abnormality or focal bone lesion. 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  IMPRESSION: Diffuse fatty infiltration scratch head diffuse fatty infiltration of the liver, stable.   Electronically Signed   By: Rolm Baptise M.D.   On: 02/18/2014 17:46     EKG Interpretation None      MDM   Final diagnoses:  Abdominal pain, unspecified abdominal location    Workup for the abdominal pain without any specific findings. CT scan is negative. Notice of any ovarian problems. No evidence of bowel structure. Patient's white blood cell count is normal. Liver function test are normal lipase is no evidence of pancreatitis. Will discharge home with pain medicine sure he has at home and antinausea measure he has at home and followup with her GI Dr.    I personally performed the services described in this documentation, which was scribed in my presence. The recorded information has been reviewed and is accurate.     Fredia Sorrow, MD 02/18/14 [phone]

## 2014-02-18 NOTE — Discharge Instructions (Signed)
Today's workup without any specific cause for the abdominal pain. Labs also normal. Recommend taking a pain medication at home and Zofran for nausea and vomiting. Return for any new or worse symptoms. Call your GI Dr. for followup if not resolved.

## 2014-02-21 ENCOUNTER — Encounter (INDEPENDENT_AMBULATORY_CARE_PROVIDER_SITE_OTHER): Payer: Self-pay | Admitting: *Deleted

## 2014-06-12 ENCOUNTER — Ambulatory Visit (INDEPENDENT_AMBULATORY_CARE_PROVIDER_SITE_OTHER): Payer: BC Managed Care – PPO | Admitting: Internal Medicine

## 2014-06-12 ENCOUNTER — Encounter (INDEPENDENT_AMBULATORY_CARE_PROVIDER_SITE_OTHER): Payer: Self-pay | Admitting: Internal Medicine

## 2014-06-12 VITALS — BP 130/70 | HR 76 | Temp 97.8°F | Ht 66.0 in | Wt 225.3 lb

## 2014-06-12 DIAGNOSIS — R110 Nausea: Secondary | ICD-10-CM

## 2014-06-12 DIAGNOSIS — K2100 Gastro-esophageal reflux disease with esophagitis, without bleeding: Secondary | ICD-10-CM

## 2014-06-12 MED ORDER — ONDANSETRON HCL 4 MG PO TABS: 4.0000 mg | ORAL_TABLET | Freq: Three times a day (TID) | ORAL | Status: DC | PRN

## 2014-06-12 NOTE — Progress Notes (Signed)
Subjective:    Patient ID: Shelly [name], female    DOB: [birth year], 53 y.o.   MRN: [id number]  HPI Here today for f/u of her GERD.  Acid reflux controlled with Nexium. Hx of erosive reflux esophagitis with Schatzki ring. Short segment Barrett's esophagus.  She tells me she is doing good.  Appetite is good. No weight loss. She has gained 5 pounds since her last visit last year.  She usually has a BM daily. No diarrhea. She says she is walking daily except in the rain.   Hepatic Function Panel     Component Value Date/Time   PROT 6.8 02/18/2014 1546   ALBUMIN 3.7 02/18/2014 1546   AST 23 02/18/2014 1546   ALT 32 02/18/2014 1546   ALKPHOS 63 02/18/2014 1546   BILITOT 0.3 02/18/2014 1546   BILIDIR <0.2 02/18/2014 1546   IBILI NOT CALCULATED 02/18/2014 1546    CBC    Component Value Date/Time   WBC 6.3 02/18/2014 1546   RBC 4.38 02/18/2014 1546   HGB 13.2 02/18/2014 1546   HCT 38.4 02/18/2014 1546   PLT 233 02/18/2014 1546   MCV 87.7 02/18/2014 1546   MCH 30.1 02/18/2014 1546   MCHC 34.4 02/18/2014 1546   RDW 13.3 02/18/2014 1546   LYMPHSABS 1.9 02/18/2014 1546   MONOABS 0.5 02/18/2014 1546   EOSABS 0.1 02/18/2014 1546   BASOSABS 0.0 02/18/2014 1546     04/15/2012 EGD:  Impression:  Erosive reflux esophagitis with Schatzki's ring.  Short segment Barrett's esophagus.  Small sliding hiatal hernia.  Shots these ring disrupted by passing 42 [place] Maloney dilator and also with focal biopsy.  Review of Systems Past Medical History  Diagnosis Date  . Hypothyroidism   . GERD (gastroesophageal reflux disease)   . Acid reflux   . Focal nodular hyperplasia of liver surgery in 2004    partial hepactectomy  . PONV (postoperative nausea and vomiting)   . Carpal tunnel syndrome   . Wears glasses     Past Surgical History  Procedure Laterality Date  . Partial hysterectomy    . Thyroidectomy, partial    . Carpal tunnel release      rt  . Tibia hardware removal  2011    rt ankle   .  Esophagogastroduodenoscopy  03/05/2011    Procedure: ESOPHAGOGASTRODUODENOSCOPY (EGD);  Surgeon: Rogene Houston, MD;  [facility];  Service: Endoscopy;  Laterality: N/A;  . Colonoscopy  03/05/2011    Procedure: COLONOSCOPY;  Surgeon: Rogene Houston, MD;  [facility];  Service: Endoscopy;  Laterality: N/A;  . Cholecystectomy  2004    liver resection benign tumor with GB  . Fracture surgery  2011    rt ankle  . Carpal tunnel release Left 01/12/2013    Procedure: CARPAL TUNNEL RELEASE;  Surgeon: Cammie Sickle., MD;  Location: [facility];  Service: Orthopedics;  Laterality: Left;  . Trigger finger release Left 01/12/2013    Procedure: RELEASE TRIGGER FINGER/A-1 PULLEY;  Surgeon: Cammie Sickle., MD;  Location: [facility];  Service: Orthopedics;  Laterality: Left;  . Steriod injection Left 01/12/2013    Procedure: INJECTION LEFT THUMB [place] JOINT;  Surgeon: Cammie Sickle., MD;  Location: [place] [facility];  Service: Orthopedics;  Laterality: Left;  . Dorsal compartment release Left 01/12/2013    Procedure: RELEASE DORSAL COMPARTMENT (DEQUERVAIN);  Surgeon: Cammie Sickle., MD;  Location: [facility];  Service: Orthopedics;  Laterality: Left;    Allergies  Allergen Reactions  . Ciprofloxacin     Patient states that it was neuro pain , could hardly walk,.  . Dairy Aid [Lactase]   . Eggs Or Egg-Derived Products   . Nsaids Other (See Comments)    Causes Gastric Problems   . Other     gluten  . Sulfa Antibiotics Hives  . Wheat Bran     Current Outpatient Prescriptions on File Prior to Visit  Medication Sig Dispense Refill  . esomeprazole (NEXIUM) 20 MG capsule Take 2 capsules (40 mg total) by mouth daily before breakfast.  30 capsule  0  . MAGNESIUM GLYCINATE PLUS PO Take 200 mg by mouth as needed.       . Omega-3 Fatty Acids (OMEGA III EPA+DHA) 1000 MG CAPS Take 1 capsule by mouth 3 (three) times daily.        . ondansetron (ZOFRAN ODT) 4 MG disintegrating tablet Take 1 tablet (4 mg total) by mouth every 8 (eight) hours as needed.  12 tablet  1  . Probiotic Product (PROBIOTIC DAILY PO) Take 1 tablet by mouth daily.       . progesterone (PROMETRIUM) 100 MG capsule Take 100 mg by mouth at bedtime.      Marland Kitchen thyroid (ARMOUR) 60 MG tablet Take 120 mg by mouth daily.        No current facility-administered medications on file prior to visit.        Objective:   Physical Exam Filed Vitals:   06/12/14 1538  BP: 130/70  Pulse: 76  Temp: 97.8 F (36.6 C)  Height: 5\' 6"  (1.676 m)  Weight: 225 lb 4.8 oz (102.195 kg)   Alert and oriented. Skin warm and dry. Oral mucosa is moist.   . Sclera anicteric, conjunctivae is pink. Thyroid not enlarged. No cervical lymphadenopathy. Lungs clear. Heart regular rate and rhythm.  Abdomen is soft. Bowel sounds are positive. No hepatomegaly. No abdominal masses felt. No tenderness.  No edema to lower extremities.         Assessment & Plan:  GERD controlled with Nexium. Will need possible a surveillance EGD next yerar. OV in 1 year.

## 2014-06-18 ENCOUNTER — Other Ambulatory Visit: Payer: Self-pay | Admitting: Orthopedic Surgery

## 2014-06-18 ENCOUNTER — Ambulatory Visit
Admission: RE | Admit: 2014-06-18 | Discharge: 2014-06-18 | Disposition: A | Discharge status: Home / Self Care | Payer: 59 | Source: Ambulatory Visit | Attending: Orthopedic Surgery | Admitting: Orthopedic Surgery

## 2014-06-18 DIAGNOSIS — M25562 Pain in left knee: Secondary | ICD-10-CM

## 2014-06-25 ENCOUNTER — Other Ambulatory Visit: Payer: Self-pay

## 2014-07-05 ENCOUNTER — Other Ambulatory Visit: Payer: Self-pay | Admitting: Orthopedic Surgery

## 2014-07-19 ENCOUNTER — Encounter (HOSPITAL_BASED_OUTPATIENT_CLINIC_OR_DEPARTMENT_OTHER): Payer: Self-pay | Admitting: *Deleted

## 2014-07-19 NOTE — Progress Notes (Signed)
Pt has been here several times-no labs needed

## 2014-07-24 ENCOUNTER — Encounter (HOSPITAL_BASED_OUTPATIENT_CLINIC_OR_DEPARTMENT_OTHER): Payer: Self-pay

## 2014-07-24 ENCOUNTER — Ambulatory Visit (HOSPITAL_BASED_OUTPATIENT_CLINIC_OR_DEPARTMENT_OTHER): Payer: 59 | Admitting: Certified Registered"

## 2014-07-24 ENCOUNTER — Ambulatory Visit (HOSPITAL_BASED_OUTPATIENT_CLINIC_OR_DEPARTMENT_OTHER)
Admission: RE | Admit: 2014-07-24 | Discharge: 2014-07-24 | Disposition: A | Discharge status: Home / Self Care | Payer: 59 | Source: Ambulatory Visit | Attending: Orthopedic Surgery | Admitting: Orthopedic Surgery

## 2014-07-24 ENCOUNTER — Encounter (HOSPITAL_BASED_OUTPATIENT_CLINIC_OR_DEPARTMENT_OTHER)
Admission: RE | Disposition: A | Discharge status: Home / Self Care | Payer: Self-pay | Source: Ambulatory Visit | Attending: Orthopedic Surgery

## 2014-07-24 DIAGNOSIS — K7689 Other specified diseases of liver: Secondary | ICD-10-CM | POA: Insufficient documentation

## 2014-07-24 DIAGNOSIS — E039 Hypothyroidism, unspecified: Secondary | ICD-10-CM | POA: Insufficient documentation

## 2014-07-24 DIAGNOSIS — G709 Myoneural disorder, unspecified: Secondary | ICD-10-CM | POA: Diagnosis not present

## 2014-07-24 DIAGNOSIS — Z87891 Personal history of nicotine dependence: Secondary | ICD-10-CM | POA: Insufficient documentation

## 2014-07-24 DIAGNOSIS — Z888 Allergy status to other drugs, medicaments and biological substances status: Secondary | ICD-10-CM | POA: Diagnosis not present

## 2014-07-24 DIAGNOSIS — Z91012 Allergy to eggs: Secondary | ICD-10-CM | POA: Insufficient documentation

## 2014-07-24 DIAGNOSIS — K219 Gastro-esophageal reflux disease without esophagitis: Secondary | ICD-10-CM | POA: Insufficient documentation

## 2014-07-24 DIAGNOSIS — M19042 Primary osteoarthritis, left hand: Secondary | ICD-10-CM | POA: Diagnosis not present

## 2014-07-24 DIAGNOSIS — Z882 Allergy status to sulfonamides status: Secondary | ICD-10-CM | POA: Diagnosis not present

## 2014-07-24 DIAGNOSIS — Z91011 Allergy to milk products: Secondary | ICD-10-CM | POA: Diagnosis not present

## 2014-07-24 DIAGNOSIS — M797 Fibromyalgia: Secondary | ICD-10-CM | POA: Insufficient documentation

## 2014-07-24 DIAGNOSIS — Z8249 Family history of ischemic heart disease and other diseases of the circulatory system: Secondary | ICD-10-CM | POA: Insufficient documentation

## 2014-07-24 DIAGNOSIS — Z91018 Allergy to other foods: Secondary | ICD-10-CM | POA: Insufficient documentation

## 2014-07-24 DIAGNOSIS — G5602 Carpal tunnel syndrome, left upper limb: Secondary | ICD-10-CM | POA: Insufficient documentation

## 2014-07-24 HISTORY — PX: CARPOMETACARPEL SUSPENSION PLASTY: SHX5005

## 2014-07-24 HISTORY — DX: Unspecified osteoarthritis, unspecified site: M19.90

## 2014-07-24 HISTORY — DX: Adverse effect of unspecified general anesthetics, initial encounter: T41.205A

## 2014-07-24 LAB — POCT HEMOGLOBIN-HEMACUE: Hemoglobin: 14 g/dL (ref 12.0–15.0)

## 2014-07-24 SURGERY — CARPOMETACARPEL (CMC) SUSPENSION PLASTY
Anesthesia: General | Site: Thumb | Laterality: Left

## 2014-07-24 MED ORDER — FENTANYL CITRATE 0.05 MG/ML IJ SOLN
50.0000 ug | INTRAMUSCULAR | Status: DC | PRN
  Administered 2014-07-24: 100 ug via INTRAVENOUS

## 2014-07-24 MED ORDER — OXYCODONE HCL 5 MG/5ML PO SOLN: 5.0000 mg | Freq: Once | ORAL | Status: AC | PRN

## 2014-07-24 MED ORDER — ROPIVACAINE HCL 5 MG/ML IJ SOLN
INTRAMUSCULAR | Status: DC | PRN
  Administered 2014-07-24: 30 mL via PERINEURAL

## 2014-07-24 MED ORDER — HYDROCODONE-ACETAMINOPHEN 5-325 MG PO TABS: ORAL_TABLET | ORAL | Status: DC

## 2014-07-24 MED ORDER — CHLORHEXIDINE GLUCONATE 4 % EX LIQD: 60.0000 mL | Freq: Once | CUTANEOUS | Status: DC

## 2014-07-24 MED ORDER — OXYCODONE HCL 5 MG PO TABS
5.0000 mg | ORAL_TABLET | Freq: Once | ORAL | Status: AC | PRN
  Administered 2014-07-24: 5 mg via ORAL

## 2014-07-24 MED ORDER — FENTANYL CITRATE 0.05 MG/ML IJ SOLN
INTRAMUSCULAR | Status: AC
  Filled 2014-07-24: qty 2

## 2014-07-24 MED ORDER — HYDROMORPHONE HCL 1 MG/ML IJ SOLN
INTRAMUSCULAR | Status: AC
  Filled 2014-07-24: qty 1

## 2014-07-24 MED ORDER — FENTANYL CITRATE 0.05 MG/ML IJ SOLN
INTRAMUSCULAR | Status: DC | PRN
  Administered 2014-07-24 (×2): 25 ug via INTRAVENOUS

## 2014-07-24 MED ORDER — MEPERIDINE HCL 25 MG/ML IJ SOLN: 6.2500 mg | INTRAMUSCULAR | Status: DC | PRN

## 2014-07-24 MED ORDER — OXYCODONE HCL 5 MG PO TABS
ORAL_TABLET | ORAL | Status: AC
  Filled 2014-07-24: qty 1

## 2014-07-24 MED ORDER — MIDAZOLAM HCL 2 MG/2ML IJ SOLN
INTRAMUSCULAR | Status: AC
  Filled 2014-07-24: qty 2

## 2014-07-24 MED ORDER — PROPOFOL 10 MG/ML IV BOLUS
INTRAVENOUS | Status: DC | PRN
  Administered 2014-07-24: 200 mg via INTRAVENOUS

## 2014-07-24 MED ORDER — ONDANSETRON HCL 4 MG/2ML IJ SOLN
INTRAMUSCULAR | Status: DC | PRN
  Administered 2014-07-24: 4 mg via INTRAVENOUS

## 2014-07-24 MED ORDER — CEFAZOLIN SODIUM-DEXTROSE 2-3 GM-% IV SOLR
INTRAVENOUS | Status: AC
  Filled 2014-07-24: qty 50

## 2014-07-24 MED ORDER — LACTATED RINGERS IV SOLN
INTRAVENOUS | Status: DC
  Administered 2014-07-24 (×2): via INTRAVENOUS

## 2014-07-24 MED ORDER — HYDROMORPHONE HCL 2 MG PO TABS: 2.0000 mg | ORAL_TABLET | Freq: Four times a day (QID) | ORAL | Status: DC | PRN

## 2014-07-24 MED ORDER — DEXAMETHASONE SODIUM PHOSPHATE 10 MG/ML IJ SOLN
INTRAMUSCULAR | Status: DC | PRN
  Administered 2014-07-24: 10 mg via INTRAVENOUS

## 2014-07-24 MED ORDER — FENTANYL CITRATE 0.05 MG/ML IJ SOLN
INTRAMUSCULAR | Status: AC
  Filled 2014-07-24: qty 6

## 2014-07-24 MED ORDER — HYDROMORPHONE HCL 1 MG/ML IJ SOLN
0.2500 mg | INTRAMUSCULAR | Status: DC | PRN
  Administered 2014-07-24: 0.25 mg via INTRAVENOUS

## 2014-07-24 MED ORDER — LIDOCAINE HCL (CARDIAC) 20 MG/ML IV SOLN
INTRAVENOUS | Status: DC | PRN
  Administered 2014-07-24: 75 mg via INTRAVENOUS

## 2014-07-24 MED ORDER — CEFAZOLIN SODIUM-DEXTROSE 2-3 GM-% IV SOLR
2.0000 g | INTRAVENOUS | Status: AC
  Administered 2014-07-24: 2 g via INTRAVENOUS

## 2014-07-24 MED ORDER — MIDAZOLAM HCL 2 MG/2ML IJ SOLN
1.0000 mg | INTRAMUSCULAR | Status: DC | PRN
  Administered 2014-07-24: 2 mg via INTRAVENOUS

## 2014-07-24 MED ORDER — PROMETHAZINE HCL 25 MG/ML IJ SOLN: 6.2500 mg | INTRAMUSCULAR | Status: DC | PRN

## 2014-07-24 SURGICAL SUPPLY — 74 items
BANDAGE ELASTIC 3 VELCRO ST LF (GAUZE/BANDAGES/DRESSINGS) ×1 IMPLANT
BLADE MINI RND TIP GREEN BEAV (BLADE) ×1 IMPLANT
BLADE SURG 15 STRL LF DISP TIS (BLADE) ×2 IMPLANT
BLADE SURG 15 STRL SS (BLADE) ×4
BNDG CMPR 9X4 STRL LF SNTH (GAUZE/BANDAGES/DRESSINGS) ×1
BNDG CMPR MD 5X2 ELC HKLP STRL (GAUZE/BANDAGES/DRESSINGS)
BNDG ELASTIC 2 VLCR STRL LF (GAUZE/BANDAGES/DRESSINGS) IMPLANT
BNDG ESMARK 4X9 LF (GAUZE/BANDAGES/DRESSINGS) ×2 IMPLANT
BNDG GAUZE ELAST 4 BULKY (GAUZE/BANDAGES/DRESSINGS) ×2 IMPLANT
CHLORAPREP W/TINT 26ML (MISCELLANEOUS) ×2 IMPLANT
CORDS BIPOLAR (ELECTRODE) ×2 IMPLANT
COVER BACK TABLE 60X90IN (DRAPES) ×2 IMPLANT
COVER MAYO STAND STRL (DRAPES) ×2 IMPLANT
CUFF TOURNIQUET SINGLE 18IN (TOURNIQUET CUFF) ×2 IMPLANT
DECANTER SPIKE VIAL GLASS SM (MISCELLANEOUS) IMPLANT
DRAPE EXTREMITY T 121X128X90 (DRAPE) ×2 IMPLANT
DRAPE OEC MINIVIEW 54X84 (DRAPES) ×1 IMPLANT
DRAPE SURG 17X23 STRL (DRAPES) ×2 IMPLANT
DRSG PAD ABDOMINAL 8X10 ST (GAUZE/BANDAGES/DRESSINGS) IMPLANT
GAUZE SPONGE 4X4 12PLY STRL (GAUZE/BANDAGES/DRESSINGS) ×2 IMPLANT
GAUZE XEROFORM 1X8 LF (GAUZE/BANDAGES/DRESSINGS) ×2 IMPLANT
GLOVE BIO SURGEON STRL SZ 6.5 (GLOVE) ×1 IMPLANT
GLOVE BIO SURGEON STRL SZ7.5 (GLOVE) ×2 IMPLANT
GLOVE BIOGEL PI IND STRL 7.0 (GLOVE) IMPLANT
GLOVE BIOGEL PI IND STRL 8 (GLOVE) ×1 IMPLANT
GLOVE BIOGEL PI INDICATOR 7.0 (GLOVE) ×1
GLOVE BIOGEL PI INDICATOR 8 (GLOVE) ×1
GLOVE EXAM NITRILE MD LF STRL (GLOVE) ×1 IMPLANT
GLOVE SURG ORTHO 8.0 STRL STRW (GLOVE) ×1 IMPLANT
GOWN STRL REUS W/ TWL LRG LVL3 (GOWN DISPOSABLE) ×1 IMPLANT
GOWN STRL REUS W/TWL LRG LVL3 (GOWN DISPOSABLE) ×2
GOWN STRL REUS W/TWL XL LVL3 (GOWN DISPOSABLE) ×3 IMPLANT
K-WIRE .035X4 (WIRE) IMPLANT
NDL HYPO 25X1 1.5 SAFETY (NEEDLE) IMPLANT
NDL KEITH (NEEDLE) IMPLANT
NDL SAFETY ECLIPSE 18X1.5 (NEEDLE) IMPLANT
NEEDLE HYPO 18GX1.5 SHARP (NEEDLE)
NEEDLE HYPO 22GX1.5 SAFETY (NEEDLE) IMPLANT
NEEDLE HYPO 25X1 1.5 SAFETY (NEEDLE) IMPLANT
NEEDLE KEITH (NEEDLE) IMPLANT
NS IRRIG 1000ML POUR BTL (IV SOLUTION) ×2 IMPLANT
PACK BASIN DAY SURGERY FS (CUSTOM PROCEDURE TRAY) ×2 IMPLANT
PAD CAST 3X4 CTTN HI CHSV (CAST SUPPLIES) ×1 IMPLANT
PAD CAST 4YDX4 CTTN HI CHSV (CAST SUPPLIES) IMPLANT
PADDING CAST ABS 4INX4YD NS (CAST SUPPLIES)
PADDING CAST ABS COTTON 4X4 ST (CAST SUPPLIES) ×1 IMPLANT
PADDING CAST COTTON 3X4 STRL (CAST SUPPLIES) ×2
PADDING CAST COTTON 4X4 STRL (CAST SUPPLIES)
PASSER SUT SWANSON 36MM LOOP (INSTRUMENTS) IMPLANT
SLEEVE SCD COMPRESS KNEE MED (MISCELLANEOUS) ×1 IMPLANT
SLING ARM FOAM STRAP LRG (SOFTGOODS) ×1 IMPLANT
SPLINT FAST PLASTER 5X30 (CAST SUPPLIES)
SPLINT PLASTER CAST FAST 5X30 (CAST SUPPLIES) IMPLANT
SPLINT PLASTER CAST XFAST 4X15 (CAST SUPPLIES) IMPLANT
SPLINT PLASTER XTRA FAST SET 4 (CAST SUPPLIES)
STAPLER VISISTAT 35W (STAPLE) IMPLANT
STOCKINETTE 4X48 STRL (DRAPES) ×2 IMPLANT
SUT ETHIBOND 3-0 V-5 (SUTURE) ×2 IMPLANT
SUT ETHILON 3 0 PS 1 (SUTURE) IMPLANT
SUT ETHILON 4 0 PS 2 18 (SUTURE) ×2 IMPLANT
SUT FIBERWIRE 2-0 18 17.9 3/8 (SUTURE)
SUT FIBERWIRE 3-0 18 TAPR NDL (SUTURE) ×2
SUT MERSILENE 2.0 SH NDLE (SUTURE) IMPLANT
SUT MERSILENE 4 0 P 3 (SUTURE) IMPLANT
SUT SILK 4 0 PS 2 (SUTURE) IMPLANT
SUT VIC AB 0 SH 27 (SUTURE) IMPLANT
SUT VICRYL 4-0 PS2 18IN ABS (SUTURE) IMPLANT
SUTURE FIBERWR 2-0 18 17.9 3/8 (SUTURE) IMPLANT
SUTURE FIBERWR 3-0 18 TAPR NDL (SUTURE) IMPLANT
SYR BULB 3OZ (MISCELLANEOUS) ×2 IMPLANT
SYR CONTROL 10ML LL (SYRINGE) IMPLANT
TOWEL OR 17X24 6PK STRL BLUE (TOWEL DISPOSABLE) ×4 IMPLANT
TOWEL OR NON WOVEN STRL DISP B (DISPOSABLE) ×2 IMPLANT
UNDERPAD 30X30 INCONTINENT (UNDERPADS AND DIAPERS) ×2 IMPLANT

## 2014-07-24 NOTE — Discharge Instructions (Addendum)
[facility] Instructions °Hand Surgery ° °Wound Care: °Keep your hand elevated above the level of your heart.  Do not allow it to dangle by your side.  Keep the dressing dry and do not remove it unless your doctor advises you to do so.  He will usually change it at the time of your post-op visit.  Moving your fingers is advised to stimulate circulation but will depend on the site of your surgery.  If you have a splint applied, your doctor will advise you regarding movement. ° °Activity: °Do not drive or operate machinery today.  Rest today and then you may return to your normal activity and work as indicated by your physician. ° °Diet:  °Drink liquids today or eat a light diet.  You may resume a regular diet tomorrow.   ° °General expectations: °Pain for two to three days. °Fingers may become slightly swollen. ° °Call your doctor if any of the following occur: °Severe pain not relieved by pain medication. °Elevated temperature. °Dressing soaked with blood. °Inability to move fingers. °White or bluish color to fingers. ° ° °Regional Anesthesia Blocks ° °1. Numbness or the inability to move the "blocked" extremity may last from 3-48 hours after placement. The length of time depends on the medication injected and your individual response to the medication. If the numbness is not going away after 48 hours, call your surgeon. ° °2. The extremity that is blocked will need to be protected until the numbness is gone and the  Strength has returned. Because you cannot feel it, you will need to take extra care to avoid injury. Because it may be weak, you may have difficulty moving it or using it. You may not know what position it is in without looking at it while the block is in effect. ° °3. For blocks in the legs and feet, returning to weight bearing and walking needs to be done carefully. You will need to wait until the numbness is entirely gone and the strength has returned. You should be able to move your leg and foot  normally before you try and bear weight or walk. You will need someone to be with you when you first try to ensure you do not fall and possibly risk injury. ° °4. Bruising and tenderness at the needle site are common side effects and will resolve in a few days. ° °5. Persistent numbness or new problems with movement should be communicated to the surgeon or the [facility] ([phone])/ [facility] ([phone]). ° ° °Post Anesthesia Home Care Instructions ° °Activity: °Get plenty of rest for the remainder of the day. A responsible adult should stay with you for 24 hours following the procedure.  °For the next 24 hours, DO NOT: °-Drive a car °-Operate machinery °-Drink alcoholic beverages °-Take any medication unless instructed by your physician °-Make any legal decisions or sign important papers. ° °Meals: °Start with liquid foods such as gelatin or soup. Progress to regular foods as tolerated. Avoid greasy, spicy, heavy foods. If nausea and/or vomiting occur, drink only clear liquids until the nausea and/or vomiting subsides. Call your physician if vomiting continues. ° °Special Instructions/Symptoms: °Your throat may feel dry or sore from the anesthesia or the breathing tube placed in your throat during surgery. If this causes discomfort, gargle with warm salt water. The discomfort should disappear within 24 hours. ° °

## 2014-07-24 NOTE — Op Note (Addendum)
Intra-operative fluoroscopic images in the [facility], lateral, and oblique views were taken and evaluated by myself.  The trapezium was identified and subsequently removed.  Appropriate suspension of the thumb metacarpal was achieved.

## 2014-07-24 NOTE — Transfer of Care (Signed)
Immediate Anesthesia Transfer of Care Note  Patient: Shelly [name]  Procedure(s) Performed: Procedure(s): LEFT THUMB CARPOMETACARPEL ([place]) ARTHROPLASTY GARCIA ELIAS  (Left)  Patient Location: PACU  Anesthesia Type:GA combined with regional for post-op pain  Level of Consciousness: awake, oriented, sedated and patient cooperative  Airway & Oxygen Therapy: Patient Spontanous Breathing and Patient connected to face mask oxygen  Post-op Assessment: Report given to PACU RN and Post -op Vital signs reviewed and stable  Post vital signs: Reviewed and stable  Complications: No apparent anesthesia complications

## 2014-07-24 NOTE — Brief Op Note (Signed)
07/24/2014  1:47 PM  PATIENT:  Juliette Alcide O'Steen  53 y.o. 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   POST-OPERATIVE DIAGNOSIS:  END STAGE OSTEOARTHRITIS LEFT THUMB   PROCEDURE:  Procedure(s): LEFT THUMB CARPOMETACARPEL ([place]) ARTHROPLASTY GARCIA ELIAS  (Left)  SURGEON:  Surgeon(s) and Role:    * Leanora Cover, MD - Primary    * Daryll Brod, MD - Assisting  PHYSICIAN ASSISTANT:   ASSISTANTS: Daryll Brod, MD   ANESTHESIA:   regional and general  EBL:  Total I/O In: 1000 [I.V.:1000] Out: -   BLOOD ADMINISTERED:none  DRAINS: none   LOCAL MEDICATIONS USED:  NONE  SPECIMEN:  No Specimen  DISPOSITION OF SPECIMEN:  N/A  COUNTS:  YES  TOURNIQUET:   Total Tourniquet Time Documented: Upper Arm (Left) - 70 minutes Total: Upper Arm (Left) - 70 minutes   DICTATION: .Other Dictation: Dictation Number [id number]  PLAN OF CARE: Discharge to home after PACU  PATIENT DISPOSITION:  PACU - hemodynamically stable.

## 2014-07-24 NOTE — Anesthesia Postprocedure Evaluation (Signed)
  Anesthesia Post-op Note  Patient: Shelly [name]  Procedure(s) Performed: Procedure(s): LEFT THUMB CARPOMETACARPEL ([place]) ARTHROPLASTY GARCIA ELIAS  (Left)  Patient Location: PACU  Anesthesia Type: General   Level of Consciousness: awake, alert  and oriented  Airway and Oxygen Therapy: Patient Spontanous Breathing  Post-op Pain: mild  Post-op Assessment: Post-op Vital signs reviewed  Post-op Vital Signs: Reviewed  Last Vitals:  Filed Vitals:   07/24/14 1528  BP: 145/89  Pulse: 87  Temp: 36.8 C  Resp: 16    Complications: No apparent anesthesia complications

## 2014-07-24 NOTE — Anesthesia Procedure Notes (Signed)
Anesthesia Regional Block:  Supraclavicular block  Pre-Anesthetic Checklist: ,, timeout performed, Correct Patient, Correct Site, Correct Laterality, Correct Procedure, Correct Position, site marked, Risks and benefits discussed,  Surgical consent,  Pre-op evaluation,  At surgeon's request and post-op pain management   Prep: chloraprep       Needles:   Needle Type: Echogenic Needle     Needle Length: 9cm 9 cm   Needle insertion depth: 5 cm   Additional Needles:  Procedures: ultrasound guided (picture in chart) and nerve stimulator Supraclavicular block Narrative:  Start time: 07/24/2014 11:50 AM End time: 07/24/2014 12:05 PM Injection made incrementally with aspirations every 5 mL.  Performed by: Personally  Anesthesiologist: Lonzy Mato  Additional Notes: toleratd well

## 2014-07-24 NOTE — Op Note (Signed)
[id number] 

## 2014-07-24 NOTE — Progress Notes (Signed)
Assisted Dr. Massagee with left, ultrasound guided, supraclavicular block. Side rails up, monitors on throughout procedure. See vital signs in flow sheet. Tolerated Procedure well. 

## 2014-07-24 NOTE — H&P (Signed)
Shelly [name] is an 53 y.o. female.   Chief Complaint: left thumb pain HPI: 53 yo rhd female with pain at base left thumb x years worsened recently.  Has had injections and splinting by Dr. Daylene Katayama.  Continued pain in thumb.  Worse with gripping activities.  Past Medical History  Diagnosis Date  . Hypothyroidism   . GERD (gastroesophageal reflux disease)   . Acid reflux   . Focal nodular hyperplasia of liver surgery in 2004    partial hepactectomy  . Carpal tunnel syndrome   . Wears glasses   . PONV (postoperative nausea and vomiting)   . Adverse effect of general anesthetic     shivering recently-had to have demerol  . Arthritis     Past Surgical History  Procedure Laterality Date  . Partial hysterectomy    . Thyroidectomy, partial    . Carpal tunnel release      rt  . Tibia hardware removal  2011    rt ankle   . Esophagogastroduodenoscopy  03/05/2011    Procedure: ESOPHAGOGASTRODUODENOSCOPY (EGD);  Surgeon: Rogene Houston, MD;  [facility];  Service: Endoscopy;  Laterality: N/A;  . Colonoscopy  03/05/2011    Procedure: COLONOSCOPY;  Surgeon: Rogene Houston, MD;  [facility];  Service: Endoscopy;  Laterality: N/A;  . Cholecystectomy  2004    liver resection benign tumor with GB  . Fracture surgery  2011    rt ankle  . Carpal tunnel release Left 01/12/2013    Procedure: CARPAL TUNNEL RELEASE;  Surgeon: Cammie Sickle., MD;  Location: [place];  Service: Orthopedics;  Laterality: Left;  . Trigger finger release Left 01/12/2013    Procedure: RELEASE TRIGGER FINGER/A-1 PULLEY;  Surgeon: Cammie Sickle., MD;  Location: [place];  Service: Orthopedics;  Laterality: Left;  . Steriod injection Left 01/12/2013    Procedure: INJECTION LEFT THUMB [place] JOINT;  Surgeon: Cammie Sickle., MD;  Location: [place];  Service: Orthopedics;  Laterality: Left;  . Dorsal compartment release Left 01/12/2013   Procedure: RELEASE DORSAL COMPARTMENT (DEQUERVAIN);  Surgeon: Cammie Sickle., MD;  Location: [facility];  Service: Orthopedics;  Laterality: Left;    Family History  Problem Relation Age of Onset  . Anesthesia problems Neg Hx   . Hypotension Neg Hx   . Malignant hyperthermia Neg Hx   . Pseudochol deficiency Neg Hx    Social History:  reports that she quit smoking about a year ago. Her smoking use included Cigarettes. She has a .125 pack-year smoking history. She has never used smokeless tobacco. She reports that she drinks alcohol. She reports that she does not use illicit drugs.  Allergies:  Allergies  Allergen Reactions  . Ciprofloxacin     Patient states that it was neuro pain , could hardly walk,.  . Dairy Aid [Lactase]   . Eggs Or Egg-Derived Products   . Nsaids Other (See Comments)    Causes Gastric Problems   . Other     gluten  . Sulfa Antibiotics Hives  . Wheat Bran     No prescriptions prior to admission    No results found for this or any previous visit (from the past 48 hour(s)).  No results found.   A comprehensive review of systems was negative except for: Eyes: positive for contacts/glasses Neurological: positive for gait problems and headaches  Height 5\' 6"  (1.676 m), weight 95.255 kg (  210 lb).  General appearance: alert, cooperative and appears stated age Head: Normocephalic, without obvious abnormality, atraumatic Neck: supple, symmetrical, trachea midline Resp: clear to auscultation bilaterally Cardio: regular rate and rhythm GI: non tender Extremities: intact sensation and capillary refill all digits.  +epl/fpl/io.  ttp left thumb cmc.  no wounds. Pulses: 2+ and symmetric Skin: Skin color, texture, turgor normal. No rashes or lesions Neurologic: Grossly normal Incision/Wound: none  Assessment/Plan Left thumb cmc arthritis.  Non operative and operative treatment options were discussed with the patient and patient wishes to  proceed with operative treatment. Risks, benefits, and alternatives of surgery were discussed and the patient agrees with the plan of care.   Tamirra Sienkiewicz R 07/24/2014, 7:32 AM

## 2014-07-24 NOTE — Anesthesia Preprocedure Evaluation (Signed)
Anesthesia Evaluation  Patient identified by MRN, date of birth, ID band Patient awake    Reviewed: Allergy & Precautions, NPO status , Patient's Chart, lab work & pertinent test results  History of Anesthesia Complications (+) PONV and history of anesthetic complications  Airway Mallampati: II   Neck ROM: Full    Dental  (+) Teeth Intact   Pulmonary neg pulmonary ROS, former smoker,  breath sounds clear to auscultation        Cardiovascular Rhythm:Regular Rate:Normal     Neuro/Psych  Neuromuscular disease    GI/Hepatic GERD-  ,  Endo/Other  Hypothyroidism   Renal/GU      Musculoskeletal  (+) Arthritis -, Fibromyalgia -  Abdominal   Peds  Hematology   Anesthesia Other Findings   Reproductive/Obstetrics                             Anesthesia Physical Anesthesia Plan  ASA: II  Anesthesia Plan: General   Post-op Pain Management:    Induction: Intravenous  Airway Management Planned: LMA  Additional Equipment:   Intra-op Plan:   Post-operative Plan: Extubation in OR  Informed Consent: I have reviewed the patients History and Physical, chart, labs and discussed the procedure including the risks, benefits and alternatives for the proposed anesthesia with the patient or authorized representative who has indicated his/her understanding and acceptance.   Dental advisory given  Plan Discussed with: CRNA and Surgeon  Anesthesia Plan Comments:         Anesthesia Quick Evaluation

## 2014-07-25 ENCOUNTER — Encounter (HOSPITAL_BASED_OUTPATIENT_CLINIC_OR_DEPARTMENT_OTHER): Payer: Self-pay | Admitting: Orthopedic Surgery

## 2014-07-26 NOTE — Op Note (Signed)
Shelly [name], CUBBAGE NO.:  [id number]  MEDICAL RECORD NO.:  [id number]  LOCATION:                                FACILITY:  [facility]  PHYSICIAN:  Leanora Cover, MD        DATE OF BIRTH:  1960-09-11  DATE OF PROCEDURE: DATE OF DISCHARGE:  07/24/2014                              OPERATIVE REPORT   PREOPERATIVE DIAGNOSIS:  Left thumb carpometacarpal osteoarthritis.  POSTOPERATIVE DIAGNOSIS:  Left thumb carpometacarpal osteoarthritis.  PROCEDURE:  Left thumb carpometacarpal arthroplasty with Garcia-Elias technique.  SURGEON:  Leanora Cover, MD  ASSISTANT:  Daryll Brod, MD  ANESTHESIA:  General with regional.  IV FLUIDS:  Per Anesthesia flow sheet.  ESTIMATED BLOOD LOSS:  Minimal.  COMPLICATIONS:  None.  SPECIMENS:  None.  TOURNIQUET TIME:  69 minutes.  DISPOSITION:  Stable to PACU.  INDICATIONS:  [name] is a 53 year old female who has had pain at the base of the thumb for quite some time.  She has failed conservative treatment with splinting, anti-inflammatories and injections.  She wished to have a Hayward arthroplasty.  Risks, benefits, and alternatives of surgery were discussed including the risk of blood loss, infection, damage to nerves, vessels, tendons, ligaments, bone; failure of surgery; need for additional surgery, complications with wound healing, continued pain, need for further procedures.  She voiced understanding of these risks, and elected to proceed.  OPERATIVE COURSE:  After being identified preoperatively by myself, the patient and I agreed upon the procedure and site of procedure.  Surgical site was marked.  The risks, benefits, and alternatives of surgery were reviewed and she wished to proceed.  Surgical consent had been signed. She was given IV Ancef as preoperative antibiotic prophylaxis.  Regional block was performed by anesthesia in preoperative holding.  She was transferred to the operating room and placed on the operating room  table in supine position with left upper extremity on arm board.  General anesthesia was induced by Anesthesiologist.  Left upper extremity was prepped and draped in normal sterile orthopedic fashion.  Surgical pause was performed between surgeons, anesthesia and operating staff, and all were in agreement as to the patient, procedure, and site of procedure. Tourniquet at the proximal aspect of the extremity was inflated to 250 mmHg after exsanguination of the limb with an Esmarch bandage.  Incision was made at the glabrous hairy border of the skin and carried onto the volar aspect of the wrist.  This was carried into subcutaneous tissues by spreading technique.  Bipolar electrocautery was used to obtain hemostasis.  Interval between the APL tendon and the thenar musculature was formed.  The trapezium was identified.  It was confirmed under C- arm.  The capsule was incised.  The attachments to the trapezium were released.  The trapezium was removed in a piecemeal fashion.  The FCR tendon was preserved.  There was lack of cartilage at the [facility] joint and trapezium and there were osteophytes as well.  These were removed.  The radial side of the FCR tendon was harvested.  Two hash mark type incisions were made in the proximal forearm to harvest the tendon.  This was harvested at the ulnar  side proximally, which rotated around the radial side distally.  The remaining portion of the FCR tendon was approximately quarter the tendon and seemed somewhat thin, though adequate for continuation of the Garcia-Elias technique.  The FCR tendon was then weaved back and forth between the APL and FCR tendon remaining in the Garcia-Elias fashion.  This was secured with 3-0 FiberWire suture. This provided good bolster of tendon in the space vacated by the trapezium.  The C-arm was used in AP and lateral and oblique projections.  There was good suspension of the thumb.  It had been under axial pressure.  The  thumb metacarpal was suspended nicely.  The wound had been copiously irrigated with sterile saline.  The capsule was repaired back over top of the arthroplasty with a 3-0 Vicryl suture in a figure- of-eight fashion.  The skin incisions were closed with 4-0 nylon in a horizontal mattress fashion.  The wounds were dressed with sterile Xeroform, 4x4s, and wrapped with a Kerlix bandage.  A thumb spica splint was placed and wrapped with Kerlix and Ace bandage.  The tourniquet was deflated at 70 minutes.  Fingertips were pink with brisk capillary refill after deflation of tourniquet.  The operative drapes were broken down.  The patient was awoken from anesthesia safely.  She was transferred back to stretcher and taken to PACU in stable condition.  I will see her back in the office in 1 week for postoperative followup.  I will give her Norco 5/325, 1-2 p.o. q.6 hours p.r.n. pain, dispensed #40, and Dilaudid 2 mg p.o. p.r.n. breakthrough pain dispensed #10.     Leanora Cover, MD     KK/MEDQ  D:  07/24/2014  T:  07/24/2014  Job:  [id number]

## 2014-09-05 DIAGNOSIS — L4050 Arthropathic psoriasis, unspecified: Secondary | ICD-10-CM | POA: Insufficient documentation

## 2015-03-20 ENCOUNTER — Encounter (INDEPENDENT_AMBULATORY_CARE_PROVIDER_SITE_OTHER): Payer: Self-pay | Admitting: *Deleted

## 2015-06-17 ENCOUNTER — Ambulatory Visit (INDEPENDENT_AMBULATORY_CARE_PROVIDER_SITE_OTHER): Payer: BLUE CROSS/BLUE SHIELD | Admitting: Internal Medicine

## 2015-06-17 ENCOUNTER — Encounter (INDEPENDENT_AMBULATORY_CARE_PROVIDER_SITE_OTHER): Payer: Self-pay | Admitting: Internal Medicine

## 2015-06-17 VITALS — BP 128/80 | HR 72 | Temp 98.0°F | Wt 231.1 lb

## 2015-06-17 DIAGNOSIS — K2100 Gastro-esophageal reflux disease with esophagitis, without bleeding: Secondary | ICD-10-CM

## 2015-06-17 DIAGNOSIS — K2270 Barrett's esophagus without dysplasia: Secondary | ICD-10-CM | POA: Diagnosis not present

## 2015-06-17 NOTE — Patient Instructions (Signed)
OV in 1 year. Continue the Nexium.  

## 2015-06-17 NOTE — Progress Notes (Signed)
Subjective:    Patient ID: Shelly [name], female    DOB: 11/06/1960, 54 y.o.   MRN: [id number]  HPI  Here today for f/u of her chronic GERD. Hx of erosive reflux esophagitis with Schatzki' ring in 2013. She is covered with Nexium 40mg  daily.  Hx of short segment Barrett's. Diagnosed in 2012. No dysplasia. Appetite is good. No weight loss.  She occasionally has acid reflux. She is careful not to eat late. Occasionally has dysphagia. Dysphagia occurs once every 2 weeks. Usually has a BM daily. No melena or BRRB. Presently taking Remicade  550 mg IV for her psoriatic arthritis. Next dose in November. Had tried Humira and Cimzia but had a shingles outbreak.       04/15/2012:   EGD with ED.  Indications:  Patient is 54 year old Caucasian female who has chronic GERD complicated by short segment Barrett's esophagus who presents for solid food dysphagia. She was recently treated with doxycycline for Lyme disease but does not recall having had any problems swallowing this medication.                                                                                                   Impression: Erosive reflux esophagitis with Schatzki's ring. Short segment Barrett's esophagus. Small sliding hiatal hernia. Shots these ring disrupted by passing 25 [place] Maloney dilator and al    Review of Systems Past Medical History  Diagnosis Date  . Hypothyroidism   . GERD (gastroesophageal reflux disease)   . Acid reflux   . Focal nodular hyperplasia of liver surgery in 2004    partial hepactectomy  . Carpal tunnel syndrome   . Wears glasses   . PONV (postoperative nausea and vomiting)   . Adverse effect of general anesthetic     shivering recently-had to have demerol  . Arthritis     Past Surgical History  Procedure Laterality Date  . Partial hysterectomy    . Thyroidectomy, partial    . Carpal tunnel release      rt  . Tibia hardware removal  2011    rt ankle   . Esophagogastroduodenoscopy   03/05/2011    Procedure: ESOPHAGOGASTRODUODENOSCOPY (EGD);  Surgeon: Rogene Houston, MD;  [facility];  Service: Endoscopy;  Laterality: N/A;  . Colonoscopy  03/05/2011    Procedure: COLONOSCOPY;  Surgeon: Rogene Houston, MD;  [facility];  Service: Endoscopy;  Laterality: N/A;  . Cholecystectomy  2004    liver resection benign tumor with GB  . Fracture surgery  2011    rt ankle  . Carpal tunnel release Left 01/12/2013    Procedure: CARPAL TUNNEL RELEASE;  Surgeon: Cammie Sickle., MD;  Location: [place];  Service: Orthopedics;  Laterality: Left;  . Trigger finger release Left 01/12/2013    Procedure: RELEASE TRIGGER FINGER/A-1 PULLEY;  Surgeon: Cammie Sickle., MD;  Location: [place];  Service: Orthopedics;  Laterality: Left;  . Steriod injection Left 01/12/2013    Procedure: INJECTION LEFT THUMB [place] JOINT;  Surgeon: Youlanda Mighty Sypher  Brooke Bonito., MD;  Location: [place];  Service: Orthopedics;  Laterality: Left;  . Dorsal compartment release Left 01/12/2013    Procedure: RELEASE DORSAL COMPARTMENT (DEQUERVAIN);  Surgeon: Cammie Sickle., MD;  [facility];  Service: Orthopedics;  Laterality: Left;  . Carpometacarpel suspension plasty Left 07/24/2014    Procedure: LEFT THUMB CARPOMETACARPEL ([place]) ARTHROPLASTY GARCIA ELIAS ;  Surgeon: Leanora Cover, MD;  Location: [place];  Service: Orthopedics;  Laterality: Left;    Allergies  Allergen Reactions  . Ciprofloxacin     Patient states that it was neuro pain , could hardly walk,.  . Dairy Aid [Lactase]   . Eggs Or Egg-Derived Products   . Nsaids Other (See Comments)    Causes Gastric Problems   . Other     gluten  . Sulfa Antibiotics Hives  . Wheat Bran     Current Outpatient Prescriptions on File Prior to Visit  Medication Sig Dispense Refill  . esomeprazole (NEXIUM) 20 MG capsule Take 2 capsules (40 mg total) by mouth  daily before breakfast. 30 capsule 0  . HYDROcodone-acetaminophen (NORCO) 5-325 MG per tablet 1-2 tabs po q6 hours prn pain 40 tablet 0  . thyroid (ARMOUR) 60 MG tablet Take 120 mg by mouth daily.      No current facility-administered medications on file prior to visit.   .     Objective:   Physical Exam Blood pressure 128/80, pulse 72, temperature 98 F (36.7 C), weight 231 lb 1.6 oz (104.826 kg), peak flow 66 L/min. Alert and oriented. Skin warm and dry. Oral mucosa is moist.   . Sclera anicteric, conjunctivae is pink. Thyroid not enlarged. No cervical lymphadenopathy. Lungs clear. Heart regular rate and rhythm.  Abdomen is soft. Bowel sounds are positive. No hepatomegaly. No abdominal masses felt. No tenderness.  No edema to lower extremities.         Assessment & Plan:  GERD controlled at this time with Nexium.  Short segment Barrett's. Continue to Nexium. Needs surveillance EGD in 2018 with Propofol.

## 2016-01-07 DIAGNOSIS — M1220 Villonodular synovitis (pigmented), unspecified site: Secondary | ICD-10-CM | POA: Insufficient documentation

## 2016-02-25 ENCOUNTER — Ambulatory Visit: Payer: BLUE CROSS/BLUE SHIELD | Attending: Orthopedic Surgery | Admitting: Physical Therapy

## 2016-02-25 ENCOUNTER — Encounter: Payer: Self-pay | Admitting: Physical Therapy

## 2016-02-25 DIAGNOSIS — R600 Localized edema: Secondary | ICD-10-CM | POA: Insufficient documentation

## 2016-02-25 DIAGNOSIS — M25662 Stiffness of left knee, not elsewhere classified: Secondary | ICD-10-CM | POA: Diagnosis present

## 2016-02-25 DIAGNOSIS — M25661 Stiffness of right knee, not elsewhere classified: Secondary | ICD-10-CM | POA: Insufficient documentation

## 2016-02-25 DIAGNOSIS — M25561 Pain in right knee: Secondary | ICD-10-CM | POA: Insufficient documentation

## 2016-02-25 DIAGNOSIS — M25562 Pain in left knee: Secondary | ICD-10-CM | POA: Diagnosis present

## 2016-02-25 NOTE — Patient Instructions (Signed)
  Quad Set   With other leg bent, foot flat, slowly tighten muscles on thigh of straight leg while counting out loud to _10___. Repeat with other leg. Repeat _10-20__ times. Do _4-5___ sessions per day.   Hamstring Set  Gently with R leg. With one leg bent slightly, push heel into bed without bending knee further. Hold _5-10___ seconds. Alternate legs. Repeat _10-20___ times. Do _4-5__ sessions per day.   Bracing With Heel Slides (Supine)   With neutral spine, tighten pelvic floor and abdominals and hold. Alternating legs, slide heel to bottom. Repeat 10___ times. Do __4-5_ times a day.    Straight Leg Raise   With other leg bent, foot flat, slowly tighten muscles on thigh of straight leg and raise leg 10-12 inches. Repeat with other leg. Repeat _10-20__ times. Do _2__ sessions per day.    Madelyn Flavors, PT 02/25/2016 9:04 AM; [facility] [place], [place], [address] Phone: [phone]   Fax:  [phone]

## 2016-02-25 NOTE — Therapy (Signed)
[facility] [place], [place], [address] Phone: [phone]   Fax:  [phone]  Physical Therapy Evaluation  Patient Details  Name: Shelly [name] MRN: [id number] Date of Birth: [birth year] Referring Provider: Georgianne Fick, MD  Encounter Date: 02/25/2016      PT End of Session - 02/25/16 0821    Visit Number 1   Number of Visits 12   Date for PT Re-Evaluation 04/07/16   PT Start Time 0821   PT Stop Time 0914   PT Time Calculation (min) 53 min   Activity Tolerance Patient tolerated treatment well;Patient limited by pain   Behavior During Therapy [facility] for tasks assessed/performed      Past Medical History  Diagnosis Date  . Hypothyroidism   . GERD (gastroesophageal reflux disease)   . Acid reflux   . Focal nodular hyperplasia of liver surgery in 2004    partial hepactectomy  . Carpal tunnel syndrome   . Wears glasses   . PONV (postoperative nausea and vomiting)   . Adverse effect of general anesthetic     shivering recently-had to have demerol  . Arthritis     Past Surgical History  Procedure Laterality Date  . Partial hysterectomy    . Thyroidectomy, partial    . Carpal tunnel release      rt  . Tibia hardware removal  2011    rt ankle   . Esophagogastroduodenoscopy  03/05/2011    Procedure: ESOPHAGOGASTRODUODENOSCOPY (EGD);  Surgeon: Rogene Houston, MD;  [facility];  Service: Endoscopy;  Laterality: N/A;  . Colonoscopy  03/05/2011    Procedure: COLONOSCOPY;  Surgeon: Rogene Houston, MD;  [facility];  Service: Endoscopy;  Laterality: N/A;  . Cholecystectomy  2004    liver resection benign tumor with GB  . Fracture surgery  2011    rt ankle  . Carpal tunnel release Left 01/12/2013    Procedure: CARPAL TUNNEL RELEASE;  Surgeon: Cammie Sickle., MD;  Location: [place];  Service: Orthopedics;  Laterality: Left;  . Trigger finger release Left 01/12/2013     Procedure: RELEASE TRIGGER FINGER/A-1 PULLEY;  Surgeon: Cammie Sickle., MD;  Location: [place];  Service: Orthopedics;  Laterality: Left;  . Steriod injection Left 01/12/2013    Procedure: INJECTION LEFT THUMB [place] JOINT;  Surgeon: Cammie Sickle., MD;  Location: [place];  Service: Orthopedics;  Laterality: Left;  . Dorsal compartment release Left 01/12/2013    Procedure: RELEASE DORSAL COMPARTMENT (DEQUERVAIN);  Surgeon: Cammie Sickle., MD;  Location[facility];  Service: Orthopedics;  Laterality: Left;  . Carpometacarpel suspension plasty Left 07/24/2014    Procedure: LEFT THUMB CARPOMETACARPEL ([place]) ARTHROPLASTY GARCIA ELIAS ;  Surgeon: Leanora Cover, MD;  Location: [place];  Service: Orthopedics;  Laterality: Left;    There were no vitals filed for this visit.       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 She also reports a fall on 01/31/16 injuring her R knee. Xrays were negative. She returns to MD on the 03/12/16 and will have an MRI if the R knee is no better. The left knee feels like it wants to bend backwards. The right knee feels like it wants to give way.   Pertinent History Psoriatic arthritis, Arthroscopy 11/15, Ankle surgery 6/11 and 3/12.  Limitations Sitting;Walking   How long can you sit comfortably? 20-30 min (R knee)   How long can you walk comfortably? 30 min    Diagnostic tests xray neg   Patient Stated Goals to be able to walk without crutches   Currently in Pain? Yes   Pain Score 6    Pain Location Knee   Pain Orientation Right;Left;Medial   Pain Descriptors / Indicators Burning;Sharp  L healing ache   Pain Type Acute pain;Surgical pain   Pain Onset 1 to 4 weeks ago   Pain Frequency Intermittent   Aggravating Factors  sitting and walking, touch, wakes her up at night   Pain Relieving  Factors ice and rest, meds   Effect of Pain on Daily Activities limited with ADLS            [facility] PT Assessment - 02/25/16 0001    Assessment   Medical Diagnosis L knee PVNS resection; Fall with R MCL   Referring Provider Georgianne Fick, MD   Onset Date/Surgical Date 01/30/16   Next MD Visit 03/12/16   Precautions   Precautions Knee   Precaution Comments WBAT Bil   Restrictions   Weight Bearing Restrictions No   Balance Screen   Has the patient fallen in the past 6 months Yes   How many times? 1   Has the patient had a decrease in activity level because of a fear of falling?  Yes   Is the patient reluctant to leave their home because of a fear of falling?  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   ROM / Strength   AROM / PROM / Strength AROM;Strength   AROM   AROM Assessment Site Knee   Right/Left Knee Right;Left   Right Knee Extension -8  unable to tolerate PROM   Right Knee Flexion 87  98 deg PROM   Left Knee Extension 0   Left Knee Flexion 90  105 passive   Strength   Strength Assessment Site Hip;Knee   Right/Left Hip Right;Left   Right Hip Flexion 5/5   Right Hip Extension 4/5   Right Hip ABduction 4/5   Left Hip Flexion 5/5   Left Hip Extension 4+/5   Left Hip ABduction 4-/5   Right/Left Knee Right;Left   Right Knee Flexion 4+/5   Right Knee Extension 5/5   Left Knee Flexion 4+/5   Left Knee Extension 5/5   Flexibility   Soft Tissue Assessment /Muscle Length --  Positive Ober L; unable to test R   Palpation   Palpation comment B knees tender along posterior joint, R  distal ITB and med joint line as well as superior gastroc   Special Tests    Special Tests Knee Special Tests   Knee Special tests  other  pain with IR of hip NWB on R   other    Findings Positive   Side  Right   Comments R valgus stress                   [facility] Adult PT Treatment/Exercise - 02/25/16 0001  Modalities   Modalities Electrical Stimulation;Cryotherapy   Cryotherapy   Number Minutes Cryotherapy 15 Minutes   Cryotherapy Location Knee  Bil   Type of Cryotherapy Ice pack   Electrical Stimulation   Electrical Stimulation Location B knees; Ch 1 R knee 80-150 Hz for pain; L knee 1-10 Hz for edema x 15 min to tolerance   Electrical Stimulation Action premod    Electrical Stimulation Goals Pain;Edema                PT Education - 02/25/16 1449    Education provided Yes   Education Details continue HEP   Person(s) Educated Patient   Methods Explanation;Demonstration;Handout   Comprehension Verbalized understanding;Returned demonstration             PT Long Term Goals - 02/25/16 1455    PT LONG TERM GOAL #1   Title I with HEP   Time 6   Period Weeks   Status New   PT LONG TERM GOAL #2   Title decreased B knee pain to 2/10 to improve gait and function   Time 6   Period Weeks   Status New   PT LONG TERM GOAL #3   Title improved B knee ROM 0-125 Bil to improve function   Time 6   Period Weeks   Status New   PT LONG TERM GOAL #4   Title demo 5/5 B hip/knee strength   Time 6   Period Weeks   Status New   PT LONG TERM GOAL #5   Title Patient able to amb without AD safely 350 feet.   Time 6   Period Weeks   Status New               Plan - 02/25/16 1449    Clinical Impression Statement Patient presents s/p L knee synovectomy for PVNS (pigmented, villonodular synoviits) on 01/30/16. She also reports a fall on 01/31/16 injuring her R knee. Patient has limited ROM, pain and weakness B. She must amb with bil loftstrand crutches.     Rehab Potential Good   PT Frequency 2x / week   PT Duration 6 weeks   PT Treatment/Interventions ADLs/Self Care Home Management;Cryotherapy;Electrical Stimulation;Iontophoresis 4mg /ml Dexamethasone;Ultrasound;Gait training;Therapeutic exercise;Manual techniques;Patient/family education;Neuromuscular re-education;Balance training;Passive range of motion;Dry needling;Vasopneumatic Device   PT Next Visit Plan gentle B knee/hip strengthening and ROM; modalities for pain and inflammation including ionto to R MCL; manual therapy prn.   PT Home Exercise Plan quad and HS sets, heel slides, slr   Consulted and Agree with Plan of Care Patient      Patient will benefit from skilled therapeutic intervention in order to improve the following deficits and impairments:  Abnormal gait, Decreased range of motion, Difficulty walking, Pain, Decreased strength, Increased edema, Impaired flexibility  Visit Diagnosis: Pain in right knee - Plan: PT plan of care cert/re-cert  Pain in left knee - Plan: PT plan of care cert/re-cert  Stiffness of right knee, not elsewhere classified - Plan: PT plan of care cert/re-cert  Stiffness of left knee, not elsewhere classified - Plan: PT plan of care cert/re-cert  Localized edema - Plan: PT plan of care cert/re-cert     Problem List Patient Active Problem List   Diagnosis Date Noted  . Dysphagia 04/07/2012  . Fibromyalgia 04/07/2012  . GERD (gastroesophageal reflux disease) 04/07/2012  . Hypothyroid 04/07/2012  . Shingles 04/07/2012    Madelyn Flavors PT  02/25/2016, 3:13 PM  [facility] [place]  [place], [place], [address] Phone: [phone]   Fax:  [phone]  Name: Shelly [name] MRN: [id number] Date of Birth: [birth year]

## 2016-02-26 ENCOUNTER — Encounter (INDEPENDENT_AMBULATORY_CARE_PROVIDER_SITE_OTHER): Payer: Self-pay | Admitting: *Deleted

## 2016-02-27 ENCOUNTER — Encounter: Payer: Self-pay | Admitting: Physical Therapy

## 2016-02-27 ENCOUNTER — Ambulatory Visit: Payer: BLUE CROSS/BLUE SHIELD | Admitting: Physical Therapy

## 2016-02-27 DIAGNOSIS — R600 Localized edema: Secondary | ICD-10-CM

## 2016-02-27 DIAGNOSIS — M25561 Pain in right knee: Secondary | ICD-10-CM | POA: Diagnosis not present

## 2016-02-27 DIAGNOSIS — M25662 Stiffness of left knee, not elsewhere classified: Secondary | ICD-10-CM

## 2016-02-27 DIAGNOSIS — M25562 Pain in left knee: Secondary | ICD-10-CM

## 2016-02-27 DIAGNOSIS — M25661 Stiffness of right knee, not elsewhere classified: Secondary | ICD-10-CM

## 2016-02-27 NOTE — Therapy (Signed)
[facility] [place], [place], [address] Phone: [phone]   Fax:  [phone]  Physical Therapy Treatment  Patient Details  Name: Shelly [name] MRN: [id number] Date of Birth: 08/06/1961 Referring Provider: Georgianne Fick, MD  Encounter Date: 02/27/2016      PT End of Session - 02/27/16 0907    Visit Number 2   Number of Visits 12   Date for PT Re-Evaluation 04/07/16   PT Start Time 0905   PT Stop Time 0945   PT Time Calculation (min) 40 min   Activity Tolerance Patient tolerated treatment well;Patient limited by pain   Behavior During Therapy [facility] for tasks assessed/performed      Past Medical History  Diagnosis Date  . Hypothyroidism   . GERD (gastroesophageal reflux disease)   . Acid reflux   . Focal nodular hyperplasia of liver surgery in 2004    partial hepactectomy  . Carpal tunnel syndrome   . Wears glasses   . PONV (postoperative nausea and vomiting)   . Adverse effect of general anesthetic     shivering recently-had to have demerol  . Arthritis     Past Surgical History  Procedure Laterality Date  . Partial hysterectomy    . Thyroidectomy, partial    . Carpal tunnel release      rt  . Tibia hardware removal  2011    rt ankle   . Esophagogastroduodenoscopy  03/05/2011    Procedure: ESOPHAGOGASTRODUODENOSCOPY (EGD);  Surgeon: Rogene Houston, MD;  [facility];  Service: Endoscopy;  Laterality: N/A;  . Colonoscopy  03/05/2011    Procedure: COLONOSCOPY;  Surgeon: Rogene Houston, MD;  [facility];  Service: Endoscopy;  Laterality: N/A;  . Cholecystectomy  2004    liver resection benign tumor with GB  . Fracture surgery  2011    rt ankle  . Carpal tunnel release Left 01/12/2013    Procedure: CARPAL TUNNEL RELEASE;  Surgeon: Cammie Sickle., MD;  Location: [place];  Service: Orthopedics;  Laterality: Left;  . Trigger finger release Left 01/12/2013     Procedure: RELEASE TRIGGER FINGER/A-1 PULLEY;  Surgeon: Cammie Sickle., MD;  Location: [place];  Service: Orthopedics;  Laterality: Left;  . Steriod injection Left 01/12/2013    Procedure: INJECTION LEFT THUMB [place] JOINT;  Surgeon: Cammie Sickle., MD;  Location: [place];  Service: Orthopedics;  Laterality: Left;  . Dorsal compartment release Left 01/12/2013    Procedure: RELEASE DORSAL COMPARTMENT (DEQUERVAIN);  Surgeon: Cammie Sickle., MD;  Location: [facility];  Service: Orthopedics;  Laterality: Left;  . Carpometacarpel suspension plasty Left 07/24/2014    Procedure: LEFT THUMB CARPOMETACARPEL ([place]) ARTHROPLASTY GARCIA ELIAS ;  Surgeon: Leanora Cover, MD;  Location: [place];  Service: Orthopedics;  Laterality: Left;    There were no vitals filed for this visit.      Subjective Assessment - 02/27/16 0907    Subjective Reports that she has been compliant with HEP. Reports that she is still having more pain with R knee.   Pertinent History Psoriatic arthritis, Arthroscopy 11/15, Ankle surgery 6/11 and 3/12.   Limitations Sitting;Walking   How long can you sit comfortably? 20-30 min (R knee)   How long can you walk comfortably? 30 min    Diagnostic tests xray neg   Patient Stated Goals to be able to walk without crutches  Currently in Pain? Yes   Pain Score 5    Pain Location Knee   Pain Orientation Right   Pain Descriptors / Indicators Discomfort   Pain Type Acute pain   Pain Onset 1 to 4 weeks ago            [facility] PT Assessment - 02/27/16 0001    Assessment   Medical Diagnosis L knee PVNS resection; Fall with R MCL   Onset Date/Surgical Date 01/30/16   Next MD Visit 03/12/16   Precautions   Precautions Knee   Precaution Comments WBAT Bil   Restrictions   Weight Bearing Restrictions No                     [facility] Adult PT Treatment/Exercise - 02/27/16 0001    Exercises   Exercises  Knee/Hip   Knee/Hip Exercises: Supine   Quad Sets AROM;Both;2 sets;10 reps   Quad Sets Limitations B HS set 3 sec hold x20 reps   Short Arc Quad Sets Other (comment)  Attempted Bilaterally but d/c due to B knee pain   Heel Slides AROM  LLE x8 reps, RLE x2 reps but both very painful   Modalities   Modalities Electrical Stimulation;Vasopneumatic;Cryotherapy   Cryotherapy   Number Minutes Cryotherapy 15 Minutes   Cryotherapy Location Knee  LLE   Type of Cryotherapy Ice pack   Electrical Stimulation   Electrical Stimulation Location B knee   Electrical Stimulation Action Pre-Mod   Electrical Stimulation Parameters 80-150 hz x15 min   Electrical Stimulation Goals Pain;Edema   Vasopneumatic   Number Minutes Vasopneumatic  15 minutes   Vasopnuematic Location  Knee  R knee   Vasopneumatic Pressure Medium   Vasopneumatic Temperature  34                     PT Long Term Goals - 02/25/16 1455    PT LONG TERM GOAL #1   Title I with HEP   Time 6   Period Weeks   Status New   PT LONG TERM GOAL #2   Title decreased B knee pain to 2/10 to improve gait and function   Time 6   Period Weeks   Status New   PT LONG TERM GOAL #3   Title improved B knee ROM 0-125 Bil to improve function   Time 6   Period Weeks   Status New   PT LONG TERM GOAL #4   Title demo 5/5 B hip/knee strength   Time 6   Period Weeks   Status New   PT LONG TERM GOAL #5   Title Patient able to amb without AD safely 350 feet.   Time 6   Period Weeks   Status New               Plan - 02/27/16 0929    Clinical Impression Statement Patient arrived to treatment with B knee donned with braces secondary to L knee surgery and R knee injury secondary to fall. Patient is compliant with HEP per patient report at this time. Patient completed or attempted exercises today although she would experience increased pain in B knee wiht moveemnt for exercises. Exercises were stopped secondary to the pain  experienced. Stimulaiton conducted to B knee in hooklying with BLE elevated. Paitent requested vasopenumatic device donned to R knee secondary to more pain with R knee and ice pack to L knee. Patient continues to be limited with ROM and due to  pain. Normal modalitiies response noted following removal of the modalities.   Rehab Potential Good   PT Frequency 2x / week   PT Duration 6 weeks   PT Treatment/Interventions ADLs/Self Care Home Management;Cryotherapy;Electrical Stimulation;Iontophoresis 4mg /ml Dexamethasone;Ultrasound;Gait training;Therapeutic exercise;Manual techniques;Patient/family education;Neuromuscular re-education;Balance training;Passive range of motion;Dry needling;Vasopneumatic Device   PT Next Visit Plan gentle B knee/hip strengthening and ROM; modalities for pain and inflammation including ionto to R MCL; manual therapy prn.   PT Home Exercise Plan quad and HS sets, heel slides, slr   Consulted and Agree with Plan of Care Patient      Patient will benefit from skilled therapeutic intervention in order to improve the following deficits and impairments:  Abnormal gait, Decreased range of motion, Difficulty walking, Pain, Decreased strength, Increased edema, Impaired flexibility  Visit Diagnosis: Pain in right knee  Pain in left knee  Stiffness of right knee, not elsewhere classified  Stiffness of left knee, not elsewhere classified  Localized edema     Problem List Patient Active Problem List   Diagnosis Date Noted  . Dysphagia 04/07/2012  . Fibromyalgia 04/07/2012  . GERD (gastroesophageal reflux disease) 04/07/2012  . Hypothyroid 04/07/2012  . Shingles 04/07/2012    Wynelle Fanny, PTA 02/27/2016, 10:01 AM  [facility] [address] [place], [place], [address] Phone: [phone]   Fax:  [phone]  Name: Shelly [name] MRN: [id number] Date of Birth: 1960-10-30

## 2016-03-04 ENCOUNTER — Ambulatory Visit: Payer: BLUE CROSS/BLUE SHIELD | Admitting: Physical Therapy

## 2016-03-04 ENCOUNTER — Encounter: Payer: Self-pay | Admitting: Physical Therapy

## 2016-03-04 DIAGNOSIS — M25662 Stiffness of left knee, not elsewhere classified: Secondary | ICD-10-CM

## 2016-03-04 DIAGNOSIS — M25661 Stiffness of right knee, not elsewhere classified: Secondary | ICD-10-CM

## 2016-03-04 DIAGNOSIS — M25561 Pain in right knee: Secondary | ICD-10-CM | POA: Diagnosis not present

## 2016-03-04 DIAGNOSIS — R600 Localized edema: Secondary | ICD-10-CM

## 2016-03-04 DIAGNOSIS — M25562 Pain in left knee: Secondary | ICD-10-CM

## 2016-03-04 NOTE — Therapy (Signed)
[facility] [place], [place], [address] Phone: [phone]   Fax:  [phone]  Physical Therapy Treatment  Patient Details  Name: Shelly [name] MRN: [id number] Date of Birth: 1960-12-02 Referring Provider: Georgianne Fick, MD  Encounter Date: 03/04/2016      PT End of Session - 03/04/16 1209    Visit Number 3   Number of Visits 12   Date for PT Re-Evaluation 04/07/16   PT Start Time 1036   PT Stop Time 1125   PT Time Calculation (min) 49 min   Activity Tolerance Patient tolerated treatment well;Patient limited by pain   Behavior During Therapy [facility] for tasks assessed/performed      Past Medical History  Diagnosis Date  . Hypothyroidism   . GERD (gastroesophageal reflux disease)   . Acid reflux   . Focal nodular hyperplasia of liver surgery in 2004    partial hepactectomy  . Carpal tunnel syndrome   . Wears glasses   . PONV (postoperative nausea and vomiting)   . Adverse effect of general anesthetic     shivering recently-had to have demerol  . Arthritis     Past Surgical History  Procedure Laterality Date  . Partial hysterectomy    . Thyroidectomy, partial    . Carpal tunnel release      rt  . Tibia hardware removal  2011    rt ankle   . Esophagogastroduodenoscopy  03/05/2011    Procedure: ESOPHAGOGASTRODUODENOSCOPY (EGD);  Surgeon: Rogene Houston, MD;  [facility];  Service: Endoscopy;  Laterality: N/A;  . Colonoscopy  03/05/2011    Procedure: COLONOSCOPY;  Surgeon: Rogene Houston, MD;  [facility];  Service: Endoscopy;  Laterality: N/A;  . Cholecystectomy  2004    liver resection benign tumor with GB  . Fracture surgery  2011    rt ankle  . Carpal tunnel release Left 01/12/2013    Procedure: CARPAL TUNNEL RELEASE;  Surgeon: Cammie Sickle., MD;  Location: [place];  Service: Orthopedics;  Laterality: Left;  . Trigger finger release Left 01/12/2013   Procedure: RELEASE TRIGGER FINGER/A-1 PULLEY;  Surgeon: Cammie Sickle., MD;  Location: [place];  Service: Orthopedics;  Laterality: Left;  . Steriod injection Left 01/12/2013    Procedure: INJECTION LEFT THUMB [place] JOINT;  Surgeon: Cammie Sickle., MD;  Location: [place];  Service: Orthopedics;  Laterality: Left;  . Dorsal compartment release Left 01/12/2013    Procedure: RELEASE DORSAL COMPARTMENT (DEQUERVAIN);  Surgeon: Cammie Sickle., MD;  Location: [facility];  Service: Orthopedics;  Laterality: Left;  . Carpometacarpel suspension plasty Left 07/24/2014    Procedure: LEFT THUMB CARPOMETACARPEL ([place]) ARTHROPLASTY GARCIA ELIAS ;  Surgeon: Leanora Cover, MD;  Location: [place];  Service: Orthopedics;  Laterality: Left;    There were no vitals filed for this visit.      Subjective Assessment - 03/04/16 1034    Subjective Reports that the R knee is much worse today than L knee. Reports that she cried yesterday due to pain and agitation because of limitations.   Pertinent History Psoriatic arthritis, Arthroscopy 11/15, Ankle surgery 6/11 and 3/12.   Limitations Sitting;Walking   How long can you sit comfortably? 20-30 min (R knee)   How long can you walk comfortably? 30 min    Diagnostic tests xray neg   Patient Stated Goals to be able to walk  without crutches   Currently in Pain? Yes   Pain Score 7    Pain Location Knee   Pain Orientation Right   Pain Descriptors / Indicators Sharp;Stabbing;Throbbing   Pain Type Acute pain   Pain Onset 1 to 4 weeks ago   Multiple Pain Sites Yes   Pain Score 2   Pain Location Knee   Pain Orientation Left   Pain Type Surgical pain   Pain Frequency Intermittent            [facility] PT Assessment - 03/04/16 0001    Assessment   Medical Diagnosis L knee PVNS resection; Fall with R MCL   Onset Date/Surgical Date 01/30/16   Next MD Visit 03/12/16   Precautions   Precautions  Knee   Precaution Comments WBAT Bil   Restrictions   Weight Bearing Restrictions No                     [facility] Adult PT Treatment/Exercise - 03/04/16 0001    Modalities   Modalities Ultrasound;Electrical Stimulation;Vasopneumatic;Cryotherapy   Cryotherapy   Number Minutes Cryotherapy 15 Minutes   Cryotherapy Location Knee  R knee   Type of Cryotherapy Ice pack   Electrical Stimulation   Electrical Stimulation Location B knee   Electrical Stimulation Action Pre-Mod   Electrical Stimulation Parameters 80-150 hz x15 min   Electrical Stimulation Goals Pain;Edema   Ultrasound   Ultrasound Location R medial knee   Ultrasound Parameters 1.2 w/cm2, 50%, 3.3 mhz x10 min   Ultrasound Goals Pain   Vasopneumatic   Number Minutes Vasopneumatic  15 minutes   Vasopnuematic Location  Knee  L knee   Vasopneumatic Pressure Medium   Vasopneumatic Temperature  34   Manual Therapy   Manual Therapy Myofascial release   Myofascial Release Gentle IASTW to R medial knee to decrease adhesions and promote proper fiber alignment in MCL region                     PT Long Term Goals - 02/25/16 1455    PT LONG TERM GOAL #1   Title I with HEP   Time 6   Period Weeks   Status New   PT LONG TERM GOAL #2   Title decreased B knee pain to 2/10 to improve gait and function   Time 6   Period Weeks   Status New   PT LONG TERM GOAL #3   Title improved B knee ROM 0-125 Bil to improve function   Time 6   Period Weeks   Status New   PT LONG TERM GOAL #4   Title demo 5/5 B hip/knee strength   Time 6   Period Weeks   Status New   PT LONG TERM GOAL #5   Title Patient able to amb without AD safely 350 feet.   Time 6   Period Weeks   Status New               Plan - 03/04/16 1210    Clinical Impression Statement Patient arrived to treatment with continued B knee pain although R> L knee. Patient remains compliant with use of Loftstrand crutches, L knee compression  garment, and R knee brace. Patient remains very limited with activity secondary to sudden sharp pains especially with R knee. Normal [place] response noted to R medial knee to promote increased bloodflow and healing. IASTW completed gently to R medial knee to promote proper fiber alignment and to promote  proper healing. Normal stimulation/ice pack/ vasopneumatic response following end of treatment to control pain and edema.    Rehab Potential Good   PT Frequency 2x / week   PT Duration 6 weeks   PT Treatment/Interventions ADLs/Self Care Home Management;Cryotherapy;Electrical Stimulation;Iontophoresis 4mg /ml Dexamethasone;Ultrasound;Gait training;Therapeutic exercise;Manual techniques;Patient/family education;Neuromuscular re-education;Balance training;Passive range of motion;Dry needling;Vasopneumatic Device   PT Next Visit Plan gentle B knee/hip strengthening and ROM; modalities for pain and inflammation including ionto to R MCL; manual therapy prn.   PT Home Exercise Plan quad and HS sets, heel slides, slr   Consulted and Agree with Plan of Care Patient      Patient will benefit from skilled therapeutic intervention in order to improve the following deficits and impairments:  Abnormal gait, Decreased range of motion, Difficulty walking, Pain, Decreased strength, Increased edema, Impaired flexibility  Visit Diagnosis: Pain in right knee  Pain in left knee  Stiffness of right knee, not elsewhere classified  Stiffness of left knee, not elsewhere classified  Localized edema     Problem List Patient Active Problem List   Diagnosis Date Noted  . Dysphagia 04/07/2012  . Fibromyalgia 04/07/2012  . GERD (gastroesophageal reflux disease) 04/07/2012  . Hypothyroid 04/07/2012  . Shingles 04/07/2012    Wynelle Fanny, PTA 03/04/2016, 12:16 PM  [facility] [address] [place], [place], [address] Phone: [phone]   Fax:  [phone]  Name:  Shelly [name] MRN: [id number] Date of Birth: [birth year]

## 2016-03-10 ENCOUNTER — Ambulatory Visit: Payer: BLUE CROSS/BLUE SHIELD | Admitting: Physical Therapy

## 2016-03-10 ENCOUNTER — Encounter: Payer: Self-pay | Admitting: Physical Therapy

## 2016-03-10 DIAGNOSIS — M25662 Stiffness of left knee, not elsewhere classified: Secondary | ICD-10-CM

## 2016-03-10 DIAGNOSIS — M25561 Pain in right knee: Secondary | ICD-10-CM | POA: Diagnosis not present

## 2016-03-10 DIAGNOSIS — R600 Localized edema: Secondary | ICD-10-CM

## 2016-03-10 DIAGNOSIS — M25661 Stiffness of right knee, not elsewhere classified: Secondary | ICD-10-CM

## 2016-03-10 DIAGNOSIS — M25562 Pain in left knee: Secondary | ICD-10-CM

## 2016-03-10 NOTE — Therapy (Signed)
[facility] [place], [place], [address] Phone: [phone]   Fax:  [phone]  Physical Therapy Treatment  Patient Details  Name: Shelly [name] MRN: [id number] Date of Birth: 1960/12/07 Referring Provider: Georgianne Fick, MD  Encounter Date: 03/10/2016      PT End of Session - 03/10/16 1302    Visit Number 4   Number of Visits 12   Date for PT Re-Evaluation 04/07/16   PT Start Time 1302   PT Stop Time 1340   PT Time Calculation (min) 38 min   Activity Tolerance Patient tolerated treatment well;Patient limited by pain   Behavior During Therapy [facility] for tasks assessed/performed      Past Medical History:  Diagnosis Date  . Acid reflux   . Adverse effect of general anesthetic    shivering recently-had to have demerol  . Arthritis   . Carpal tunnel syndrome   . Focal nodular hyperplasia of liver surgery in 2004   partial hepactectomy  . GERD (gastroesophageal reflux disease)   . Hypothyroidism   . PONV (postoperative nausea and vomiting)   . Wears glasses     Past Surgical History:  Procedure Laterality Date  . CARPAL TUNNEL RELEASE     rt  . CARPAL TUNNEL RELEASE Left 01/12/2013   Procedure: CARPAL TUNNEL RELEASE;  Surgeon: Cammie Sickle., MD;  Location: [place];  Service: Orthopedics;  Laterality: Left;  . CARPOMETACARPEL SUSPENSION PLASTY Left 07/24/2014   Procedure: LEFT THUMB CARPOMETACARPEL ([place]) ARTHROPLASTY GARCIA ELIAS ;  Surgeon: Leanora Cover, MD;  Location: [place];  Service: Orthopedics;  Laterality: Left;  . CHOLECYSTECTOMY  2004   liver resection benign tumor with GB  . COLONOSCOPY  03/05/2011   Procedure: COLONOSCOPY;  Surgeon: Rogene Houston, MD;  [facility];  Service: Endoscopy;  Laterality: N/A;  . DORSAL COMPARTMENT RELEASE Left 01/12/2013   Procedure: RELEASE DORSAL COMPARTMENT (DEQUERVAIN);  Surgeon: Cammie Sickle., MD;  Location:  [facility];  Service: Orthopedics;  Laterality: Left;  . ESOPHAGOGASTRODUODENOSCOPY  03/05/2011   Procedure: ESOPHAGOGASTRODUODENOSCOPY (EGD);  Surgeon: Rogene Houston, MD;  [facility];  Service: Endoscopy;  Laterality: N/A;  . FRACTURE SURGERY  2011   rt ankle  . PARTIAL HYSTERECTOMY    . STERIOD INJECTION Left 01/12/2013   Procedure: INJECTION LEFT THUMB [place] JOINT;  Surgeon: Cammie Sickle., MD;  Location: [place];  Service: Orthopedics;  Laterality: Left;  . THYROIDECTOMY, PARTIAL    . TIBIA HARDWARE REMOVAL  2011   rt ankle   . TRIGGER FINGER RELEASE Left 01/12/2013   Procedure: RELEASE TRIGGER FINGER/A-1 PULLEY;  Surgeon: Cammie Sickle., MD;  Location: [place];  Service: Orthopedics;  Laterality: Left;    There were no vitals filed for this visit.      Subjective Assessment - 03/10/16 1301    Subjective Reports that her R knee seems like it is getting worse. Reports that if her husband had been able to bring her she would have taken pain medicaiton. Reports that she has almost fallen twice due to R knee pain and giving away. Reports inability to stand from sitting position at times due to R knee.   Pertinent History Psoriatic arthritis, Arthroscopy 11/15, Ankle surgery 6/11 and 3/12.   Limitations Sitting;Walking   How long can you sit comfortably? 20-30 min (R knee)   How long can you walk  comfortably? 30 min    Diagnostic tests xray neg   Patient Stated Goals to be able to walk without crutches   Currently in Pain? Yes   Pain Score 9    Pain Location Knee   Pain Orientation Right   Pain Descriptors / Indicators Other (Comment)  "pulling, tearing."   Pain Type Acute pain   Pain Radiating Towards down to R toes   Pain Onset 1 to 4 weeks ago   Pain Frequency Constant   Pain Score 4   Pain Location Knee   Pain Orientation Left   Pain Descriptors / Indicators Tightness   Pain Type Surgical pain   Pain  Frequency Intermittent            [facility] PT Assessment - 03/10/16 0001      Assessment   Medical Diagnosis L knee PVNS resection; Fall with R MCL   Onset Date/Surgical Date 01/30/16   Next MD Visit 03/12/16     Precautions   Precautions Knee   Precaution Comments WBAT Bil     Restrictions   Weight Bearing Restrictions No                     [facility] Adult PT Treatment/Exercise - 03/10/16 0001      Modalities   Modalities Ultrasound;Electrical Stimulation;Vasopneumatic;Cryotherapy     Cryotherapy   Number Minutes Cryotherapy 15 Minutes   Cryotherapy Location Knee  L knee   Type of Cryotherapy Ice pack     Electrical Stimulation   Electrical Stimulation Location B knee   Electrical Stimulation Action Pre-Mod   Electrical Stimulation Parameters 80-150 hz x15 min   Electrical Stimulation Goals Pain;Edema     Ultrasound   Ultrasound Location R medial knee   Ultrasound Parameters 1.2 w/cm2, 10)%, 3.3 mh x10 min   Ultrasound Goals Pain     Vasopneumatic   Number Minutes Vasopneumatic  15 minutes   Vasopnuematic Location  Knee  R knee   Vasopneumatic Pressure Medium   Vasopneumatic Temperature  34                     PT Long Term Goals - 03/10/16 1330      PT LONG TERM GOAL #1   Title I with HEP   Time 6   Period Weeks   Status Partially Met  Completes as she can secondary to pain especially in R knee per patient report 03/10/2016     PT LONG TERM GOAL #2   Title decreased B knee pain to 2/10 to improve gait and function   Time 6   Period Weeks   Status On-going     PT LONG TERM GOAL #3   Title improved B knee ROM 0-125 Bil to improve function   Time 6   Period Weeks   Status On-going     PT LONG TERM GOAL #4   Title demo 5/5 B hip/knee strength   Time 6   Period Weeks   Status On-going     PT LONG TERM GOAL #5   Title Patient able to amb without AD safely 350 feet.   Time 6   Period Weeks   Status On-going  Currently  continuing to use B Loftstrand crutches 03/10/2016               Plan - 03/10/16 1328    Clinical Impression Statement Patient arrived to treatment with B [place]strand crutches and R knee brace and  no compression sleeve over L knee per MD orders according to patient. Patient continues to report high R knee pain and intense tenderness over R medial knee. Patient presented with increased mediosuperior R knee inflammation. Patient reports compliance with HEP given previously although she tries to tolerate exercises as she can secondary to intense pain. Patient experiences intermittant R knee catching even at rest. Currently unable to complete goal assessment secondary to pain especially with R knee. [place] completed over R medial knee to promote healing with increased bloodflow to affected tissue. Normal modalities response noted followng removal of the modalities. IASTW not completed to R MCL region today secondary to the intense tenderness that patient reports today.    Rehab Potential Good   PT Frequency 2x / week   PT Duration 6 weeks   PT Treatment/Interventions ADLs/Self Care Home Management;Cryotherapy;Electrical Stimulation;Iontophoresis 8m/ml Dexamethasone;Ultrasound;Gait training;Therapeutic exercise;Manual techniques;Patient/family education;Neuromuscular re-education;Balance training;Passive range of motion;Dry needling;Vasopneumatic Device   PT Next Visit Plan Continue with B knee/hip strengthening and ROM with modalities PRN for pain/edema as symptoms dictate per MPT POC.   PT Home Exercise Plan quad and HS sets, heel slides, slr   Consulted and Agree with Plan of Care Patient      Patient will benefit from skilled therapeutic intervention in order to improve the following deficits and impairments:  Abnormal gait, Decreased range of motion, Difficulty walking, Pain, Decreased strength, Increased edema, Impaired flexibility  Visit Diagnosis: Pain in right knee  Pain in left  knee  Stiffness of right knee, not elsewhere classified  Stiffness of left knee, not elsewhere classified  Localized edema     Problem List Patient Active Problem List   Diagnosis Date Noted  . Dysphagia 04/07/2012  . Fibromyalgia 04/07/2012  . GERD (gastroesophageal reflux disease) 04/07/2012  . Hypothyroid 04/07/2012  . Shingles 04/07/2012   KAhmed Prima PTA 03/10/16 2:04 PM  [facility] [address] [place] [address]Phone: [phone]  Fax:  [phone] Name: Shelly GREENMRN: [id number] of Birth: 201-31-1962  JMadelyn Flavors PT 03/12/16 8:13 AM [facility] [address] [place] [address]Phone: [phone]  Fax:  [phone]

## 2016-03-12 ENCOUNTER — Telehealth (INDEPENDENT_AMBULATORY_CARE_PROVIDER_SITE_OTHER): Payer: Self-pay | Admitting: *Deleted

## 2016-03-12 NOTE — Telephone Encounter (Signed)
Patient has recently fallen and sprained her knee and her ortho doctor wants to start her on Celebrex but he wanted her to check with you to see if ok for her to take since she has Barrett's esophagus, please advise  [phone]

## 2016-03-13 NOTE — Telephone Encounter (Signed)
Okay to use Celebrex. Addressed by Mrs. Todd.

## 2016-03-25 ENCOUNTER — Other Ambulatory Visit (INDEPENDENT_AMBULATORY_CARE_PROVIDER_SITE_OTHER): Payer: Self-pay | Admitting: *Deleted

## 2016-03-25 ENCOUNTER — Encounter (INDEPENDENT_AMBULATORY_CARE_PROVIDER_SITE_OTHER): Payer: Self-pay | Admitting: *Deleted

## 2016-03-25 DIAGNOSIS — K2270 Barrett's esophagus without dysplasia: Secondary | ICD-10-CM | POA: Insufficient documentation

## 2016-03-30 ENCOUNTER — Encounter: Payer: Self-pay | Admitting: Physical Therapy

## 2016-03-30 ENCOUNTER — Ambulatory Visit: Payer: BLUE CROSS/BLUE SHIELD | Attending: Orthopedic Surgery | Admitting: Physical Therapy

## 2016-03-30 DIAGNOSIS — M25662 Stiffness of left knee, not elsewhere classified: Secondary | ICD-10-CM | POA: Diagnosis present

## 2016-03-30 DIAGNOSIS — M25561 Pain in right knee: Secondary | ICD-10-CM

## 2016-03-30 DIAGNOSIS — M25661 Stiffness of right knee, not elsewhere classified: Secondary | ICD-10-CM

## 2016-03-30 DIAGNOSIS — R600 Localized edema: Secondary | ICD-10-CM | POA: Diagnosis present

## 2016-03-30 DIAGNOSIS — M25562 Pain in left knee: Secondary | ICD-10-CM

## 2016-03-30 NOTE — Therapy (Addendum)
[facility] [place], [place], [address] Phone: [phone]   Fax:  [phone]  Physical Therapy Treatment  Patient Details  Name: Shelly [name] MRN: [id number] Date of Birth: [birth year] Referring Provider: Georgianne Fick, MD  Encounter Date: 03/30/2016      PT End of Session - 03/30/16 1424    Visit Number 5   Number of Visits 12   Date for PT Re-Evaluation 04/07/16   PT Start Time 9357   PT Stop Time 1421   PT Time Calculation (min) 28 min   Activity Tolerance Patient tolerated treatment well   Behavior During Therapy [facility] for tasks assessed/performed      Past Medical History:  Diagnosis Date  . Acid reflux   . Adverse effect of general anesthetic    shivering recently-had to have demerol  . Arthritis   . Carpal tunnel syndrome   . Focal nodular hyperplasia of liver surgery in 2004   partial hepactectomy  . GERD (gastroesophageal reflux disease)   . Hypothyroidism   . PONV (postoperative nausea and vomiting)   . Wears glasses     Past Surgical History:  Procedure Laterality Date  . CARPAL TUNNEL RELEASE     rt  . CARPAL TUNNEL RELEASE Left 01/12/2013   Procedure: CARPAL TUNNEL RELEASE;  Surgeon: Cammie Sickle., MD;  Location: [place];  Service: Orthopedics;  Laterality: Left;  . CARPOMETACARPEL SUSPENSION PLASTY Left 07/24/2014   Procedure: LEFT THUMB CARPOMETACARPEL ([place]) ARTHROPLASTY GARCIA ELIAS ;  Surgeon: Leanora Cover, MD;  Location: [place];  Service: Orthopedics;  Laterality: Left;  . CHOLECYSTECTOMY  2004   liver resection benign tumor with GB  . COLONOSCOPY  03/05/2011   Procedure: COLONOSCOPY;  Surgeon: Rogene Houston, MD;  [facility];  Service: Endoscopy;  Laterality: N/A;  . DORSAL COMPARTMENT RELEASE Left 01/12/2013   Procedure: RELEASE DORSAL COMPARTMENT (DEQUERVAIN);  Surgeon: Cammie Sickle., MD;  Location: [facility];   Service: Orthopedics;  Laterality: Left;  . ESOPHAGOGASTRODUODENOSCOPY  03/05/2011   Procedure: ESOPHAGOGASTRODUODENOSCOPY (EGD);  Surgeon: Rogene Houston, MD;  [facility];  Service: Endoscopy;  Laterality: N/A;  . FRACTURE SURGERY  2011   rt ankle  . PARTIAL HYSTERECTOMY    . STERIOD INJECTION Left 01/12/2013   Procedure: INJECTION LEFT THUMB [place] JOINT;  Surgeon: Cammie Sickle., MD;  Location: [place];  Service: Orthopedics;  Laterality: Left;  . THYROIDECTOMY, PARTIAL    . TIBIA HARDWARE REMOVAL  2011   rt ankle   . TRIGGER FINGER RELEASE Left 01/12/2013   Procedure: RELEASE TRIGGER FINGER/A-1 PULLEY;  Surgeon: Cammie Sickle., MD;  Location: [place];  Service: Orthopedics;  Laterality: Left;    There were no vitals filed for this visit.      Subjective Assessment - 03/30/16 1358    Subjective Patient reported last treatment went well and pain has not increased after last treatment   Pertinent History Psoriatic arthritis, Arthroscopy 11/15, Ankle surgery 6/11 and 3/12.   Limitations Sitting;Walking   How long can you sit comfortably? 20-30 min (R knee)   How long can you walk comfortably? 30 min    Diagnostic tests xray neg   Patient Stated Goals to be able to walk without crutches   Currently in Pain? Yes   Pain Score 6    Pain Location Knee   Pain Orientation Right  Pain Descriptors / Indicators Stabbing;Squeezing   Pain Type Acute pain   Pain Onset 1 to 4 weeks ago   Aggravating Factors  weight bearing or getting up after sitting   Pain Relieving Factors meds   Pain Score 3   Pain Location Knee   Pain Orientation Left   Pain Descriptors / Indicators Sore   Pain Type Surgical pain   Pain Frequency Intermittent   Aggravating Factors  certain movements   Pain Relieving Factors rest                         [facility] Adult PT Treatment/Exercise - 03/30/16 0001      Electrical Stimulation    Electrical Stimulation Location right knee   Electrical Stimulation Action premod   Electrical Stimulation Parameters 1-_0    Electrical Stimulation Goals Pain;Edema     Ultrasound   Ultrasound Location right med knee   Ultrasound Parameters 1.2w/cm2/10%/3.52mzx10min   Ultrasound Goals Pain     Vasopneumatic   Number Minutes Vasopneumatic  15 minutes   Vasopnuematic Location  Knee   Vasopneumatic Pressure Medium  right knee                     PT Long Term Goals - 03/10/16 1330      PT LONG TERM GOAL #1   Title I with HEP   Time 6   Period Weeks   Status Partially Met  Completes as she can secondary to pain especially in R knee per patient report 03/10/2016     PT LONG TERM GOAL #2   Title decreased B knee pain to 2/10 to improve gait and function   Time 6   Period Weeks   Status On-going     PT LONG TERM GOAL #3   Title improved B knee ROM 0-125 Bil to improve function   Time 6   Period Weeks   Status On-going     PT LONG TERM GOAL #4   Title demo 5/5 B hip/knee strength   Time 6   Period Weeks   Status On-going     PT LONG TERM GOAL #5   Title Patient able to amb without AD safely 350 feet.   Time 6   Period Weeks   Status On-going  Currently continuing to use B Loftstrand crutches 03/10/2016               Plan - 03/30/16 1427    Clinical Impression Statement Patient progressing with all activities slowly due to pain deficits. Patient feels like last session went well and has decreased pain overall today. Patient tolerated treatment well today. patient did not feel any need for modalities in left knee today. Patient continues to have palpable pain in right medial knee. Patient goals ongoing due to pain limitations.   Rehab Potential Good   PT Frequency 2x / week   PT Duration 6 weeks   PT Treatment/Interventions ADLs/Self Care Home Management;Cryotherapy;Electrical Stimulation;Iontophoresis 437mml Dexamethasone;Ultrasound;Gait  training;Therapeutic exercise;Manual techniques;Patient/family education;Neuromuscular re-education;Balance training;Passive range of motion;Dry needling;Vasopneumatic Device   PT Next Visit Plan Continue with B knee/hip strengthening and ROM with modalities PRN for pain/edema as symptoms dictate per MPT POC. (MD. 04/09/16)   Consulted and Agree with Plan of Care Patient      Patient will benefit from skilled therapeutic intervention in order to improve the following deficits and impairments:  Abnormal gait, Decreased range of motion, Difficulty walking, Pain, Decreased strength, Increased  edema, Impaired flexibility  Visit Diagnosis: Pain in right knee  Pain in left knee  Stiffness of right knee, not elsewhere classified  Stiffness of left knee, not elsewhere classified  Localized edema     Problem List Patient Active Problem List   Diagnosis Date Noted  . Barretts esophagus 03/25/2016  . Dysphagia 04/07/2012  . Fibromyalgia 04/07/2012  . GERD (gastroesophageal reflux disease) 04/07/2012  . Hypothyroid 04/07/2012  . Shingles 04/07/2012    Phillips Climes, PTA 03/30/2016, 2:37 PM  [facility] [place], [place], [address] Phone: [phone]   Fax:  [phone]  Name: Shelly [name] MRN: [id number] Date of Birth: 1960/12/05  PHYSICAL THERAPY DISCHARGE SUMMARY  Visits from Start of Care:5.  Current functional level related to goals / functional outcomes: See above.   Remaining deficits: Continued knee pain.   Education / Equipment: HEP. Plan: Patient agrees to discharge.  Patient goals were not met. Patient is being discharged due to not returning since the last visit.  ?????         Mali Applegate MPT

## 2016-04-13 ENCOUNTER — Telehealth (INDEPENDENT_AMBULATORY_CARE_PROVIDER_SITE_OTHER): Payer: Self-pay | Admitting: *Deleted

## 2016-04-13 NOTE — Telephone Encounter (Signed)
Referring MD/PCP: butler   Procedure: egd  Reason/Indication:  Barrett's esophagus  Has patient had this procedure before?  Yes, 2013 -- epic  If so, when, by whom and where?    Is there a family history of colon cancer?    Who?  What age when diagnosed?    Is patient diabetic?   no      Does patient have prosthetic heart valve or mechanical valve?  no  Do you have a pacemaker?  no  Has patient ever had endocarditis? no  Has patient had joint replacement within last 12 months?  no  Does patient tend to be constipated or take laxatives? no  Does patient have a history of alcohol/drug use?  no  Is patient on Coumadin, Plavix and/or Aspirin? no  Medications: nexium daily, armour thyroid daily, arecena daily  Allergies: see epic  Medication Adjustment:   Procedure date & time: 05/14/16 at 300

## 2016-04-14 NOTE — Telephone Encounter (Signed)
agree

## 2016-05-14 ENCOUNTER — Encounter (HOSPITAL_COMMUNITY)
Admission: RE | Disposition: A | Discharge status: Home / Self Care | Payer: Self-pay | Source: Ambulatory Visit | Attending: Internal Medicine

## 2016-05-14 ENCOUNTER — Ambulatory Visit (HOSPITAL_COMMUNITY)
Admission: RE | Admit: 2016-05-14 | Discharge: 2016-05-14 | Disposition: A | Discharge status: Home / Self Care | Payer: BLUE CROSS/BLUE SHIELD | Source: Ambulatory Visit | Attending: Internal Medicine | Admitting: Internal Medicine

## 2016-05-14 ENCOUNTER — Encounter (HOSPITAL_COMMUNITY): Payer: Self-pay | Admitting: *Deleted

## 2016-05-14 DIAGNOSIS — E890 Postprocedural hypothyroidism: Secondary | ICD-10-CM | POA: Diagnosis not present

## 2016-05-14 DIAGNOSIS — K449 Diaphragmatic hernia without obstruction or gangrene: Secondary | ICD-10-CM | POA: Diagnosis not present

## 2016-05-14 DIAGNOSIS — G5600 Carpal tunnel syndrome, unspecified upper limb: Secondary | ICD-10-CM | POA: Diagnosis not present

## 2016-05-14 DIAGNOSIS — Z87891 Personal history of nicotine dependence: Secondary | ICD-10-CM | POA: Diagnosis not present

## 2016-05-14 DIAGNOSIS — R1310 Dysphagia, unspecified: Secondary | ICD-10-CM | POA: Diagnosis not present

## 2016-05-14 DIAGNOSIS — R1314 Dysphagia, pharyngoesophageal phase: Secondary | ICD-10-CM

## 2016-05-14 DIAGNOSIS — K317 Polyp of stomach and duodenum: Secondary | ICD-10-CM | POA: Insufficient documentation

## 2016-05-14 DIAGNOSIS — Z91012 Allergy to eggs: Secondary | ICD-10-CM | POA: Diagnosis not present

## 2016-05-14 DIAGNOSIS — K2980 Duodenitis without bleeding: Secondary | ICD-10-CM | POA: Insufficient documentation

## 2016-05-14 DIAGNOSIS — Z91018 Allergy to other foods: Secondary | ICD-10-CM | POA: Diagnosis not present

## 2016-05-14 DIAGNOSIS — K219 Gastro-esophageal reflux disease without esophagitis: Secondary | ICD-10-CM | POA: Diagnosis not present

## 2016-05-14 DIAGNOSIS — Z882 Allergy status to sulfonamides status: Secondary | ICD-10-CM | POA: Diagnosis not present

## 2016-05-14 DIAGNOSIS — Z881 Allergy status to other antibiotic agents status: Secondary | ICD-10-CM | POA: Insufficient documentation

## 2016-05-14 DIAGNOSIS — K2270 Barrett's esophagus without dysplasia: Secondary | ICD-10-CM

## 2016-05-14 DIAGNOSIS — Z90710 Acquired absence of both cervix and uterus: Secondary | ICD-10-CM | POA: Diagnosis not present

## 2016-05-14 DIAGNOSIS — M1990 Unspecified osteoarthritis, unspecified site: Secondary | ICD-10-CM | POA: Insufficient documentation

## 2016-05-14 DIAGNOSIS — Z888 Allergy status to other drugs, medicaments and biological substances status: Secondary | ICD-10-CM | POA: Diagnosis not present

## 2016-05-14 DIAGNOSIS — Z79899 Other long term (current) drug therapy: Secondary | ICD-10-CM | POA: Insufficient documentation

## 2016-05-14 DIAGNOSIS — K222 Esophageal obstruction: Secondary | ICD-10-CM | POA: Insufficient documentation

## 2016-05-14 HISTORY — PX: ESOPHAGOGASTRODUODENOSCOPY: SHX5428

## 2016-05-14 SURGERY — EGD (ESOPHAGOGASTRODUODENOSCOPY)
Anesthesia: Moderate Sedation

## 2016-05-14 MED ORDER — BUTAMBEN-TETRACAINE-BENZOCAINE 2-2-14 % EX AERO
INHALATION_SPRAY | CUTANEOUS | Status: DC | PRN
  Administered 2016-05-14: 2 via TOPICAL

## 2016-05-14 MED ORDER — MEPERIDINE HCL 50 MG/ML IJ SOLN
INTRAMUSCULAR | Status: AC
  Filled 2016-05-14: qty 1

## 2016-05-14 MED ORDER — PROMETHAZINE HCL 25 MG/ML IJ SOLN
INTRAMUSCULAR | Status: AC
  Administered 2016-05-14: 25 mg
  Filled 2016-05-14: qty 1

## 2016-05-14 MED ORDER — MEPERIDINE HCL 50 MG/ML IJ SOLN
INTRAMUSCULAR | Status: DC | PRN
  Administered 2016-05-14 (×2): 25 mg via INTRAVENOUS

## 2016-05-14 MED ORDER — SODIUM CHLORIDE 0.9% FLUSH
INTRAVENOUS | Status: DC
Start: 2016-05-14 — End: 2016-05-14
  Filled 2016-05-14: qty 10

## 2016-05-14 MED ORDER — PROMETHAZINE HCL 25 MG/ML IJ SOLN: 12.5000 mg | Freq: Once | INTRAMUSCULAR | Status: DC

## 2016-05-14 MED ORDER — MIDAZOLAM HCL 5 MG/5ML IJ SOLN
INTRAMUSCULAR | Status: AC
  Filled 2016-05-14: qty 10

## 2016-05-14 MED ORDER — MIDAZOLAM HCL 5 MG/5ML IJ SOLN
INTRAMUSCULAR | Status: DC | PRN
  Administered 2016-05-14: 3 mg via INTRAVENOUS
  Administered 2016-05-14: 1 mg via INTRAVENOUS
  Administered 2016-05-14: 2 mg via INTRAVENOUS
  Administered 2016-05-14: 3 mg via INTRAVENOUS

## 2016-05-14 MED ORDER — SODIUM CHLORIDE 0.9 % IV SOLN
INTRAVENOUS | Status: DC
  Administered 2016-05-14: 15:00:00 via INTRAVENOUS

## 2016-05-14 MED ORDER — STERILE WATER FOR IRRIGATION IR SOLN
Status: DC | PRN
  Administered 2016-05-14: 100 mL

## 2016-05-14 NOTE — Op Note (Signed)
[facility] Patient Name: Shelly [name] Procedure Date: 05/14/2016 3:06 PM MRN: [id number] Date of Birth: 08/03/1961 Attending MD: Hildred Laser , MD CSN: [id number] Age: 55 Admit Type: Outpatient Procedure:                Upper GI endoscopy Indications:              Esophageal dysphagia, Barrett's esophagus Providers:                Hildred Laser, MD, Rosina Lowenstein, RN, Randa Spike, Technician Referring MD:             Octavio Graves DO Medicines:                Cetacaine spray, Promethazine 12.5 mg IV,                            Meperidine 50 mg IV, Midazolam 9 mg IV Complications:            No immediate complications. Estimated Blood Loss:     Estimated blood loss was minimal. Procedure:                Pre-Anesthesia Assessment:                           - Prior to the procedure, a History and Physical                            was performed, and patient medications and                            allergies were reviewed. The patient's tolerance of                            previous anesthesia was also reviewed. The risks                            and benefits of the procedure and the sedation                            options and risks were discussed with the patient.                            All questions were answered, and informed consent                            was obtained. Prior Anticoagulants: The patient has                            taken no previous anticoagulant or antiplatelet                            agents. ASA Grade Assessment: II - A patient with  mild systemic disease. After reviewing the risks                            and benefits, the patient was deemed in                            satisfactory condition to undergo the procedure.                           After obtaining informed consent, the endoscope was                            passed under direct vision. Throughout the                procedure, the patient's blood pressure, pulse, and                            oxygen saturations were monitored continuously. The                            [id number]) was introduced through the                            mouth, and advanced to the second part of duodenum.                            The upper GI endoscopy was accomplished without                            difficulty. The patient tolerated the procedure                            well. Scope In: 3:21:14 PM Scope Out: 3:36:48 PM Total Procedure Duration: 0 hours 15 minutes 34 seconds  Findings:      Islands of salmon-colored mucosa were present at 32 cm. No other visible       abnormalities were present. Biopsies were taken with a cold forceps for       histology. The pathology specimen was placed into Bottle Number 2.      A non-obstructing Schatzki ring (acquired) was found at the lower       esophageal sphincter. The scope was withdrawn. Dilation was performed       with a Maloney dilator with no resistance at 34 Fr. The dilation site       was examined following endoscope reinsertion and showed no change.      A 5 cm hiatal hernia was present.      A few small sessile polyps were found in the gastric fundus and in the       gastric body.      The exam of the stomach was otherwise normal.      The duodenal bulb was normal.      Localized mucosal flattening was found in the second portion of the       duodenum. Biopsies were taken with a cold forceps for histology. The       pathology specimen was placed into Bottle Number  1. Impression:               - Two small islands of salmon-colored mucosa                            suspicious for Barrett's esophagus. Biopsied.                           - Non-obstructing Schatzki ring at GE junction.                            Dilated.                           - 5 cm hiatal hernia.                           - A few gastric polyps.                            - Normal duodenal bulb.                           - Flattened mucosa was found in the duodenum.                            Biopsied. Moderate Sedation:      Moderate (conscious) sedation was administered by the endoscopy nurse       and supervised by the endoscopist. The following parameters were       monitored: oxygen saturation, heart rate, blood pressure, CO2       capnography and response to care. Total physician intraservice time was       20 minutes. Recommendation:           - Patient has a contact number available for                            emergencies. The signs and symptoms of potential                            delayed complications were discussed with the                            patient. Return to normal activities tomorrow.                            Written discharge instructions were provided to the                            patient.                           - Resume previous diet today.                           - Continue present medications.                           -  Await pathology results.                           - Return to GI clinic in 1 year. Procedure Code(s):        --- Professional ---                           [phone], Esophagogastroduodenoscopy, flexible,                            transoral; with biopsy, single or multiple                           43450, Dilation of esophagus, by unguided sound or                            bougie, single or multiple passes                           99152, Moderate sedation services provided by the                            same physician or other qualified health care                            professional performing the diagnostic or                            therapeutic service that the sedation supports,                            requiring the presence of an independent trained                            observer to assist in the monitoring of the                            patient's level of  consciousness and physiological                            status; initial 15 minutes of intraservice time,                            patient age 25 years or older Diagnosis Code(s):        --- Professional ---                           K22.70, Barrett's esophagus without dysplasia                           K22.8, Other specified diseases of esophagus                           K22.2, Esophageal obstruction  K44.9, Diaphragmatic hernia without obstruction or                            gangrene                           K31.7, Polyp of stomach and duodenum                           K31.89, Other diseases of stomach and duodenum                           R13.14, Dysphagia, pharyngoesophageal phase CPT copyright 2016 American Medical Association. All rights reserved. The codes documented in this report are preliminary and upon coder review may  be revised to meet current compliance requirements. Hildred Laser, MD Hildred Laser, MD 05/14/2016 3:51:29 PM This report has been signed electronically. Number of Addenda: 0

## 2016-05-14 NOTE — H&P (Addendum)
Shelly [name] is an 55 y.o. female.   Chief Complaint: Patient is here for EGD and possible edition HPI: Patient is 55 year old Caucasian female was chronic GERD complicated by short segment Barrett's esophagus was last EGD was in August 2013 revealing Schatzki's ring short segment Barrett's and hiatal hernia. She now presents with intermittent solid food dysphagia. She feels heartburn is well controlled with dietary measures and Nexium 20 mg daily. She denies nausea vomiting melena.  Past Medical History:  Diagnosis Date  . Acid reflux   . Adverse effect of general anesthetic    shivering recently-had to have demerol  . Arthritis   . Carpal tunnel syndrome   . Focal nodular hyperplasia of liver surgery in 2004   partial hepactectomy  . GERD (gastroesophageal reflux disease)   . Hypothyroidism   . PONV (postoperative nausea and vomiting)   . Wears glasses     Past Surgical History:  Procedure Laterality Date  . CARPAL TUNNEL RELEASE     rt  . CARPAL TUNNEL RELEASE Left 01/12/2013   Procedure: CARPAL TUNNEL RELEASE;  Surgeon: Cammie Sickle., MD;  Location: [place];  Service: Orthopedics;  Laterality: Left;  . CARPOMETACARPEL SUSPENSION PLASTY Left 07/24/2014   Procedure: LEFT THUMB CARPOMETACARPEL ([place]) ARTHROPLASTY GARCIA ELIAS ;  Surgeon: Leanora Cover, MD;  Location: [place];  Service: Orthopedics;  Laterality: Left;  . CHOLECYSTECTOMY  2004   liver resection benign tumor with GB  . COLONOSCOPY  03/05/2011   Procedure: COLONOSCOPY;  Surgeon: Rogene Houston, MD;  [facility];  Service: Endoscopy;  Laterality: N/A;  . DORSAL COMPARTMENT RELEASE Left 01/12/2013   Procedure: RELEASE DORSAL COMPARTMENT (DEQUERVAIN);  Surgeon: Cammie Sickle., MD;  Location: [facility];  Service: Orthopedics;  Laterality: Left;  . ESOPHAGOGASTRODUODENOSCOPY  03/05/2011   Procedure: ESOPHAGOGASTRODUODENOSCOPY (EGD);  Surgeon: Rogene Houston, MD;  [facility];  Service: Endoscopy;  Laterality: N/A;  . FRACTURE SURGERY  2011   rt ankle  . PARTIAL HYSTERECTOMY    . STERIOD INJECTION Left 01/12/2013   Procedure: INJECTION LEFT THUMB [place] JOINT;  Surgeon: Cammie Sickle., MD;  Location: [place];  Service: Orthopedics;  Laterality: Left;  . THYROIDECTOMY, PARTIAL    . TIBIA HARDWARE REMOVAL  2011   rt ankle   . TRIGGER FINGER RELEASE Left 01/12/2013   Procedure: RELEASE TRIGGER FINGER/A-1 PULLEY;  Surgeon: Cammie Sickle., MD;  Location: [place];  Service: Orthopedics;  Laterality: Left;    Family History  Problem Relation Age of Onset  . Anesthesia problems Neg Hx   . Hypotension Neg Hx   . Malignant hyperthermia Neg Hx   . Pseudochol deficiency Neg Hx    Social History:  reports that she quit smoking about 2 years ago. Her smoking use included Cigarettes. She has a 0.12 pack-year smoking history. She has never used smokeless tobacco. She reports that she drinks alcohol. She reports that she does not use drugs.  Allergies:  Allergies  Allergen Reactions  . Ciprofloxacin     Patient states that it was neuro pain , could hardly walk,.  . Dairy Aid [Lactase]   . Eggs Or Egg-Derived Products   . Nsaids Other (See Comments)    Causes Gastric Problems   . Other     gluten  . Sulfa Antibiotics Hives  . Wheat Bran     Medications Prior to Admission  Medication Sig Dispense Refill  . cyclobenzaprine (FLEXERIL) 5 MG tablet Take 5 mg by mouth as needed for muscle spasms.    Marland Kitchen esomeprazole (NEXIUM) 20 MG capsule Take 2 capsules (40 mg total) by mouth daily before breakfast. 30 capsule 0  . thyroid (ARMOUR) 60 MG tablet Take 120 mg by mouth daily.     Marland Kitchen gabapentin (NEURONTIN) 300 MG capsule Take 300 mg by mouth at bedtime. Reported on 02/25/2016    . HYDROcodone-acetaminophen (NORCO) 5-325 MG per tablet 1-2 tabs po q6 hours prn pain 40 tablet 0  . InFLIXimab (REMICADE  IV) Inject 550 mg into the vein. Reported on 02/25/2016    . topiramate (TOPAMAX) 50 MG tablet Take 50 mg by mouth 2 (two) times daily.      No results found for this or any previous visit (from the past 48 hour(s)). No results found.  ROS  Blood pressure 137/85, pulse 72, temperature 97.9 F (36.6 C), temperature source Oral, resp. rate 16, height 5\' 6"  (1.676 m), weight 220 lb (99.8 kg), SpO2 97 %. Physical Exam  Constitutional: She appears well-developed and well-nourished.  HENT:  Mouth/Throat: Oropharynx is clear and moist.  Eyes: Conjunctivae are normal. No scleral icterus.  Neck: No thyromegaly present.  Cardiovascular: Normal rate, regular rhythm and normal heart sounds.   No murmur heard. Respiratory: Effort normal and breath sounds normal.  GI:  Abdomen is full. It symmetrical soft with mild midepigastric tenderness. No organomegaly or masses  Musculoskeletal: She exhibits no edema.  Lymphadenopathy:    She has no cervical adenopathy.  Neurological: She is alert.  Skin: Skin is warm and dry.     Assessment/Plan Short segment Barrett's esophagus and esophageal dysphagia. EGD possible ED.  Hildred Laser, MD 05/14/2016, 3:10 PM

## 2016-05-14 NOTE — Discharge Instructions (Signed)
Resume usual medications and diet. No driving for 24 hours. Physician will call with biopsy results.       Esophagogastroduodenoscopy, Care After Refer to this sheet in the next few weeks. These instructions provide you with information about caring for yourself after your procedure. Your health care provider may also give you more specific instructions. Your treatment has been planned according to current medical practices, but problems sometimes occur. Call your health care provider if you have any problems or questions after your procedure. WHAT TO EXPECT AFTER THE PROCEDURE After your procedure, it is typical to feel:  Soreness in your throat.  Pain with swallowing.  Sick to your stomach (nauseous).  Bloated.  Dizzy.  Fatigued. HOME CARE INSTRUCTIONS  Do not eat or drink anything until the numbing medicine (local anesthetic) has worn off and your gag reflex has returned. You will know that the local anesthetic has worn off when you can swallow comfortably.  Do not drive or operate machinery until directed by your health care provider.  Take medicines only as directed by your health care provider. SEEK MEDICAL CARE IF:   You cannot stop coughing.  You are not urinating at all or less than usual. SEEK IMMEDIATE MEDICAL CARE IF:  You have difficulty swallowing.  You cannot eat or drink.  You have worsening throat or chest pain.  You have dizziness or lightheadedness or you faint.  You have nausea or vomiting.  You have chills.  You have a fever.  You have severe abdominal pain.  You have black, tarry, or bloody stools.   This information is not intended to replace advice given to you by your health care provider. Make sure you discuss any questions you have with your health care provider.   Document Released: 07/20/2012 Document Revised: 08/24/2014 Document Reviewed: 07/20/2012 Elsevier Interactive Patient Education [employer].

## 2016-05-18 ENCOUNTER — Encounter (HOSPITAL_COMMUNITY): Payer: Self-pay | Admitting: Internal Medicine

## 2016-06-11 DIAGNOSIS — Z87828 Personal history of other (healed) physical injury and trauma: Secondary | ICD-10-CM | POA: Insufficient documentation

## 2016-07-03 DIAGNOSIS — M792 Neuralgia and neuritis, unspecified: Secondary | ICD-10-CM | POA: Insufficient documentation

## 2016-07-03 DIAGNOSIS — G8929 Other chronic pain: Secondary | ICD-10-CM | POA: Insufficient documentation

## 2016-08-03 ENCOUNTER — Other Ambulatory Visit: Payer: Self-pay | Admitting: Physician Assistant

## 2017-08-21 DIAGNOSIS — N3946 Mixed incontinence: Secondary | ICD-10-CM | POA: Insufficient documentation

## 2018-03-10 ENCOUNTER — Emergency Department (HOSPITAL_COMMUNITY)
Admission: EM | Admit: 2018-03-10 | Discharge: 2018-03-10 | Disposition: A | Discharge status: Home / Self Care | Payer: BLUE CROSS/BLUE SHIELD | Attending: Emergency Medicine | Admitting: Emergency Medicine

## 2018-03-10 ENCOUNTER — Other Ambulatory Visit: Payer: Self-pay

## 2018-03-10 ENCOUNTER — Emergency Department (HOSPITAL_COMMUNITY): Payer: BLUE CROSS/BLUE SHIELD

## 2018-03-10 ENCOUNTER — Encounter (HOSPITAL_COMMUNITY): Payer: Self-pay | Admitting: Emergency Medicine

## 2018-03-10 DIAGNOSIS — R55 Syncope and collapse: Secondary | ICD-10-CM | POA: Diagnosis not present

## 2018-03-10 DIAGNOSIS — E039 Hypothyroidism, unspecified: Secondary | ICD-10-CM | POA: Diagnosis not present

## 2018-03-10 DIAGNOSIS — R0789 Other chest pain: Secondary | ICD-10-CM | POA: Diagnosis present

## 2018-03-10 DIAGNOSIS — I493 Ventricular premature depolarization: Secondary | ICD-10-CM | POA: Diagnosis not present

## 2018-03-10 DIAGNOSIS — Z79899 Other long term (current) drug therapy: Secondary | ICD-10-CM | POA: Diagnosis not present

## 2018-03-10 DIAGNOSIS — R079 Chest pain, unspecified: Secondary | ICD-10-CM

## 2018-03-10 DIAGNOSIS — Z87891 Personal history of nicotine dependence: Secondary | ICD-10-CM | POA: Diagnosis not present

## 2018-03-10 LAB — BASIC METABOLIC PANEL
Anion gap: 7 (ref 5–15)
BUN: 10 mg/dL (ref 6–20)
CALCIUM: 9.4 mg/dL (ref 8.9–10.3)
CHLORIDE: 108 mmol/L (ref 98–111)
CO2: 25 mmol/L (ref 22–32)
Creatinine, Ser: 0.68 mg/dL (ref 0.44–1.00)
GFR calc Af Amer: >60 mL/min (ref >60)
GFR calc non Af Amer: >60 mL/min (ref >60)
GLUCOSE: 130 mg/dL — AB (ref 70–99)
POTASSIUM: 4.5 mmol/L (ref 3.5–5.1)
Sodium: 140 mmol/L (ref 135–145)

## 2018-03-10 LAB — TROPONIN I: Troponin I: <0.03 ng/mL (ref <0.03)

## 2018-03-10 LAB — CBC
HEMATOCRIT: 40 % (ref 36.0–46.0)
HEMOGLOBIN: 13 g/dL (ref 12.0–15.0)
MCH: 29.3 pg (ref 26.0–34.0)
MCHC: 32.5 g/dL (ref 30.0–36.0)
MCV: 90.3 fL (ref 78.0–100.0)
Platelets: 225 10*3/uL (ref 150–400)
RBC: 4.43 MIL/uL (ref 3.87–5.11)
RDW: 13.5 % (ref 11.5–15.5)
WBC: 5.7 10*3/uL (ref 4.0–10.5)

## 2018-03-10 NOTE — Discharge Instructions (Addendum)
Tests showed no life-threatening condition.  Follow-up with your primary care doctor or cardiologist.  Phone number given.  Avoid stimulants

## 2018-03-10 NOTE — ED Provider Notes (Signed)
[facility] EMERGENCY DEPARTMENT Provider Note   CSN: [id number] Arrival date & time: 03/10/18  1159     History   Chief Complaint Chief Complaint  Patient presents with  . Chest Pain    HPI Shelly [name] is a 57 y.o. female.  Sensation of heart fluttering with associated feeling faint approximately 2 weeks ago.  On Monday she had an additional sense of palpitations with right-sided chest pain.  No dyspnea, diaphoresis, nausea.  No previous history of coronary artery disease.  Non-smoker.  Severity of symptoms is mild to moderate.  Nothing makes symptoms better or worse.     Past Medical History:  Diagnosis Date  . Acid reflux   . Adverse effect of general anesthetic    shivering recently-had to have demerol  . Arthritis   . Carpal tunnel syndrome   . Focal nodular hyperplasia of liver surgery in 2004   partial hepactectomy  . GERD (gastroesophageal reflux disease)   . Hypothyroidism   . PONV (postoperative nausea and vomiting)   . Wears glasses     Patient Active Problem List   Diagnosis Date Noted  . Barretts esophagus 03/25/2016  . Dysphagia 04/07/2012  . Fibromyalgia 04/07/2012  . GERD (gastroesophageal reflux disease) 04/07/2012  . Hypothyroid 04/07/2012  . Shingles 04/07/2012    Past Surgical History:  Procedure Laterality Date  . CARPAL TUNNEL RELEASE     rt  . CARPAL TUNNEL RELEASE Left 01/12/2013   Procedure: CARPAL TUNNEL RELEASE;  Surgeon: Cammie Sickle., MD;  Location: [place];  Service: Orthopedics;  Laterality: Left;  . CARPOMETACARPEL SUSPENSION PLASTY Left 07/24/2014   Procedure: LEFT THUMB CARPOMETACARPEL ([place]) ARTHROPLASTY GARCIA ELIAS ;  Surgeon: Leanora Cover, MD;  Location: [place];  Service: Orthopedics;  Laterality: Left;  . CHOLECYSTECTOMY  2004   liver resection benign tumor with GB  . COLONOSCOPY  03/05/2011   Procedure: COLONOSCOPY;  Surgeon: Rogene Houston, MD;  [facility];   Service: Endoscopy;  Laterality: N/A;  . DORSAL COMPARTMENT RELEASE Left 01/12/2013   Procedure: RELEASE DORSAL COMPARTMENT (DEQUERVAIN);  Surgeon: Cammie Sickle., MD;  Location: [facility];  Service: Orthopedics;  Laterality: Left;  . ESOPHAGOGASTRODUODENOSCOPY  03/05/2011   Procedure: ESOPHAGOGASTRODUODENOSCOPY (EGD);  Surgeon: Rogene Houston, MD;  [facility];  Service: Endoscopy;  Laterality: N/A;  . ESOPHAGOGASTRODUODENOSCOPY N/A 05/14/2016   Procedure: ESOPHAGOGASTRODUODENOSCOPY (EGD);  Surgeon: Rogene Houston, MD;  [facility];  Service: Endoscopy;  Laterality: N/A;  300  . FRACTURE SURGERY  2011   rt ankle  . PARTIAL HYSTERECTOMY    . STERIOD INJECTION Left 01/12/2013   Procedure: INJECTION LEFT THUMB [place] JOINT;  Surgeon: Cammie Sickle., MD;  Location: [place];  Service: Orthopedics;  Laterality: Left;  . THYROIDECTOMY, PARTIAL    . TIBIA HARDWARE REMOVAL  2011   rt ankle   . TRIGGER FINGER RELEASE Left 01/12/2013   Procedure: RELEASE TRIGGER FINGER/A-1 PULLEY;  Surgeon: Cammie Sickle., MD;  Location: [place];  Service: Orthopedics;  Laterality: Left;     OB History   None      Home Medications    Prior to Admission medications   Medication Sig Start Date End Date Taking? Authorizing Provider  esomeprazole (NEXIUM) 20 MG capsule Take 2 capsules (40 mg total) by mouth daily before breakfast. 02/21/13  Yes Carmin Muskrat, MD  metoprolol succinate (TOPROL-XL)  25 MG 24 hr tablet Take 1 tablet by mouth at bedtime. 04/09/17  Yes [provider]  Secukinumab (COSENTYX SENSOREADY 300 DOSE) 150 MG/ML SOAJ Takes injection once a month 06/07/17  Yes [provider]  thyroid (ARMOUR) 60 MG tablet Take 120 mg by mouth daily.    Yes [provider]  Topiramate ER (TROKENDI XR) 50 MG CP24  06/16/17  Yes [provider]    Family History Family History  Problem  Relation Age of Onset  . Anesthesia problems Neg Hx   . Hypotension Neg Hx   . Malignant hyperthermia Neg Hx   . Pseudochol deficiency Neg Hx     Social History Social History   Tobacco Use  . Smoking status: Former Smoker    Packs/day: 0.25    Years: 0.50    Pack years: 0.12    Types: Cigarettes    Last attempt to quit: 07/19/2013    Years since quitting: 4.6  . Smokeless tobacco: Never Used  Substance Use Topics  . Alcohol use: Yes    Comment: occ  . Drug use: No     Allergies   Ciprofloxacin; Dairy aid [lactase]; Eggs or egg-derived products; Nsaids; Other; Sulfa antibiotics; and Wheat bran   Review of Systems Review of Systems  All other systems reviewed and are negative.    Physical Exam Updated Vital Signs BP 122/68   Pulse (!) 55   Temp (!) 97.3 F (36.3 C) (Oral)   Resp 13   Ht 5\' 6"  (1.676 m)   Wt 99.8 kg (220 lb)   SpO2 100%   BMI 35.51 kg/m   Physical Exam  Constitutional: She is oriented to person, place, and time. She appears well-developed and well-nourished.  HENT:  Head: Normocephalic and atraumatic.  Eyes: Conjunctivae are normal.  Neck: Neck supple.  Cardiovascular: Regular rhythm. Bradycardia present.  Pulmonary/Chest: Effort normal and breath sounds normal.  Abdominal: Soft. Bowel sounds are normal.  Musculoskeletal: Normal range of motion.  Neurological: She is alert and oriented to person, place, and time.  Skin: Skin is warm and dry.  Psychiatric: She has a normal mood and affect. Her behavior is normal.  Nursing note and vitals reviewed.    ED Treatments / Results  Labs (all labs ordered are listed, but only abnormal results are displayed) Labs Reviewed  BASIC METABOLIC PANEL - Abnormal; Notable for the following components:      Result Value   Glucose, Bld 130 (*)    All other components within normal limits  CBC  TROPONIN I    EKG EKG Interpretation  Date/Time:  Thursday March 10 2018 12:08:52 EDT Ventricular  Rate:  54 PR Interval:    QRS Duration: 107 QT Interval:  473 QTC Calculation: 449 R Axis:   70 Text Interpretation:  Sinus rhythm Low voltage, precordial leads Confirmed by Nat Christen [phone]) on 03/10/2018 12:44:37 PM   Radiology Dg Chest 2 View  Result Date: 03/10/2018 CLINICAL DATA:  Chest pain. EXAM: CHEST - 2 VIEW COMPARISON:  Radiographs of July 18, 2012. FINDINGS: The heart size and mediastinal contours are within normal limits. Both lungs are clear. No pneumothorax or pleural effusion is noted. The visualized skeletal structures are unremarkable. IMPRESSION: No active cardiopulmonary disease. Electronically Signed   By: Marijo Conception, M.D.   On: 03/10/2018 14:35    Procedures Procedures (including critical care time)  Medications Ordered in ED Medications - No data to display   Initial Impression / Assessment  and Plan / ED Course  I have reviewed the triage vital signs and the nursing notes.  Pertinent labs & imaging results that were available during my care of the patient were reviewed by me and considered in my medical decision making (see chart for details).     Patient presents with a sensation of heart fluttering, palpitations, chest pain.  She is slightly overweight, but otherwise has a normal physical exam.  She is slightly bradycardic.  An occasional PVC was noted on her heart monitor.  Screening labs, EKG, chest x-ray, troponin all show no acute findings.  These findings were discussed with the patient and her husband.  Final Clinical Impressions(s) / ED Diagnoses   Final diagnoses:  Chest pain, unspecified type  PVC (premature ventricular contraction)    ED Discharge Orders    None       Nat Christen, MD 03/10/18 1545

## 2018-03-10 NOTE — ED Triage Notes (Signed)
Per EMS: Patient from [facility]. Patient sent here for chest pain and sinus bradycardia on EKG. Patient is alert x 4 in triage. NAD.

## 2018-03-19 ENCOUNTER — Other Ambulatory Visit: Payer: Self-pay

## 2018-03-19 ENCOUNTER — Emergency Department (HOSPITAL_COMMUNITY): Payer: BLUE CROSS/BLUE SHIELD

## 2018-03-19 ENCOUNTER — Observation Stay (HOSPITAL_COMMUNITY)
Admission: EM | Admit: 2018-03-19 | Discharge: 2018-03-21 | Disposition: A | Discharge status: Home / Self Care | Payer: BLUE CROSS/BLUE SHIELD | Attending: Internal Medicine | Admitting: Internal Medicine

## 2018-03-19 ENCOUNTER — Encounter (HOSPITAL_COMMUNITY): Payer: Self-pay | Admitting: Emergency Medicine

## 2018-03-19 DIAGNOSIS — Z7982 Long term (current) use of aspirin: Secondary | ICD-10-CM | POA: Insufficient documentation

## 2018-03-19 DIAGNOSIS — I471 Supraventricular tachycardia, unspecified: Secondary | ICD-10-CM

## 2018-03-19 DIAGNOSIS — R0602 Shortness of breath: Secondary | ICD-10-CM | POA: Insufficient documentation

## 2018-03-19 DIAGNOSIS — Z87891 Personal history of nicotine dependence: Secondary | ICD-10-CM | POA: Insufficient documentation

## 2018-03-19 DIAGNOSIS — R739 Hyperglycemia, unspecified: Secondary | ICD-10-CM | POA: Diagnosis not present

## 2018-03-19 DIAGNOSIS — E876 Hypokalemia: Secondary | ICD-10-CM | POA: Diagnosis present

## 2018-03-19 DIAGNOSIS — Z79899 Other long term (current) drug therapy: Secondary | ICD-10-CM | POA: Diagnosis not present

## 2018-03-19 DIAGNOSIS — R002 Palpitations: Secondary | ICD-10-CM | POA: Diagnosis present

## 2018-03-19 DIAGNOSIS — E039 Hypothyroidism, unspecified: Secondary | ICD-10-CM | POA: Diagnosis not present

## 2018-03-19 DIAGNOSIS — K219 Gastro-esophageal reflux disease without esophagitis: Secondary | ICD-10-CM | POA: Diagnosis present

## 2018-03-19 HISTORY — DX: Supraventricular tachycardia: I47.1

## 2018-03-19 HISTORY — DX: Supraventricular tachycardia, unspecified: I47.10

## 2018-03-19 LAB — BRAIN NATRIURETIC PEPTIDE: B NATRIURETIC PEPTIDE 5: 52 pg/mL (ref 0.0–100.0)

## 2018-03-19 LAB — CBC
HCT: 39.4 % (ref 36.0–46.0)
Hemoglobin: 12.9 g/dL (ref 12.0–15.0)
MCH: 29.6 pg (ref 26.0–34.0)
MCHC: 32.7 g/dL (ref 30.0–36.0)
MCV: 90.4 fL (ref 78.0–100.0)
PLATELETS: 245 10*3/uL (ref 150–400)
RBC: 4.36 MIL/uL (ref 3.87–5.11)
RDW: 13.6 % (ref 11.5–15.5)
WBC: 7.5 10*3/uL (ref 4.0–10.5)

## 2018-03-19 LAB — BASIC METABOLIC PANEL
Anion gap: 6 (ref 5–15)
BUN: 14 mg/dL (ref 6–20)
CHLORIDE: 108 mmol/L (ref 98–111)
CO2: 25 mmol/L (ref 22–32)
CREATININE: 0.8 mg/dL (ref 0.44–1.00)
Calcium: 8.8 mg/dL — ABNORMAL LOW (ref 8.9–10.3)
GFR calc Af Amer: >60 mL/min (ref >60)
GFR calc non Af Amer: >60 mL/min (ref >60)
GLUCOSE: 200 mg/dL — AB (ref 70–99)
Potassium: 3.3 mmol/L — ABNORMAL LOW (ref 3.5–5.1)
SODIUM: 139 mmol/L (ref 135–145)

## 2018-03-19 LAB — I-STAT BETA HCG BLOOD, ED (MC, WL, AP ONLY): I-stat hCG, quantitative: <5 m[IU]/mL (ref <5)

## 2018-03-19 LAB — MAGNESIUM: MAGNESIUM: 1.8 mg/dL (ref 1.7–2.4)

## 2018-03-19 LAB — I-STAT TROPONIN, ED: Troponin i, poc: 0 ng/mL (ref 0.00–0.08)

## 2018-03-19 LAB — TSH: TSH: 1.001 u[IU]/mL (ref 0.350–4.500)

## 2018-03-19 MED ORDER — POTASSIUM CHLORIDE CRYS ER 20 MEQ PO TBCR
40.0000 meq | EXTENDED_RELEASE_TABLET | Freq: Once | ORAL | Status: AC
  Administered 2018-03-19: 40 meq via ORAL
  Filled 2018-03-19: qty 2

## 2018-03-19 MED ORDER — MAGNESIUM SULFATE 2 GM/50ML IV SOLN
2.0000 g | Freq: Once | INTRAVENOUS | Status: AC
  Administered 2018-03-19: 2 g via INTRAVENOUS
  Filled 2018-03-19: qty 50

## 2018-03-19 MED ORDER — POTASSIUM CHLORIDE 10 MEQ/100ML IV SOLN
10.0000 meq | INTRAVENOUS | Status: AC
  Administered 2018-03-19: 10 meq via INTRAVENOUS
  Filled 2018-03-19: qty 100

## 2018-03-19 MED ORDER — METOPROLOL TARTRATE 5 MG/5ML IV SOLN
5.0000 mg | Freq: Once | INTRAVENOUS | Status: AC
  Administered 2018-03-19: 5 mg via INTRAVENOUS
  Filled 2018-03-19: qty 5

## 2018-03-19 MED ORDER — SODIUM CHLORIDE 0.9 % IV SOLN
INTRAVENOUS | Status: DC | PRN
  Administered 2018-03-19: 22:00:00 via INTRAVENOUS

## 2018-03-19 MED ORDER — POTASSIUM CHLORIDE IN NACL 20-0.9 MEQ/L-% IV SOLN
INTRAVENOUS | Status: AC
  Administered 2018-03-19: via INTRAVENOUS
  Filled 2018-03-19: qty 1000

## 2018-03-19 NOTE — ED Triage Notes (Signed)
Pt C/O heart palpitations that started around [place]. Pt states she was saw for the same last Thursday and was told she was having some PVCs. Pt states she saw her PCP and made some dietary adjustments. Pt states her HR has been as high as 180 today. Pt says chest feels funny.

## 2018-03-19 NOTE — ED Provider Notes (Signed)
[facility] EMERGENCY DEPARTMENT Provider Note   CSN: [id number] Arrival date & time: 03/19/18  2039     History   Chief Complaint Chief Complaint  Patient presents with  . Palpitations    HPI Shelly [name] is a 57 y.o. female.  She is presenting here with on and off palpitations that have been going on for a few weeks but more pronounced over today.  She was here last week and was told she had PVCs.  She is tried to limit her caffeine but that does not seem to have made any difference.  Today she was noticing her heart rates in the 170s 180s and so she decided to be evaluated.  It makes her feel lightheaded and feel funny in the chest but no syncope no specific chest pain.  She has a sister who has a history of WPW.  She is on beta-blockers and has been taking her medicines.  She states she has been using divided doses of her long-acting beta-blocker but otherwise is taking them other than not tonight's dose.  No recent illness.  The history is provided by the patient.  Palpitations   This is a new problem. The current episode started more than 1 week ago. Episode frequency: frequently. The problem has not changed since onset.Associated symptoms include near-syncope. Pertinent negatives include no fever, no chest pain, no chest pressure, no syncope, no abdominal pain, no nausea, no vomiting, no lower extremity edema and no shortness of breath. She has tried breathing exercises for the symptoms. There are no known risk factors. Her past medical history does not include heart disease.    Past Medical History:  Diagnosis Date  . Acid reflux   . Adverse effect of general anesthetic    shivering recently-had to have demerol  . Arthritis   . Carpal tunnel syndrome   . Focal nodular hyperplasia of liver surgery in 2004   partial hepactectomy  . GERD (gastroesophageal reflux disease)   . Hypothyroidism   . PONV (postoperative nausea and vomiting)   . Wears glasses     Patient Active  Problem List   Diagnosis Date Noted  . Barretts esophagus 03/25/2016  . Dysphagia 04/07/2012  . Fibromyalgia 04/07/2012  . GERD (gastroesophageal reflux disease) 04/07/2012  . Hypothyroid 04/07/2012  . Shingles 04/07/2012    Past Surgical History:  Procedure Laterality Date  . CARPAL TUNNEL RELEASE     rt  . CARPAL TUNNEL RELEASE Left 01/12/2013   Procedure: CARPAL TUNNEL RELEASE;  Surgeon: Cammie Sickle., MD;  Location: [place];  Service: Orthopedics;  Laterality: Left;  . CARPOMETACARPEL SUSPENSION PLASTY Left 07/24/2014   Procedure: LEFT THUMB CARPOMETACARPEL ([place]) ARTHROPLASTY GARCIA ELIAS ;  Surgeon: Leanora Cover, MD;  Location: [place];  Service: Orthopedics;  Laterality: Left;  . CHOLECYSTECTOMY  2004   liver resection benign tumor with GB  . COLONOSCOPY  03/05/2011   Procedure: COLONOSCOPY;  Surgeon: Rogene Houston, MD;  [facility];  Service: Endoscopy;  Laterality: N/A;  . DORSAL COMPARTMENT RELEASE Left 01/12/2013   Procedure: RELEASE DORSAL COMPARTMENT (DEQUERVAIN);  Surgeon: Cammie Sickle., MD;  Location: [facility];  Service: Orthopedics;  Laterality: Left;  . ESOPHAGOGASTRODUODENOSCOPY  03/05/2011   Procedure: ESOPHAGOGASTRODUODENOSCOPY (EGD);  Surgeon: Rogene Houston, MD;  [facility];  Service: Endoscopy;  Laterality: N/A;  . ESOPHAGOGASTRODUODENOSCOPY N/A 05/14/2016   Procedure: ESOPHAGOGASTRODUODENOSCOPY (EGD);  Surgeon: Rogene Houston, MD;  [facility];  Service: Endoscopy;  Laterality: N/A;  300  . FRACTURE SURGERY  2011   rt ankle  . PARTIAL HYSTERECTOMY    . STERIOD INJECTION Left 01/12/2013   Procedure: INJECTION LEFT THUMB [place] JOINT;  Surgeon: Cammie Sickle., MD;  Location: [place];  Service: Orthopedics;  Laterality: Left;  . THYROIDECTOMY, PARTIAL    . TIBIA HARDWARE REMOVAL  2011   rt ankle   . TRIGGER FINGER RELEASE Left 01/12/2013    Procedure: RELEASE TRIGGER FINGER/A-1 PULLEY;  Surgeon: Cammie Sickle., MD;  Location: [place];  Service: Orthopedics;  Laterality: Left;     OB History   None      Home Medications    Prior to Admission medications   Medication Sig Start Date End Date Taking? Authorizing Provider  aspirin EC 81 MG tablet Take 324 mg by mouth once.   Yes [provider]  esomeprazole (NEXIUM) 20 MG capsule Take 2 capsules (40 mg total) by mouth daily before breakfast. Patient taking differently: Take 20 mg by mouth every morning.  02/21/13  Yes Carmin Muskrat, MD  metoprolol succinate (TOPROL-XL) 25 MG 24 hr tablet Take 12.5 mg by mouth 2 (two) times daily.  04/09/17  Yes [provider]  Secukinumab (COSENTYX SENSOREADY 300 DOSE) 150 MG/ML SOAJ Take 300 mg by mouth every 30 (thirty) days. Takes injection once a month 06/07/17  Yes [provider]  thyroid (ARMOUR) 60 MG tablet Take 60 mg by mouth every morning.    Yes [provider]  Topiramate ER (TROKENDI XR) 50 MG CP24 Take 50 mg by mouth every evening.  06/16/17  Yes [provider]    Family History Family History  Problem Relation Age of Onset  . Anesthesia problems Neg Hx   . Hypotension Neg Hx   . Malignant hyperthermia Neg Hx   . Pseudochol deficiency Neg Hx     Social History Social History   Tobacco Use  . Smoking status: Former Smoker    Packs/day: 0.25    Years: 0.50    Pack years: 0.12    Types: Cigarettes    Last attempt to quit: 07/19/2013    Years since quitting: 4.6  . Smokeless tobacco: Never Used  Substance Use Topics  . Alcohol use: Yes    Comment: occ  . Drug use: No     Allergies   Prednisone; Ciprofloxacin; Dairy aid [lactase]; Eggs or egg-derived products; Erythromycin; Nsaids; Other; Sulfa antibiotics; and Wheat bran   Review of Systems Review of Systems  Constitutional: Negative for fever.  HENT: Negative for sore throat.   Eyes:  Negative for visual disturbance.  Respiratory: Negative for shortness of breath.   Cardiovascular: Positive for palpitations and near-syncope. Negative for chest pain and syncope.  Gastrointestinal: Negative for abdominal pain, nausea and vomiting.  Genitourinary: Negative for dysuria.  Musculoskeletal: Negative for neck pain.  Skin: Negative for rash.  Neurological: Positive for light-headedness. Negative for seizures and syncope.     Physical Exam Updated Vital Signs BP 117/81   Pulse (!) 160   Resp 11   SpO2 99%   Physical Exam  Constitutional: She appears well-developed and well-nourished. No distress.  HENT:  Head: Normocephalic and atraumatic.  Eyes: Conjunctivae are normal.  Neck: Neck supple.  Cardiovascular: Normal rate, regular rhythm, normal heart sounds and intact distal pulses.  No murmur heard. Pulmonary/Chest: Effort normal and breath sounds normal. No respiratory distress.  Abdominal: Soft. There is no tenderness.  Musculoskeletal: Normal range of motion. She exhibits no edema, tenderness or deformity.  Neurological: She is alert.  Skin: Skin is warm and dry.  Psychiatric: She has a normal mood and affect.  Nursing note and vitals reviewed.    ED Treatments / Results  Labs (all labs ordered are listed, but only abnormal results are displayed) Labs Reviewed  BASIC METABOLIC PANEL - Abnormal; Notable for the following components:      Result Value   Potassium 3.3 (*)    Glucose, Bld 200 (*)    Calcium 8.8 (*)    All other components within normal limits  CBC  TSH  MAGNESIUM  I-STAT TROPONIN, ED  I-STAT BETA HCG BLOOD, ED ([facility], [facility], [facility] ONLY)    EKG EKG Interpretation  Date/Time:  Saturday March 19 2018 20:51:20 EDT Ventricular Rate:  173 PR Interval:    QRS Duration: 86 QT Interval:  298 QTC Calculation: 506 R Axis:   67 Text Interpretation:  Supraventricular tachycardia Repolarization abnormality, prob rate related rate and rhythm change  compared with earlier Confirmed by Aletta Edouard [phone]) on 03/19/2018 9:00:03 PM   Radiology No results found.  Procedures .Critical Care Performed by: Hayden Rasmussen, MD Authorized by: Hayden Rasmussen, MD   Critical care provider statement:    Critical care time (minutes):  30   Critical care time was exclusive of:  Separately billable procedures and treating other patients   Critical care was necessary to treat or prevent imminent or life-threatening deterioration of the following conditions:  Cardiac failure and circulatory failure   Critical care was time spent personally by me on the following activities:  Discussions with consultants, development of treatment plan with patient or surrogate, discussions with primary provider, evaluation of patient's response to treatment, examination of patient, obtaining history from patient or surrogate, ordering and performing treatments and interventions, ordering and review of laboratory studies, pulse oximetry, re-evaluation of patient's condition and review of old charts   I assumed direction of critical care for this patient from another provider in my specialty: no     (including critical care time)  Medications Ordered in ED Medications  potassium chloride 10 mEq in 100 mL IVPB (10 mEq Intravenous New Bag/Given 03/19/18 2152)  0.9 %  sodium chloride infusion ( Intravenous New Bag/Given 03/19/18 2152)  magnesium sulfate IVPB 2 g 50 mL (has no administration in time range)  potassium chloride SA (K-DUR,KLOR-CON) CR tablet 40 mEq (has no administration in time range)     Initial Impression / Assessment and Plan / ED Course  I have reviewed the triage vital signs and the nursing notes.  Pertinent labs & imaging results that were available during my care of the patient were reviewed by me and considered in my medical decision making (see chart for details).  Clinical Course as of Mar 20 1053  Sat Mar 19, 2018  2123 Discussed with Dr.  Rhae Hammock from cardiology.  Is recommend edition that the patient be observed overnight on a cardiac monitor.  Likely would just be increasing her beta-blocker.  If the patient needed to be transferred down to come because of lack of cardiology coverage then he still recommends a cardiology be the admitting service.   [MB]  2124 Recommended that the patient had a prolonged episode and needed to be intervened on for her tachycardia that he would still use adenosine.   [MB]  2210 Discussed with Dr. Olevia Bowens from the hospitalist  service who will evaluate the patient but likely send her to [facility].  He said he would take care of any further medications for her.   [MB]  2215 Intermittently the patient is having acute bursts up to 170 narrow complex at last about a minute.  She is mildly symptomatic with that but it improves on its own.   [MB]    Clinical Course User Index [MB] Hayden Rasmussen, MD     Final Clinical Impressions(s) / ED Diagnoses   Final diagnoses:  Paroxysmal SVT (supraventricular tachycardia) ([place])  Hypokalemia    ED Discharge Orders    None       Hayden Rasmussen, MD 03/20/18 1055

## 2018-03-19 NOTE — Treatment Plan (Signed)
I was called about this patient by the ED physician. She presents with palpitations. She was noted to have a run of SVT that was captured on ECG. Her symptoms resolved spontaneously.   I recommended discussion of admission with hospitalist and overnight monitoring on telemetry. Her metoprolol can likely be increased to see if this helps control her arrhythmia. Cardiology is available for assistance or further recommendations as needed.

## 2018-03-19 NOTE — H&P (Signed)
History and Physical    Shelly [name] XBJ:[id number] DOB: 05/23/1961 DOA: 03/19/2018  PCP: Curly Rim, MD   Patient coming from: Home.  I have personally briefly reviewed patient's old medical records in [place]  Chief Complaint: Palpitations.  HPI: Shelly [name] is a 57 y.o. 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  On 03/10/2018, her work-up was reassuring and was discharged home.  However, she mentions that she has continued having palpitations, which started about 2 months ago.  She has stopped caffeine consumption, but has not noticed any changes.  She is currently on low-dose metoprolol, but this has not stopped her palpitations.  She mentions that she has this history who has a history of WPW.  Today, palpitations were very intense and she noticed that her heart rate was in the 170s to 180s at home so she decided to return to the ED.  Associated symptoms are mild lightheadedness, but denies chest pain, dyspnea, nausea, diaphoresis, PND orthopnea.  She occasionally gets mild pitting edema of the ankles.  She denies abdominal pain, nausea, emesis, diarrhea, constipation, melena or hematochezia.  Denies dysuria, frequency or hematuria.  No polyuria, polydipsia, polyphagia or blurred vision.  ED Course: Initial vital signs temperature 97.7, pulse 160, respirations 11, blood pressure 117/81 mmHg and O2 sat 99% on room air.  The patient was given 40 mEq of KCl in the ED.  I added and normal saline plus KCl 20 mEq 1000 mL over 4-hour bolus, metoprolol 5 mg IVP x1 and magnesium sulfate 2 g IVPB.  Her EKG showed SVT at 173 bpm. Her lab work shows an normal troponin. BNP of 52.0 pg/mL.Marland Kitchen  White count 7.5, hemoglobin 12.9 g/dL and platelets 145.  Sodium 139, potassium  3.3, chloride 108 and CO2 25 mmol/L.  BUN was 14, creatinine 0.8, glucose 200, calcium 8.8 and magnesium 1.8 mg/dL.  H was 1.001 uU/L.  Last week's that did not show any active cardiopulmonary disease.  Review of Systems: As per HPI otherwise 10 point review of systems negative.   Past Medical History:  Diagnosis Date  . Acid reflux   . Adverse effect of general anesthetic    shivering recently-had to have demerol  . Arthritis   . Carpal tunnel syndrome   . Focal nodular hyperplasia of liver surgery in 2004   partial hepactectomy  . GERD (gastroesophageal reflux disease)   . Hypothyroidism   . PONV (postoperative nausea and vomiting)   . Wears glasses     Past Surgical History:  Procedure Laterality Date  . CARPAL TUNNEL RELEASE     rt  . CARPAL TUNNEL RELEASE Left 01/12/2013   Procedure: CARPAL TUNNEL RELEASE;  Surgeon: Cammie Sickle., MD;  Location: [place];  Service: Orthopedics;  Laterality: Left;  . CARPOMETACARPEL SUSPENSION PLASTY Left 07/24/2014   Procedure: LEFT THUMB CARPOMETACARPEL ([place]) ARTHROPLASTY GARCIA ELIAS ;  Surgeon: Leanora Cover, MD;  Location: [place];  Service: Orthopedics;  Laterality: Left;  . CHOLECYSTECTOMY  2004   liver resection benign tumor with GB  . COLONOSCOPY  03/05/2011   Procedure: COLONOSCOPY;  Surgeon: Rogene Houston, MD;  [facility];  Service: Endoscopy;  Laterality: N/A;  . DORSAL COMPARTMENT RELEASE Left 01/12/2013   Procedure: RELEASE DORSAL COMPARTMENT (  DEQUERVAIN);  Surgeon: Cammie Sickle., MD;  Location: [facility];  Service: Orthopedics;  Laterality: Left;  . ESOPHAGOGASTRODUODENOSCOPY  03/05/2011   Procedure: ESOPHAGOGASTRODUODENOSCOPY (EGD);  Surgeon: Rogene Houston, MD;  [facility];  Service: Endoscopy;  Laterality: N/A;  . ESOPHAGOGASTRODUODENOSCOPY N/A 05/14/2016   Procedure: ESOPHAGOGASTRODUODENOSCOPY (EGD);  Surgeon: Rogene Houston, MD;  [facility];  Service: Endoscopy;  Laterality: N/A;  300  . FRACTURE SURGERY  2011   rt ankle  . PARTIAL HYSTERECTOMY    . STERIOD INJECTION Left 01/12/2013   Procedure: INJECTION LEFT THUMB [place] JOINT;  Surgeon: Cammie Sickle., MD;  Location: [place];  Service: Orthopedics;  Laterality: Left;  . THYROIDECTOMY, PARTIAL    . TIBIA HARDWARE REMOVAL  2011   rt ankle   . TRIGGER FINGER RELEASE Left 01/12/2013   Procedure: RELEASE TRIGGER FINGER/A-1 PULLEY;  Surgeon: Cammie Sickle., MD;  Location: [place];  Service: Orthopedics;  Laterality: Left;     reports that she quit smoking about 4 years ago. Her smoking use included cigarettes. She has a 0.12 pack-year smoking history. She has never used smokeless tobacco. She reports that she drinks alcohol. She reports that she does not use drugs.  Allergies  Allergen Reactions  . Prednisone Other (See Comments) and Anxiety    Other reaction(s): Nervous manic   . Ciprofloxacin     Patient states that it was neuro pain , could hardly walk,.  . Dairy Aid [Lactase]   . Eggs Or Egg-Derived Products   . Erythromycin     Unsure of whether this is an allergy per patient   . Nsaids Other (See Comments)    Causes Gastric Problems   . Other     gluten  . Sulfa Antibiotics Hives  . Wheat Bran    Family History  Problem Relation Age of Onset  . Diabetes Mother   . Cirrhosis Father   . Alcohol abuse Brother   . Cirrhosis Brother   . Anesthesia problems Neg Hx   . Hypotension Neg Hx   . Malignant hyperthermia Neg Hx   . Pseudochol deficiency Neg Hx     Prior to Admission medications   Medication Sig Start Date End Date Taking? Authorizing Provider  aspirin EC 81 MG tablet Take 324 mg by mouth once.   Yes [provider]  esomeprazole (NEXIUM) 20 MG capsule Take 2 capsules (40 mg total) by mouth daily before breakfast. Patient taking differently: Take 20 mg by mouth every morning.  02/21/13   Yes Carmin Muskrat, MD  metoprolol succinate (TOPROL-XL) 25 MG 24 hr tablet Take 12.5 mg by mouth 2 (two) times daily.  04/09/17  Yes [provider]  Secukinumab (COSENTYX SENSOREADY 300 DOSE) 150 MG/ML SOAJ Take 300 mg by mouth every 30 (thirty) days. Takes injection once a month 06/07/17  Yes [provider]  thyroid (ARMOUR) 60 MG tablet Take 60 mg by mouth every morning.    Yes [provider]  Topiramate ER (TROKENDI XR) 50 MG CP24 Take 50 mg by mouth every evening.  06/16/17  Yes [provider]    Physical Exam: Vitals:   03/19/18 2200 03/19/18 2230 03/19/18 2300  BP: 117/81 112/72 123/73  Pulse: (!) 160 63 (!) 57  Resp: 11 13 11   SpO2: 99% 99% 99%    Constitutional: NAD, calm, comfortable Eyes: PERRL, lids and conjunctivae normal ENMT: Mucous membranes are  moist. Posterior pharynx clear of any exudate or lesions. Neck: normal, supple, no masses, no thyromegaly Respiratory: clear to auscultation bilaterally, no wheezing, no crackles. Normal respiratory effort. No accessory muscle use.  Cardiovascular: Regular rate and rhythm, no murmurs / rubs / gallops. No extremity edema. 2+ pedal pulses. No carotid bruits.  Abdomen: Obese, soft, no tenderness, no masses palpated. No hepatosplenomegaly. Bowel sounds positive.  Musculoskeletal: no clubbing / cyanosis. No gross joint deformity upper and lower extremities. Good ROM, no contractures. Normal muscle tone.  Skin: no rashes, lesions, ulcers on very limited dermatological examination Neurologic: CN 2-12 grossly intact. Sensation intact, DTR normal. Strength 5/5 in all 4.  Psychiatric: Normal judgment and insight. Alert and oriented x 3. Normal mood.    Labs on Admission: I have personally reviewed following labs and imaging studies  CBC: Recent Labs  Lab 03/19/18 2053  WBC 7.5  HGB 12.9  HCT 39.4  MCV 90.4  PLT 706   Basic Metabolic Panel: Recent Labs  Lab 03/19/18 2053  NA 139  K  3.3*  CL 108  CO2 25  GLUCOSE 200*  BUN 14  CREATININE 0.80  CALCIUM 8.8*  MG 1.8   GFR: Estimated Creatinine Clearance: 91.4 mL/min (by C-G formula based on SCr of 0.8 mg/dL). Liver Function Tests: No results for input(s): AST, ALT, ALKPHOS, BILITOT, PROT, ALBUMIN in the last 168 hours. No results for input(s): LIPASE, AMYLASE in the last 168 hours. No results for input(s): AMMONIA in the last 168 hours. Coagulation Profile: No results for input(s): INR, PROTIME in the last 168 hours. Cardiac Enzymes: No results for input(s): CKTOTAL, CKMB, CKMBINDEX, TROPONINI in the last 168 hours. BNP (last 3 results) No results for input(s): PROBNP in the last 8760 hours. HbA1C: No results for input(s): HGBA1C in the last 72 hours. CBG: No results for input(s): GLUCAP in the last 168 hours. Lipid Profile: No results for input(s): CHOL, HDL, LDLCALC, TRIG, CHOLHDL, LDLDIRECT in the last 72 hours. Thyroid Function Tests: Recent Labs    03/19/18 2053  TSH 1.001   Anemia Panel: No results for input(s): VITAMINB12, FOLATE, FERRITIN, TIBC, IRON, RETICCTPCT in the last 72 hours. Urine analysis:    Component Value Date/Time   COLORURINE YELLOW 02/18/2014 1546   APPEARANCEUR CLEAR 02/18/2014 1546   LABSPEC <1.005 (L) 02/18/2014 1546   PHURINE 6.0 02/18/2014 1546   GLUCOSEU NEGATIVE 02/18/2014 1546   HGBUR NEGATIVE 02/18/2014 1546   BILIRUBINUR NEGATIVE 02/18/2014 1546   KETONESUR NEGATIVE 02/18/2014 1546   PROTEINUR NEGATIVE 02/18/2014 1546   UROBILINOGEN 0.2 02/18/2014 1546   NITRITE NEGATIVE 02/18/2014 1546   LEUKOCYTESUR NEGATIVE 02/18/2014 1546    Radiological Exams on Admission: No results found.  EKG: Independently reviewed.  Vent. rate 173 BPM PR interval * ms QRS duration 86 ms QT/QTc 298/506 ms P-R-T axes -6 67 267 Supraventricular tachycardia Repolarization abnormality, prob rate related rate and rhythm change compared with earlier  Assessment/Plan Principal  Problem:   SVT (supraventricular tachycardia) ([place]) Observation/telemetry at [facility]. Supplemental oxygen. Continue electrolyte supplementation. Increase metoprolol tartrate from 12.5 p.o. twice daily to 50 mg succinate form. Trend troponin level. Check echocardiogram. Cardiology consult at [facility].  Active Problems:   GERD (gastroesophageal reflux disease) Protonix 40 mg p.o. daily.    Hypothyroidism Continue Armour Thyroid 60 mg p.o. daily.    Hypokalemia Corrected. Magnesium was supplemented as well. Will start the patient on daily potassium supplement.    Hyperglycemia Continue IV fluids. Carbohydrate modified diet. Check hemoglobin A1c.  DVT prophylaxis: Lovenox SQ. Code Status: Full code. Family Communication: Her spouse was in the ED. Disposition Plan: Observation [facility] hospital which has echocardiogram and cardiology service is available over the weekend. Consults called: Dr. Nila Nephew (cardiology service) was contacted by Dr. Melina Copa from the ED earlier. Admission status: Observation/telemetry.   Reubin Milan MD [facility] Pager [phone].  If 7PM-7AM, please contact night-coverage [email or web address] Password [facility]  03/19/2018, 11:36 PM

## 2018-03-20 ENCOUNTER — Other Ambulatory Visit (HOSPITAL_COMMUNITY): Payer: BLUE CROSS/BLUE SHIELD

## 2018-03-20 ENCOUNTER — Encounter (HOSPITAL_COMMUNITY): Payer: Self-pay | Admitting: Internal Medicine

## 2018-03-20 DIAGNOSIS — R002 Palpitations: Secondary | ICD-10-CM | POA: Diagnosis not present

## 2018-03-20 DIAGNOSIS — E876 Hypokalemia: Secondary | ICD-10-CM

## 2018-03-20 DIAGNOSIS — Z87891 Personal history of nicotine dependence: Secondary | ICD-10-CM | POA: Diagnosis not present

## 2018-03-20 DIAGNOSIS — K219 Gastro-esophageal reflux disease without esophagitis: Secondary | ICD-10-CM | POA: Diagnosis not present

## 2018-03-20 DIAGNOSIS — I471 Supraventricular tachycardia: Secondary | ICD-10-CM

## 2018-03-20 DIAGNOSIS — E039 Hypothyroidism, unspecified: Secondary | ICD-10-CM | POA: Diagnosis not present

## 2018-03-20 DIAGNOSIS — R739 Hyperglycemia, unspecified: Secondary | ICD-10-CM | POA: Diagnosis not present

## 2018-03-20 DIAGNOSIS — Z79899 Other long term (current) drug therapy: Secondary | ICD-10-CM | POA: Diagnosis not present

## 2018-03-20 DIAGNOSIS — R0602 Shortness of breath: Secondary | ICD-10-CM | POA: Diagnosis not present

## 2018-03-20 DIAGNOSIS — Z7982 Long term (current) use of aspirin: Secondary | ICD-10-CM | POA: Diagnosis not present

## 2018-03-20 LAB — BASIC METABOLIC PANEL
ANION GAP: 7 (ref 5–15)
BUN: 10 mg/dL (ref 6–20)
CALCIUM: 8.4 mg/dL — AB (ref 8.9–10.3)
CO2: 21 mmol/L — ABNORMAL LOW (ref 22–32)
CREATININE: 0.63 mg/dL (ref 0.44–1.00)
Chloride: 113 mmol/L — ABNORMAL HIGH (ref 98–111)
Glucose, Bld: 154 mg/dL — ABNORMAL HIGH (ref 70–99)
Potassium: 4.7 mmol/L (ref 3.5–5.1)
SODIUM: 141 mmol/L (ref 135–145)

## 2018-03-20 LAB — HEMOGLOBIN A1C
HEMOGLOBIN A1C: 6.7 % — AB (ref 4.8–5.6)
MEAN PLASMA GLUCOSE: 145.59 mg/dL

## 2018-03-20 LAB — TROPONIN I
Troponin I: <0.03 ng/mL (ref <0.03)
Troponin I: <0.03 ng/mL (ref <0.03)

## 2018-03-20 MED ORDER — TOPIRAMATE 25 MG PO TABS
50.0000 mg | ORAL_TABLET | Freq: Every evening | ORAL | Status: DC
  Administered 2018-03-20: 50 mg via ORAL
  Filled 2018-03-20 (×2): qty 2

## 2018-03-20 MED ORDER — ONDANSETRON HCL 4 MG/2ML IJ SOLN: 4.0000 mg | Freq: Four times a day (QID) | INTRAMUSCULAR | Status: DC | PRN

## 2018-03-20 MED ORDER — ONDANSETRON HCL 4 MG PO TABS: 4.0000 mg | ORAL_TABLET | Freq: Four times a day (QID) | ORAL | Status: DC | PRN

## 2018-03-20 MED ORDER — ACETAMINOPHEN 325 MG PO TABS: 650.0000 mg | ORAL_TABLET | Freq: Four times a day (QID) | ORAL | Status: DC | PRN

## 2018-03-20 MED ORDER — ZOLPIDEM TARTRATE 5 MG PO TABS: 5.0000 mg | ORAL_TABLET | Freq: Every evening | ORAL | Status: DC | PRN

## 2018-03-20 MED ORDER — METOPROLOL SUCCINATE ER 50 MG PO TB24
50.0000 mg | ORAL_TABLET | Freq: Every day | ORAL | Status: DC
  Filled 2018-03-20: qty 1

## 2018-03-20 MED ORDER — PANTOPRAZOLE SODIUM 40 MG PO TBEC
40.0000 mg | DELAYED_RELEASE_TABLET | Freq: Every day | ORAL | Status: DC
  Administered 2018-03-20: 40 mg via ORAL
  Filled 2018-03-20: qty 1

## 2018-03-20 MED ORDER — ENOXAPARIN SODIUM 40 MG/0.4ML ~~LOC~~ SOLN
40.0000 mg | Freq: Every day | SUBCUTANEOUS | Status: DC
  Filled 2018-03-20: qty 0.4

## 2018-03-20 MED ORDER — THYROID 60 MG PO TABS
60.0000 mg | ORAL_TABLET | Freq: Every morning | ORAL | Status: DC
  Administered 2018-03-20 – 2018-03-21 (×2): 60 mg via ORAL
  Filled 2018-03-20 (×2): qty 1

## 2018-03-20 MED ORDER — ACETAMINOPHEN 650 MG RE SUPP: 650.0000 mg | Freq: Four times a day (QID) | RECTAL | Status: DC | PRN

## 2018-03-20 NOTE — Consult Note (Signed)
Cardiology Consultation:   Patient ID: Shelly [name]; [id number]; Aug 17, 1961   Admit date: 03/19/2018 Date of Consult: 03/20/2018  Primary Care Provider: Curly Rim, MD Primary Cardiologist: None   Patient Profile:   Shelly [name] is a 57 y.o. female with a hx of hypothyroidism, GERD who is being seen today for the evaluation of SVT at the request of Dr. Olevia Bowens.  History of Present Illness:   Shelly [name] is a 57 y.o. female with a hx of hypothyroidism, GERD who is being seen today for the evaluation of SVT.  The patient has no known cardiac history except for intermittent palpitations. She was evaluated by cardiology with [facility] in 05/2016 and underwent Holter that showed occasional PVCs; echocardiogram was ordered but not performed. She has been treated with metoprolol.  She reports that her palpitations had resolved until several weeks ago, when she developed recurrent symptoms. She states that these episodes occur suddenly without known trigger, and she is unaware of any alleviating factors. These palpitations are associated with chest discomfort and lightheadedness without syncope or dyspnea. She has eliminated caffeine without improvement of her symptoms.   She presented to the ED last month and had unremarkable workup. Her symptoms reoccurred and she presented back to the [facility] ED this evening with recurrent palpitations. ECG was obtained and showed SVT with HR 170s and nonspecific ST changes. This tachyarrhythmia aborted spontaneously. She subsequently had several additional episodes. Her labs were notable for K 3.3, normal renal function, TSH wnl. Troponin was negative x1. She was transferred to [facility] hospitalist service, and cardiology was consulted for further management.  Past Medical History:  Diagnosis Date  . Acid reflux   . Adverse effect of general anesthetic    shivering recently-had to have demerol  . Arthritis   . Carpal tunnel syndrome     . Focal nodular hyperplasia of liver surgery in 2004   partial hepactectomy  . GERD (gastroesophageal reflux disease)   . Hypothyroidism   . PONV (postoperative nausea and vomiting)   . Wears glasses     Past Surgical History:  Procedure Laterality Date  . CARPAL TUNNEL RELEASE     rt  . CARPAL TUNNEL RELEASE Left 01/12/2013   Procedure: CARPAL TUNNEL RELEASE;  Surgeon: Cammie Sickle., MD;  Location: [place];  Service: Orthopedics;  Laterality: Left;  . CARPOMETACARPEL SUSPENSION PLASTY Left 07/24/2014   Procedure: LEFT THUMB CARPOMETACARPEL ([place] ARTHROPLASTY GARCIA ELIAS ;  Surgeon: Leanora Cover, MD;  Location: [place];  Service: Orthopedics;  Laterality: Left;  . CHOLECYSTECTOMY  2004   liver resection benign tumor with GB  . COLONOSCOPY  03/05/2011   Procedure: COLONOSCOPY;  Surgeon: Rogene Houston, MD;  [facility];  Service: Endoscopy;  Laterality: N/A;  . DORSAL COMPARTMENT RELEASE Left 01/12/2013   Procedure: RELEASE DORSAL COMPARTMENT ([place]);  Surgeon: Cammie Sickle., MD;  Location: [facility];  Service: Orthopedics;  Laterality: Left;  . ESOPHAGOGASTRODUODENOSCOPY  03/05/2011   Procedure: ESOPHAGOGASTRODUODENOSCOPY (EGD);  Surgeon: Rogene Houston, MD;  [facility];  Service: Endoscopy;  Laterality: N/A;  . ESOPHAGOGASTRODUODENOSCOPY N/A 05/14/2016   Procedure: ESOPHAGOGASTRODUODENOSCOPY (EGD);  Surgeon: Rogene Houston, MD;  [facility];  Service: Endoscopy;  Laterality: N/A;  300  . FRACTURE SURGERY  2011   rt ankle  . PARTIAL HYSTERECTOMY    . STERIOD INJECTION Left 01/12/2013   Procedure: INJECTION LEFT  THUMB [place] JOINT;  Surgeon: Cammie Sickle., MD;  Location: [place];  Service: Orthopedics;  Laterality: Left;  . THYROIDECTOMY, PARTIAL    . TIBIA HARDWARE REMOVAL  2011   rt ankle   . TRIGGER FINGER RELEASE Left 01/12/2013   Procedure: RELEASE TRIGGER  FINGER/A-1 PULLEY;  Surgeon: Cammie Sickle., MD;  Location: [place];  Service: Orthopedics;  Laterality: Left;     Home Medications:  Prior to Admission medications   Medication Sig Start Date End Date Taking? Authorizing Provider  aspirin EC 81 MG tablet Take 324 mg by mouth once.   Yes [provider]  esomeprazole (NEXIUM) 20 MG capsule Take 2 capsules (40 mg total) by mouth daily before breakfast. Patient taking differently: Take 20 mg by mouth every morning.  02/21/13  Yes Carmin Muskrat, MD  metoprolol succinate (TOPROL-XL) 25 MG 24 hr tablet Take 12.5 mg by mouth 2 (two) times daily.  04/09/17  Yes [provider]  Secukinumab (COSENTYX SENSOREADY 300 DOSE) 150 MG/ML SOAJ Take 300 mg by mouth every 30 (thirty) days. Takes injection once a month 06/07/17  Yes [provider]  thyroid (ARMOUR) 60 MG tablet Take 60 mg by mouth every morning.    Yes [provider]  Topiramate ER (TROKENDI XR) 50 MG CP24 Take 50 mg by mouth every evening.  06/16/17  Yes [provider]    Inpatient Medications: Scheduled Meds: . enoxaparin (LOVENOX) injection  40 mg Subcutaneous Daily  . metoprolol succinate  50 mg Oral Daily  . pantoprazole  40 mg Oral Daily  . thyroid  60 mg Oral q morning - 10a  . topiramate  50 mg Oral QPM   Continuous Infusions: . sodium chloride 10 mL/hr at 03/19/18 2152   PRN Meds: sodium chloride, acetaminophen **OR** acetaminophen, ondansetron **OR** ondansetron (ZOFRAN) IV, zolpidem  Allergies:    Allergies  Allergen Reactions  . Prednisone Other (See Comments) and Anxiety    Other reaction(s): Nervous manic   . Ciprofloxacin     Patient states that it was neuro pain , could hardly walk,.  . Dairy Aid [Lactase]   . Eggs Or Egg-Derived Products   . Erythromycin     Unsure of whether this is an allergy per patient   . Nsaids Other (See Comments)    Causes Gastric Problems   . Other     gluten    . Sulfa Antibiotics Hives  . Wheat Bran     Social History:   Social History   Socioeconomic History  . Marital status: Married    Spouse name: Not on file  . Number of children: Not on file  . Years of education: Not on file  . Highest education level: Not on file  Occupational History  . Not on file  Social Needs  . Financial resource strain: Not on file  . Food insecurity:    Worry: Not on file    Inability: Not on file  . Transportation needs:    Medical: Not on file    Non-medical: Not on file  Tobacco Use  . Smoking status: Former Smoker    Packs/day: 0.25    Years: 0.50    Pack years: 0.12    Types: Cigarettes    Last attempt to quit: 07/19/2013    Years since quitting: 4.6  . Smokeless tobacco: Never Used  Substance and Sexual Activity  . Alcohol use: Yes    Comment: occ  .  Drug use: No  . Sexual activity: Yes    Comment: has a rare cigarette  Lifestyle  . Physical activity:    Days per week: Not on file    Minutes per session: Not on file  . Stress: Not on file  Relationships  . Social connections:    Talks on phone: Not on file    Gets together: Not on file    Attends religious service: Not on file    Active member of club or organization: Not on file    Attends meetings of clubs or organizations: Not on file    Relationship status: Not on file  . Intimate partner violence:    Fear of current or ex partner: Not on file    Emotionally abused: Not on file    Physically abused: Not on file    Forced sexual activity: Not on file  Other Topics Concern  . Not on file  Social History Narrative  . Not on file    Family History:    Family History  Problem Relation Age of Onset  . Diabetes Mother   . Cirrhosis Father   . Alcohol abuse Brother   . Cirrhosis Brother   . Anesthesia problems Neg Hx   . Hypotension Neg Hx   . Malignant hyperthermia Neg Hx   . Pseudochol deficiency Neg Hx      ROS:  Please see the history of present illness.   All other ROS reviewed and negative.     Physical Exam/Data:   Vitals:   03/19/18 2330 03/20/18 0000 03/20/18 0030 03/20/18 0142  BP: 112/68 111/68 117/65 (!) 134/104  Pulse: 61 70 (!) 58 (!) 54  Resp: 11 15 17    Temp:    97.7 F (36.5 C)  TempSrc:    Oral  SpO2: 96% 97% 98% 100%    General:  Well nourished, well developed, in no acute distress HEENT: normal Lymph: no adenopathy Neck: no JVD Cardiac:  normal S1, S2; RRR; no murmur  Lungs:  clear to auscultation bilaterally, no wheezing, rhonchi or rales  Abd: obese, soft, nontender Ext: no edema Musculoskeletal:  No deformities, BUE and BLE strength normal and equal Skin: warm and dry  Neuro:  No focal abnormalities noted Psych:  Normal affect   EKG:  The EKG was personally reviewed and demonstrates: Narrow complex, regular tachycardia, consistent with SVT, probably short R-P interval. Nonspecific ST changes.   Telemetry:  Telemetry was personally reviewed and demonstrates:  multiple episodes of SVT initiated by PACs with some ventricular aberrency with short R-P interval  Relevant CV Studies: Holter 06/2016 ([facility]where) Patient monitored for 48 hours. Analysis and rhythm strips reveals sinus rhythm at a rate of 82 bpm. Minimum heart rate 54 bpm. Maximum heart rate 135 bpm, sinus tachycardia. No atrial fibrillation. Occasional premature ventricular contractions. No complex ventricular ectopy. No significant atrial ectopy. There were no pauses greater than 3 seconds in duration.    Laboratory Data:  Chemistry Recent Labs  Lab 03/19/18 2053  NA 139  K 3.3*  CL 108  CO2 25  GLUCOSE 200*  BUN 14  CREATININE 0.80  CALCIUM 8.8*  GFRNONAA >60  GFRAA >60  ANIONGAP 6    No results for input(s): PROT, ALBUMIN, AST, ALT, ALKPHOS, BILITOT in the last 168 hours. Hematology Recent Labs  Lab 03/19/18 2053  WBC 7.5  RBC 4.36  HGB 12.9  HCT 39.4  MCV 90.4  MCH 29.6  MCHC 32.7  RDW 13.6  PLT 245   Cardiac  EnzymesNo results for input(s): TROPONINI in the last 168 hours.  Recent Labs  Lab 03/19/18 2056  TROPIPOC 0.00    BNP Recent Labs  Lab 03/19/18 2053  BNP 52.0    DDimer No results for input(s): DDIMER in the last 168 hours.  Radiology/Studies:  No results found.  Assessment and Plan:    SVT Palpitations The patient has a history of palpitations with unremarkable workup. She presented to the ED with worsening symptoms and is found to have multiple paroxysms of SVT. Review of telemetry shows (long end of) short R-P tachycardia. 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 She notably has a sister with WPW, and although her QRS is borderline prolonged, there is no definite pre-excitation. She has hypokalemia, which may be contributing to her worsening symptoms; TSH wnl. We discussed different treatment options including more aggressive medical management and consideration of ablation. -Continue to monitor on telemetry -Agree with metoprolol increase -Echocardiogram ordered   For questions or updates, please contact [place] Please consult [email or web address] for contact info under Cardiology/STEMI.   Signed, Nila Nephew, MD  03/20/2018 3:36 AM

## 2018-03-20 NOTE — Progress Notes (Signed)
Subjective:  No recurrence of SVT since last night  Objective:  Vital Signs in the last 24 hours: BP (!) 134/104 (BP Location: Left Arm)   Pulse (!) 54   Temp 97.7 F (36.5 C) (Oral)   Resp 17   SpO2 100%   Physical Exam: Obese female in no acute distress Lungs:  Clear Cardiac:  Regular rhythm, normal S1 and S2, no S3  No edema present  Intake/Output from previous day: 08/03 0701 - 08/04 0700 In: 150 [IV Piggyback:150] Out: -   Weight There were no vitals filed for this visit.  Lab Results: Basic Metabolic Panel: Recent Labs    03/19/18 2053 03/20/18 0838  NA 139 141  K 3.3* 4.7  CL 108 113*  CO2 25 21*  GLUCOSE 200* 154*  BUN 14 10  CREATININE 0.80 0.63   CBC: Recent Labs    03/19/18 2053  WBC 7.5  HGB 12.9  HCT 39.4  MCV 90.4  PLT 245   Cardiac Enzymes: Troponin (Point of Care Test) Recent Labs    03/19/18 2056  TROPIPOC 0.00   Cardiac Panel (last 3 results) Recent Labs    03/20/18 0347 03/20/18 0838  TROPONINI <0.03 <0.03    Telemetry: Sinus rhythm without recurrence of SVT  Assessment/Plan:  1.  SVT 2.  Obesity 3.  Hypertension  Recommendations: Await echocardiogram.  Would discharge on higher dose of beta-blocker and follow-up as an outpatient.  She lives in [place] and could follow-up at the [place] or the [place] office.  If she continues to have recurrent SVT despite higher doses of beta-blocker could be considered for ablation.  Kerry Hough  MD [facility] Cardiology  03/20/2018, 11:56 AM

## 2018-03-20 NOTE — Progress Notes (Signed)
PROGRESS NOTE                                                                                                                                                                                                             Patient Demographics:    Shelly [name], is a 57 y.o. female, DOB - 06-Sep-1960, HMC:[id number]  Admit date - 03/19/2018   Admitting Physician Shelly Milan, MD  Outpatient Primary MD for the patient is Corrington, [name], MD  LOS - 0   Chief Complaint  Patient presents with  . Palpitations       Brief Narrative   57 y.o. 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  On 03/10/2018, her work-up was reassuring and was discharged home.  However, she mentions that she has continued having palpitations, which started about 2 months ago.  She has stopped caffeine consumption, but has not noticed any changes.  She is currently on low-dose metoprolol, but this has not stopped her palpitations, ED her EKG showed SVT at 173, hypokalemia with potassium 3.3.     Subjective:    Lynnea Vandervoort today has, No headache, No chest pain, No abdominal pain - No Nausea, No new weakness tingling or numbness, No Cough - SOB.    Assessment  & Plan :    Principal Problem:   SVT (supraventricular tachycardia) (HCC) Active Problems:   GERD (gastroesophageal reflux disease)   Hypothyroidism   Hypokalemia   Hyperglycemia   SVT (supraventricular tachycardia) (Shelly [name]) -She does report previous episode 2 to 3 years ago, ED visit last week with similar presentation, noted to have SVT in ED. -Cardiology input greatly appreciated, recommendation to increase her metoprolol, she is on Toprol-XL 12.5 p.o. at home, was increased to 50 mg oral daily,  actually she did not receive any p.o. metoprolol so far, given she is bradycardic heart rate mainly in the 50s to 60s. -TSH within normal limits, 2D echo pending, troponins negative x3 -I have held her Toprol-XL for tomorrow morning dose, pending her heart rate overnight   GERD -Continue with PPI  Hypothyroidism - Continue Armour Thyroid 60 mg p.o. daily.  Hypokalemia - Corrected. -  Magnesium was supplemented as well.  Hyperglycemia -A1c is 6.7     Code Status : Full   Family Communication  : None at bedside  Disposition Plan  : Home  Consults  :  Cardiology  Procedures  : None  DVT Prophylaxis  : Lovenox  Lab Results  Component Value Date   PLT 245 03/19/2018    Antibiotics  :    Anti-infectives (From admission, onward)   None        Objective:   Vitals:   03/20/18 0000 03/20/18 0030 03/20/18 0142 03/20/18 1300  BP: 111/68 117/65 (!) 134/104 139/78  Pulse: 70 (!) 58 (!) 54 60  Resp: 15 17    Temp:   97.7 F (36.5 C)   TempSrc:   Oral   SpO2: 97% 98% 100% 100%    Wt Readings from Last 3 Encounters:  03/10/18 99.8 kg (220 lb)  05/14/16 99.8 kg (220 lb)  06/17/15 104.8 kg (231 lb 1.6 oz)     Intake/Output Summary (Last 24 hours) at 03/20/2018 1617 Last data filed at 03/19/2018 2346 Gross per 24 hour  Intake 150 ml  Output -  Net 150 ml     Physical Exam  Awake Alert, Oriented X 3, No new F.N deficits, Normal affect Symmetrical Chest wall movement, Good air movement bilaterally, CTAB RRR,No Gallops,Rubs or new Murmurs, No Parasternal Heave +ve B.Sounds, Abd Soft, No tenderness,  No rebound - guarding or rigidity. No Cyanosis, Clubbing or edema, No new Rash or bruise      Data Review:    CBC Recent Labs  Lab 03/19/18 2053  WBC 7.5  HGB 12.9  HCT 39.4  PLT 245  MCV 90.4  MCH 29.6  MCHC 32.7  RDW 13.6    Chemistries  Recent Labs  Lab 03/19/18 2053 03/20/18 0838  NA 139 141  K 3.3* 4.7  CL 108 113*  CO2 25 21*    GLUCOSE 200* 154*  BUN 14 10  CREATININE 0.80 0.63  CALCIUM 8.8* 8.4*  MG 1.8  --    ------------------------------------------------------------------------------------------------------------------ No results for input(s): CHOL, HDL, LDLCALC, TRIG, CHOLHDL, LDLDIRECT in the last 72 hours.  Lab Results  Component Value Date   HGBA1C 6.7 (H) 03/20/2018   ------------------------------------------------------------------------------------------------------------------ Recent Labs    03/19/18 2053  TSH 1.001   ------------------------------------------------------------------------------------------------------------------ No results for input(s): VITAMINB12, FOLATE, FERRITIN, TIBC, IRON, RETICCTPCT in the last 72 hours.  Coagulation profile No results for input(s): INR, PROTIME in the last 168 hours.  No results for input(s): DDIMER in the last 72 hours.  Cardiac Enzymes Recent Labs  Lab 03/20/18 0347 03/20/18 0838  TROPONINI <0.03 <0.03   ------------------------------------------------------------------------------------------------------------------    Component Value Date/Time   BNP 52.0 03/19/2018 2053    Inpatient Medications  Scheduled Meds: . enoxaparin (LOVENOX) injection  40 mg Subcutaneous Daily  . metoprolol succinate  50 mg Oral Daily  . pantoprazole  40 mg Oral Daily  . thyroid  60 mg Oral q morning - 10a  . topiramate  50 mg Oral QPM   Continuous Infusions: . sodium chloride 10 mL/hr at 03/19/18 2152   PRN Meds:.sodium chloride, acetaminophen **OR** acetaminophen, ondansetron **OR** ondansetron (ZOFRAN) IV, zolpidem  Micro Results No results found for this or any previous visit (from the past 240 hour(s)).  Radiology Reports Dg Chest 2 View  Result Date: 03/10/2018 CLINICAL DATA:  Chest pain. EXAM: CHEST - 2 VIEW COMPARISON:  Radiographs of July 18, 2012. FINDINGS: The heart size  and mediastinal contours are within normal limits. Both  lungs are clear. No pneumothorax or pleural effusion is noted. The visualized skeletal structures are unremarkable. IMPRESSION: No active cardiopulmonary disease. Electronically Signed   By: Marijo Conception, M.D.   On: 03/10/2018 14:35     Phillips Climes M.D on 03/20/2018 at 4:17 PM  Between 7am to 7pm - Pager - [phone]  After 7pm go to [email or web address] - password [facility]  [facility] -  Office  [phone]

## 2018-03-20 NOTE — Plan of Care (Signed)
Pt able to sleep and rest.

## 2018-03-21 ENCOUNTER — Observation Stay (HOSPITAL_BASED_OUTPATIENT_CLINIC_OR_DEPARTMENT_OTHER): Payer: BLUE CROSS/BLUE SHIELD

## 2018-03-21 DIAGNOSIS — I471 Supraventricular tachycardia: Secondary | ICD-10-CM

## 2018-03-21 LAB — BASIC METABOLIC PANEL
Anion gap: 7 (ref 5–15)
BUN: 9 mg/dL (ref 6–20)
CO2: 22 mmol/L (ref 22–32)
Calcium: 8.9 mg/dL (ref 8.9–10.3)
Chloride: 111 mmol/L (ref 98–111)
Creatinine, Ser: 0.74 mg/dL (ref 0.44–1.00)
GFR calc Af Amer: >60 mL/min (ref >60)
GFR calc non Af Amer: >60 mL/min (ref >60)
Glucose, Bld: 144 mg/dL — ABNORMAL HIGH (ref 70–99)
POTASSIUM: 4.1 mmol/L (ref 3.5–5.1)
SODIUM: 140 mmol/L (ref 135–145)

## 2018-03-21 LAB — HIV ANTIBODY (ROUTINE TESTING W REFLEX): HIV Screen 4th Generation wRfx: NONREACTIVE

## 2018-03-21 LAB — ECHOCARDIOGRAM COMPLETE: Weight: 3720 oz

## 2018-03-21 MED ORDER — PANTOPRAZOLE SODIUM 40 MG PO TBEC
40.0000 mg | DELAYED_RELEASE_TABLET | Freq: Every day | ORAL | Status: DC
  Administered 2018-03-21: 40 mg via ORAL
  Filled 2018-03-21: qty 1

## 2018-03-21 MED ORDER — METOPROLOL TARTRATE 12.5 MG HALF TABLET
12.5000 mg | ORAL_TABLET | Freq: Two times a day (BID) | ORAL | Status: DC
  Administered 2018-03-21: 12.5 mg via ORAL
  Filled 2018-03-21: qty 1

## 2018-03-21 MED ORDER — METOPROLOL SUCCINATE ER 25 MG PO TB24: 12.5000 mg | ORAL_TABLET | Freq: Two times a day (BID) | ORAL | 0 refills | Status: DC

## 2018-03-21 MED ORDER — ENOXAPARIN SODIUM 60 MG/0.6ML ~~LOC~~ SOLN
50.0000 mg | Freq: Every day | SUBCUTANEOUS | Status: DC
  Administered 2018-03-21: 50 mg via SUBCUTANEOUS
  Filled 2018-03-21: qty 0.6

## 2018-03-21 NOTE — Discharge Instructions (Signed)
If you experience your heart racing again, try to bear down for 5 seconds and repeat a few times if necessary. You can also try splashing ice cold water on your face. These maneuvers can sometimes put your heart back into a normal rhythm. If your symptoms persist, you should present to the emergency room for further care.

## 2018-03-21 NOTE — Progress Notes (Signed)
  Echocardiogram 2D Echocardiogram has been performed.  Merrie Roof F 03/21/2018, 10:31 AM

## 2018-03-21 NOTE — Discharge Summary (Signed)
Shelly [name], is a 57 y.o. female  DOB 27-Aug-1960  MRN [id number].  Admission date:  03/19/2018  Admitting Physician  Reubin Milan, MD  Discharge Date:  03/21/2018   Primary MD  Corrington, [name], MD  Recommendations for primary care physician for things to follow:  - please check CBC, BMP during next visit  Admission Diagnosis  heart palpitations   Discharge Diagnosis  heart palpitations    Principal Problem:   SVT (supraventricular tachycardia) ([place]) Active Problems:   GERD (gastroesophageal reflux disease)   Hypothyroidism   Hypokalemia   Hyperglycemia      Past Medical History:  Diagnosis Date  . Acid reflux   . Adverse effect of general anesthetic    shivering recently-had to have demerol  . Arthritis   . Carpal tunnel syndrome   . Focal nodular hyperplasia of liver surgery in 2004   partial hepactectomy  . GERD (gastroesophageal reflux disease)   . Hypothyroidism   . PONV (postoperative nausea and vomiting)   . Wears glasses     Past Surgical History:  Procedure Laterality Date  . CARPAL TUNNEL RELEASE     rt  . CARPAL TUNNEL RELEASE Left 01/12/2013   Procedure: CARPAL TUNNEL RELEASE;  Surgeon: Cammie Sickle., MD;  Location: [facility];  Service: Orthopedics;  Laterality: Left;  . CARPOMETACARPEL SUSPENSION PLASTY Left 07/24/2014   Procedure: LEFT THUMB CARPOMETACARPEL ([place]) ARTHROPLASTY GARCIA ELIAS ;  Surgeon: Leanora Cover, MD;  Location: [facility];  Service: Orthopedics;  Laterality: Left;  . CHOLECYSTECTOMY  2004   liver resection benign tumor with GB  . COLONOSCOPY  03/05/2011   Procedure: COLONOSCOPY;  Surgeon: Rogene Houston, MD;  [facility];  Service: Endoscopy;  Laterality: N/A;  . DORSAL COMPARTMENT RELEASE Left 01/12/2013   Procedure: RELEASE DORSAL COMPARTMENT ([place]);  Surgeon: Cammie Sickle., MD;   Location: [facility];  Service: Orthopedics;  Laterality: Left;  . ESOPHAGOGASTRODUODENOSCOPY  03/05/2011   Procedure: ESOPHAGOGASTRODUODENOSCOPY (EGD);  Surgeon: Rogene Houston, MD;  [facility];  Service: Endoscopy;  Laterality: N/A;  . ESOPHAGOGASTRODUODENOSCOPY N/A 05/14/2016   Procedure: ESOPHAGOGASTRODUODENOSCOPY (EGD);  Surgeon: Rogene Houston, MD;  [facility];  Service: Endoscopy;  Laterality: N/A;  300  . FRACTURE SURGERY  2011   rt ankle  . PARTIAL HYSTERECTOMY    . STERIOD INJECTION Left 01/12/2013   Procedure: INJECTION LEFT THUMB [place] JOINT;  Surgeon: Cammie Sickle., MD;  Location: [facility] [facility];  Service: Orthopedics;  Laterality: Left;  . THYROIDECTOMY, PARTIAL    . TIBIA HARDWARE REMOVAL  2011   rt ankle   . TRIGGER FINGER RELEASE Left 01/12/2013   Procedure: RELEASE TRIGGER FINGER/A-1 PULLEY;  Surgeon: Cammie Sickle., MD;  Location: [place];  Service: Orthopedics;  Laterality: Left;       History of present illness and  Hospital Course:     Kindly see H&P for  history of present illness and admission details, please review complete Labs, Consult reports and Test reports for all details in brief  HPI  from the history and physical done on the day of admission 03/19/2018   HPI: Shelly [name] is a 57 y.o. 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  On 03/10/2018, her work-up was reassuring and was discharged home.  However, she mentions that she has continued having palpitations, which started about 2 months ago.  She has stopped caffeine consumption, but has not noticed any changes.  She is currently on low-dose metoprolol, but this has not stopped her palpitations.  She mentions that  she has this history who has a history of WPW.  Today, palpitations were very intense and she noticed that her heart rate was in the 170s to 180s at home so she decided to return to the ED.  Associated symptoms are mild lightheadedness, but denies chest pain, dyspnea, nausea, diaphoresis, PND orthopnea.  She occasionally gets mild pitting edema of the ankles.  She denies abdominal pain, nausea, emesis, diarrhea, constipation, melena or hematochezia.  Denies dysuria, frequency or hematuria.  No polyuria, polydipsia, polyphagia or blurred vision.  ED Course: Initial vital signs temperature 97.7, pulse 160, respirations 11, blood pressure 117/81 mmHg and O2 sat 99% on room air.  The patient was given 40 mEq of KCl in the ED.  I added and normal saline plus KCl 20 mEq 1000 mL over 4-hour bolus, metoprolol 5 mg IVP x1 and magnesium sulfate 2 g IVPB.  Her EKG showed SVT at 173 bpm. Her lab work shows an normal troponin. BNP of 52.0 pg/mL.Marland Kitchen  White count 7.5, hemoglobin 12.9 g/dL and platelets 145.  Sodium 139, potassium 3.3, chloride 108 and CO2 25 mmol/L.  BUN was 14, creatinine 0.8, glucose 200, calcium 8.8 and magnesium 1.8 mg/dL.  H was 1.001 uU/L.  Last week's that did not show any active cardiopulmonary disease.    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 On 03/10/2018, her work-up was reassuring and was discharged home. However, she mentions that she has continued having palpitations, which started about 2 months ago. She has stopped caffeine consumption, but has not noticedany changes. She is currently on low-dose metoprolol, but this has not stopped her palpitations, ED her EKG showed SVT at 173, hypokalemia with potassium 3.3.   SVT (supraventricular  tachycardia) ([place]) -She does report previous episode 2 to 3 years ago, ED visit last week with similar presentation, noted to have SVT in ED. -Cardiology input greatly appreciated, was performed, preserved EF, with no regional wall motion abnormalities, recommendation per cardiology to continue Toprol-XL 12.5 mg twice daily, and to follow with cardiology as an outpatient, if has recurrence, likely will need to be switched to flecainide.  GERD -Continue with PPI  Hypothyroidism - Continue Armour Thyroid 60 mg p.o. daily.  Hypokalemia - Corrected. - Magnesium was supplemented as well.  Hyperglycemia -A1c is 6.7, which is diagnostic diabetes mellitus, I have counseled her about diet and exercise, this is to be further followed by her PCP        Discharge  Condition: *   Follow UP  Follow-up Information    Herminio Commons, MD Follow up on 04/14/2018.   Specialty:  Cardiology Why:  Please arrive 15 minutes early for your 9:20am appointment Contact information: [place] [address] [phone]             Discharge Instructions  and  Discharge Medications    Discharge Instructions    Diet - low sodium heart healthy   Complete by:  As directed    Discharge instructions   Complete by:  As directed    Follow with Primary MD Corrington, Kip A, MD in 7 days   Get CBC, CMP,checked  by Primary MD next visit.    Activity: As tolerated with Full fall precautions use walker/cane & assistance as needed   Disposition Home    Diet: Heart Healthy ** , with feeding assistance and aspiration precautions.  For Heart failure patients - Check your Weight same time everyday, if you gain over 2 pounds, or you develop in leg swelling, experience more shortness of breath or chest pain, call your Primary MD immediately. Follow Cardiac Low Salt Diet and 1.5 lit/day fluid restriction.   On your next visit with your primary care physician please Get  Medicines reviewed and adjusted.   Please request your Prim.MD to go over all Hospital Tests and Procedure/Radiological results at the follow up, please get all Hospital records sent to your Prim MD by signing hospital release before you go home.   If you experience worsening of your admission symptoms, develop shortness of breath, life threatening emergency, suicidal or homicidal thoughts you must seek medical attention immediately by calling 911 or calling your MD immediately  if symptoms less severe.  You Must read complete instructions/literature along with all the possible adverse reactions/side effects for all the Medicines you take and that have been prescribed to you. Take any new Medicines after you have completely understood and accpet all the possible adverse reactions/side effects.   Do not drive, operating heavy machinery, perform activities at heights, swimming or participation in water activities or provide baby sitting services if your were admitted for syncope or siezures until you have seen by Primary MD or a Neurologist and advised to do so again.  Do not drive when taking Pain medications.    Do not take more than prescribed Pain, Sleep and Anxiety Medications  Special Instructions: If you have smoked or chewed Tobacco  in the last 2 yrs please stop smoking, stop any regular Alcohol  and or any Recreational drug use.  Wear Seat belts while driving.   Please note  You were cared for by a hospitalist during your hospital stay. If you have any questions about your discharge medications or the care you received while you were in the hospital after you are discharged, you can call the unit and asked to speak with the hospitalist on call if the hospitalist that took care of you is not available. Once you are discharged, your primary care physician will handle any further medical issues. Please note that NO REFILLS for any discharge medications will be authorized once you are  discharged, as it is imperative that you return to your primary care physician (or establish a relationship with a primary care physician if you do not have one) for your aftercare needs so that they can reassess your need for medications and monitor your lab values.   Increase activity slowly   Complete by:  As directed      Allergies as of 03/21/2018      Reactions   Prednisone Other (See Comments), Anxiety   Other reaction(s): Nervous manic   Ciprofloxacin    Patient states that it was neuro pain , could hardly walk,.   Dairy Aid [lactase]    Eggs Or Egg-derived Products    Erythromycin    Unsure of whether this is an allergy per patient   Nsaids Other (See Comments)   Causes Gastric Problems    Other    gluten   Sulfa Antibiotics Hives   Wheat Bran       Medication List    TAKE these medications   aspirin EC 81 MG tablet Take 324 mg by mouth once.   COSENTYX SENSOREADY 300 DOSE 150 MG/ML [place] Generic drug:  Secukinumab Take 300 mg by mouth every 30 (thirty) days. Takes injection once a month   esomeprazole 20 MG capsule Commonly known as:  NEXIUM Take 2 capsules (40 mg total) by mouth daily before breakfast. What changed:    how much to take  when to take this   metoprolol succinate 25 MG 24 hr tablet Commonly known as:  TOPROL-XL Take 0.5 tablets (12.5 mg total) by mouth 2 (two) times daily.   thyroid 60 MG tablet Commonly known as:  ARMOUR Take 60 mg by mouth every morning.   TROKENDI XR 50 MG Cp24 Generic drug:  Topiramate ER Take 50 mg by mouth every evening.         Diet and Activity recommendation: See Discharge Instructions above   Consults obtained -  Cardiology   Major procedures and Radiology Reports - PLEASE review detailed and final reports for all details, in brief -      Dg Chest 2 View  Result Date: 03/10/2018 CLINICAL DATA:  Chest pain. EXAM: CHEST - 2 VIEW COMPARISON:  Radiographs of July 18, 2012. FINDINGS: The heart  size and mediastinal contours are within normal limits. Both lungs are clear. No pneumothorax or pleural effusion is noted. The visualized skeletal structures are unremarkable. IMPRESSION: No active cardiopulmonary disease. Electronically Signed   By: Marijo Conception, M.D.   On: 03/10/2018 14:35    Micro Results     No results found for this or any previous visit (from the past 240 hour(s)).     Today   Subjective:   Atalie Oros today has no headache,no chest abdominal pain,no new weakness tingling or numbness, feels much better wants to go home today.   Objective:   Blood pressure 134/89, pulse 87, temperature 97.9 F (36.6 C), temperature source Oral, resp. rate 16, weight 105.5 kg (232 lb 8 oz), SpO2 97 %.   Intake/Output Summary (Last 24 hours) at 03/21/2018 1249 Last data filed at 03/21/2018 1100 Gross per 24 hour  Intake 720 ml  Output -  Net 720 ml    Exam Awake Alert, Oriented x 3, No new F.N deficits, Normal affect Symmetrical Chest wall movement, Good air movement bilaterally, CTAB RRR,No Gallops,Rubs or new Murmurs, No Parasternal Heave +ve B.Sounds, Abd Soft, Non tender, No organomegaly appriciated, No rebound -guarding or rigidity. No Cyanosis, Clubbing or edema, No new Rash or bruise  Data Review   CBC w Diff:  Lab Results  Component Value Date   WBC 7.5 03/19/2018   HGB 12.9 03/19/2018   HCT 39.4 03/19/2018   PLT 245 03/19/2018   LYMPHOPCT 31 02/18/2014   MONOPCT 7 02/18/2014   EOSPCT 1  02/18/2014   BASOPCT 0 02/18/2014    CMP:  Lab Results  Component Value Date   NA 140 03/21/2018   K 4.1 03/21/2018   CL 111 03/21/2018   CO2 22 03/21/2018   BUN 9 03/21/2018   CREATININE 0.74 03/21/2018   PROT 6.8 02/18/2014   ALBUMIN 3.7 02/18/2014   BILITOT 0.3 02/18/2014   ALKPHOS 63 02/18/2014   AST 23 02/18/2014   ALT 32 02/18/2014  .   Total Time in preparing paper work, data evaluation and todays exam - 56 minutes  Phillips Climes M.D on  03/21/2018 at 12:49 PM  [facility]   Office  [phone]

## 2018-03-21 NOTE — Care Management Note (Signed)
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                     Action/Plan: NCM will follow for transition of care needs.  Expected Discharge Date:                  Expected Discharge Plan:  Home/Self Care  In-House Referral:     Discharge planning Services  CM Consult  Post Acute Care Choice:    Choice offered to:     DME Arranged:    DME Agency:     HH Arranged:    HH Agency:     Status of Service:  In process, will continue to follow  If discussed at Long Length of Stay Meetings, dates discussed:    Additional Comments:  Zenon Mayo, RN 03/21/2018, 9:53 AM

## 2018-03-21 NOTE — Progress Notes (Addendum)
Progress Note  Patient Name: Shelly [name] Date of Encounter: 03/21/2018  Primary Cardiologist: New to [place]   Reports feeling fine. Has not been out of bed much in the past 24 hours. Has not had recurrence of tachycardia in the past 24 hours. Encouraged patient to ambulate around the unit this morning. She denies chest pain or SOB.   Inpatient Medications    Scheduled Meds: . enoxaparin (LOVENOX) injection  40 mg Subcutaneous Daily  . thyroid  60 mg Oral q morning - 10a  . topiramate  50 mg Oral QPM   Continuous Infusions: . sodium chloride 10 mL/hr at 03/19/18 2152   PRN Meds: sodium chloride, acetaminophen **OR** acetaminophen, ondansetron **OR** ondansetron (ZOFRAN) IV, zolpidem   Vital Signs    Vitals:   03/20/18 1300 03/20/18 1400 03/20/18 2023 03/21/18 0449  BP: 139/78 139/78 133/65 131/81  Pulse: 60  60 60  Resp:   14 16  Temp:   98.1 F (36.7 C) 97.9 F (36.6 C)  TempSrc:   Oral Oral  SpO2: 100%  98% 97%  Weight:    232 lb 8 oz (105.5 kg)    Intake/Output Summary (Last 24 hours) at 03/21/2018 0750 Last data filed at 03/20/2018 1917 Gross per 24 hour  Intake 720 ml  Output -  Net 720 ml   Filed Weights   03/21/18 0449  Weight: 232 lb 8 oz (105.5 kg)    Telemetry    NSR/sinus bradycardia with occasional PVCs. No recurrence of SVT in the past 24 hours.  - Personally Reviewed   Physical Exam   GEN: Laying in bed in no acute distress.   Neck: No JVD, no carotid bruits Cardiac: RRR, no murmurs, rubs, or gallops.  Respiratory: Clear to auscultation bilaterally, no wheezes/ rales/ rhonchi GI: NABS, Soft, obese, nontender, non-distended  MS: No edema; No deformity. Neuro:  Nonfocal, moving all extremities spontaneously Psych: Normal affect   Labs    Chemistry Recent Labs  Lab 03/19/18 2053 03/20/18 0838 03/21/18 0442  NA 139 141 140  K 3.3* 4.7 4.1  CL 108 113* 111  CO2 25 21* 22  GLUCOSE 200* 154* 144*  BUN 14 10 9     CREATININE 0.80 0.63 0.74  CALCIUM 8.8* 8.4* 8.9  GFRNONAA >60 >60 >60  GFRAA >60 >60 >60  ANIONGAP 6 7 7      Hematology Recent Labs  Lab 03/19/18 2053  WBC 7.5  RBC 4.36  HGB 12.9  HCT 39.4  MCV 90.4  MCH 29.6  MCHC 32.7  RDW 13.6  PLT 245    Cardiac Enzymes Recent Labs  Lab 03/20/18 0347 03/20/18 0838  TROPONINI <0.03 <0.03    Recent Labs  Lab 03/19/18 2056  TROPIPOC 0.00     BNP Recent Labs  Lab 03/19/18 2053  BNP 52.0     DDimer No results for input(s): DDIMER in the last 168 hours.   Radiology    No results found.  Cardiac Studies   Echo pending  Patient Profile     57 y.o. 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   Assessment & Plan    1. SVT: HR noted to be in the 170s with spontaneous conversion to NSR. She was given IV metoprolol in the ED but HR has been in the 50s at rest so home metoprolol was held. She has asymptomatic bradycardia and HR  increases to the 70s-80s when she sits up and moves around. - Could continue home metoprolol 12.5mg  BID vs. Prn metoprolol dosing for tachycardia.  - Echocardiogram pending - if LV function is normal, do not ancipitate further cardiac work-up.  - Will arrange follow-up in our [facility] office  For questions or updates, please contact [facility] Please consult [email or web address] for contact info under Cardiology/STEMI.   Signed, Abigail Butts, PA-C  03/21/2018, 7:50 AM   [phone]  The patient was seen, examined and discussed with Abigail Butts, PA-C  and I agree with the above.   No recurrence of SVT, I would restart home metoprolol 12.5 mg po BID, if recurrent arrhythmia, we will consider a flecainide. I have personally reviewed her echo and she has hyperdynamic LVEF 65-70% and no significant valvular abnormalities.  She can be discharged, we will arrange for a follow up in [place].    Ena Dawley, MD 03/21/2018

## 2018-03-21 NOTE — Progress Notes (Signed)
Patient received discharge information and acknowledged understanding of it. Patient received printed prescription. Patient IV was removed.   

## 2018-04-14 ENCOUNTER — Ambulatory Visit: Payer: BLUE CROSS/BLUE SHIELD | Admitting: Cardiovascular Disease

## 2018-04-14 ENCOUNTER — Encounter: Payer: Self-pay | Admitting: Cardiovascular Disease

## 2018-04-14 VITALS — BP 120/73 | HR 50 | Ht 66.0 in | Wt 231.0 lb

## 2018-04-14 DIAGNOSIS — I471 Supraventricular tachycardia: Secondary | ICD-10-CM | POA: Diagnosis not present

## 2018-04-14 DIAGNOSIS — R002 Palpitations: Secondary | ICD-10-CM | POA: Diagnosis not present

## 2018-04-14 DIAGNOSIS — Z9289 Personal history of other medical treatment: Secondary | ICD-10-CM | POA: Diagnosis not present

## 2018-04-14 DIAGNOSIS — R55 Syncope and collapse: Secondary | ICD-10-CM

## 2018-04-14 NOTE — Progress Notes (Signed)
SUBJECTIVE: The patient presents to establish care in our [place] office.  She has a history of hypothyroidism, GERD, and symptomatic PVCs for which she takes metoprolol.  She was recently hospitalized with palpitations and tachycardia and found to have SVT with a heart rate in the 170 bpm range which spontaneously converted to sinus rhythm.  She was discharged on Toprol-XL 12.5 mg twice daily.  It was mentioned that if she had arrhythmia recurrence, flecainide could be considered.  Echocardiogram demonstrated hyperdynamic left ventricular systolic function, LVEF 65 to 70%.  There were no significant valvular abnormalities.  She continues to experience palpitations lasting anywhere from seconds to minutes.  She has had 2 episodes of near syncope.  She is afraid to drive.  She has cut out caffeine altogether.  Her sister has Wolff-Parkinson-White syndrome and has undergone 2 ablations.    Review of Systems: As per "subjective", otherwise negative.  Allergies  Allergen Reactions  . Prednisone Other (See Comments) and Anxiety    Other reaction(s): Nervous manic   . Ciprofloxacin     Patient states that it was neuro pain , could hardly walk,.  . Dairy Aid [Lactase]   . Eggs Or Egg-Derived Products   . Erythromycin     Unsure of whether this is an allergy per patient   . Nsaids Other (See Comments)    Causes Gastric Problems   . Other     gluten  . Sulfa Antibiotics Hives  . Wheat Bran     Current Outpatient Medications  Medication Sig Dispense Refill  . esomeprazole (NEXIUM) 20 MG capsule Take 2 capsules (40 mg total) by mouth daily before breakfast. (Patient taking differently: Take 20 mg by mouth every morning. ) 30 capsule 0  . metoprolol succinate (TOPROL-XL) 25 MG 24 hr tablet Take 0.5 tablets (12.5 mg total) by mouth 2 (two) times daily. 30 tablet 0  . Secukinumab (COSENTYX SENSOREADY 300 DOSE) 150 MG/ML SOAJ Take 300 mg by mouth every 30 (thirty) days. Takes injection  once a month    . thyroid (ARMOUR) 60 MG tablet Take 60 mg by mouth every morning.     . Topiramate ER (TROKENDI XR) 50 MG CP24 Take 50 mg by mouth every evening.      No current facility-administered medications for this visit.     Past Medical History:  Diagnosis Date  . Acid reflux   . Adverse effect of general anesthetic    shivering recently-had to have demerol  . Arthritis   . Carpal tunnel syndrome   . Focal nodular hyperplasia of liver surgery in 2004   partial hepactectomy  . GERD (gastroesophageal reflux disease)   . Hypothyroidism   . PONV (postoperative nausea and vomiting)   . Wears glasses     Past Surgical History:  Procedure Laterality Date  . CARPAL TUNNEL RELEASE     rt  . CARPAL TUNNEL RELEASE Left 01/12/2013   Procedure: CARPAL TUNNEL RELEASE;  Surgeon: Cammie Sickle., MD;  Location: [place];  Service: Orthopedics;  Laterality: Left;  . CARPOMETACARPEL SUSPENSION PLASTY Left 07/24/2014   Procedure: LEFT THUMB CARPOMETACARPEL ([place]) ARTHROPLASTY GARCIA ELIAS ;  Surgeon: Leanora Cover, MD;  Location: [place];  Service: Orthopedics;  Laterality: Left;  . CHOLECYSTECTOMY  2004   liver resection benign tumor with GB  . COLONOSCOPY  03/05/2011   Procedure: COLONOSCOPY;  Surgeon: Rogene Houston, MD;  [facility];  Service: Endoscopy;  Laterality: N/A;  . DORSAL COMPARTMENT RELEASE Left 01/12/2013   Procedure: RELEASE DORSAL COMPARTMENT (DEQUERVAIN);  Surgeon: Cammie Sickle., MD;  Location: [facility];  Service: Orthopedics;  Laterality: Left;  . ESOPHAGOGASTRODUODENOSCOPY  03/05/2011   Procedure: ESOPHAGOGASTRODUODENOSCOPY (EGD);  Surgeon: Rogene Houston, MD;  [facility];  Service: Endoscopy;  Laterality: N/A;  . ESOPHAGOGASTRODUODENOSCOPY N/A 05/14/2016   Procedure: ESOPHAGOGASTRODUODENOSCOPY (EGD);  Surgeon: Rogene Houston, MD;  [facility];  Service: Endoscopy;   Laterality: N/A;  300  . FRACTURE SURGERY  2011   rt ankle  . PARTIAL HYSTERECTOMY    . STERIOD INJECTION Left 01/12/2013   Procedure: INJECTION LEFT THUMB [place] JOINT;  Surgeon: Cammie Sickle., MD;  Location: [place];  Service: Orthopedics;  Laterality: Left;  . THYROIDECTOMY, PARTIAL    . TIBIA HARDWARE REMOVAL  2011   rt ankle   . TRIGGER FINGER RELEASE Left 01/12/2013   Procedure: RELEASE TRIGGER FINGER/A-1 PULLEY;  Surgeon: Cammie Sickle., MD;  Location: [place];  Service: Orthopedics;  Laterality: Left;    Social History   Socioeconomic History  . Marital status: Married    Spouse name: Not on file  . Number of children: Not on file  . Years of education: Not on file  . Highest education level: Not on file  Occupational History  . Not on file  Social Needs  . Financial resource strain: Not on file  . Food insecurity:    Worry: Not on file    Inability: Not on file  . Transportation needs:    Medical: Not on file    Non-medical: Not on file  Tobacco Use  . Smoking status: Former Smoker    Packs/day: 0.25    Years: 0.50    Pack years: 0.12    Types: Cigarettes    Last attempt to quit: 07/19/2013    Years since quitting: 4.7  . Smokeless tobacco: Never Used  Substance and Sexual Activity  . Alcohol use: Yes    Comment: occ  . Drug use: No  . Sexual activity: Yes    Comment: has a rare cigarette  Lifestyle  . Physical activity:    Days per week: Not on file    Minutes per session: Not on file  . Stress: Not on file  Relationships  . Social connections:    Talks on phone: Not on file    Gets together: Not on file    Attends religious service: Not on file    Active member of club or organization: Not on file    Attends meetings of clubs or organizations: Not on file    Relationship status: Not on file  . Intimate partner violence:    Fear of current or ex partner: Not on file    Emotionally abused: Not on file     Physically abused: Not on file    Forced sexual activity: Not on file  Other Topics Concern  . Not on file  Social History Narrative  . Not on file     Vitals:   04/14/18 0911  BP: 120/73  Pulse: (!) 50  SpO2: 97%  Weight: 231 lb (104.8 kg)  Height: 5\' 6"  (1.676 m)    Wt Readings from Last 3 Encounters:  04/14/18 231 lb (104.8 kg)  03/21/18 232 lb 8 oz (105.5 kg)  03/10/18 220 lb (99.8 kg)     PHYSICAL EXAM General: NAD  HEENT: Normal. Neck: No JVD, no thyromegaly. Lungs: Clear to auscultation bilaterally with normal respiratory effort. CV: Regular rate and rhythm, normal S1/S2, no S3/S4, no murmur. No pretibial or periankle edema.  No carotid bruit.   Abdomen: Soft, nontender, no distention.  Neurologic: Alert and oriented.  Psych: Normal affect. Skin: Normal. Musculoskeletal: No gross deformities.    ECG: Reviewed above under Subjective   Labs:    Lipids: Lab Results  Component Value Date/Time   LDLCALC (H) 06/22/2007 02:35 PM    180        Total Cholesterol/HDL:CHD Risk Coronary Heart Disease Risk Table                     Men   Women  1/2 Average Risk   3.4   3.3   CHOL (H) 06/22/2007 02:35 PM    245        ATP III CLASSIFICATION:  <200     mg/dL   Desirable  200-239  mg/dL   Borderline High  >=240    mg/dL   High   TRIG 177 (H) 06/22/2007 02:35 PM   HDL 30 (L) 06/22/2007 02:35 PM       ASSESSMENT AND PLAN: 1.  Paroxysmal SVT/palpitations with near syncope: She has failed medical therapy with Toprol-XL 12.5 mg twice daily.  Heart rate is currently 50 bpm.  I think she would be best served with radiofrequency ablation.  We discussed this at length.  I will make an EP referral.    Disposition: Follow up with EP   Kate Sable, M.D., F.A.C.C.

## 2018-04-14 NOTE — Addendum Note (Signed)
Addended by: Laurine Blazer on: 04/14/2018 09:48 AM   Modules accepted: Orders

## 2018-04-14 NOTE — Patient Instructions (Signed)
Medication Instructions:  Continue all current medications.  Labwork: none  Testing/Procedures: none  Follow-Up: As needed   Any Other Special Instructions Will Be Listed Below (If Applicable). You have been referred to:  EP   If you need a refill on your cardiac medications before your next appointment, please call your pharmacy.

## 2018-04-15 ENCOUNTER — Ambulatory Visit: Payer: BLUE CROSS/BLUE SHIELD | Admitting: Internal Medicine

## 2018-04-15 ENCOUNTER — Encounter: Payer: Self-pay | Admitting: Internal Medicine

## 2018-04-15 VITALS — BP 118/82 | HR 56 | Ht 66.0 in | Wt 232.4 lb

## 2018-04-15 DIAGNOSIS — I471 Supraventricular tachycardia, unspecified: Secondary | ICD-10-CM

## 2018-04-15 DIAGNOSIS — R002 Palpitations: Secondary | ICD-10-CM

## 2018-04-15 DIAGNOSIS — Z9289 Personal history of other medical treatment: Secondary | ICD-10-CM

## 2018-04-15 LAB — CBC WITH DIFFERENTIAL/PLATELET
BASOS ABS: 0 10*3/uL (ref 0.0–0.2)
BASOS: 1 %
EOS (ABSOLUTE): 0.2 10*3/uL (ref 0.0–0.4)
EOS: 4 %
HEMATOCRIT: 37.5 % (ref 34.0–46.6)
HEMOGLOBIN: 12.8 g/dL (ref 11.1–15.9)
IMMATURE GRANS (ABS): 0 10*3/uL (ref 0.0–0.1)
Immature Granulocytes: 0 %
LYMPHS ABS: 1.9 10*3/uL (ref 0.7–3.1)
LYMPHS: 32 %
MCH: 29.6 pg (ref 26.6–33.0)
MCHC: 34.1 g/dL (ref 31.5–35.7)
MCV: 87 fL (ref 79–97)
MONOCYTES: 10 %
Monocytes Absolute: 0.6 10*3/uL (ref 0.1–0.9)
Neutrophils Absolute: 3 10*3/uL (ref 1.4–7.0)
Neutrophils: 53 %
Platelets: 234 10*3/uL (ref 150–450)
RBC: 4.32 x10E6/uL (ref 3.77–5.28)
RDW: 12.8 % (ref 12.3–15.4)
WBC: 5.8 10*3/uL (ref 3.4–10.8)

## 2018-04-15 LAB — BASIC METABOLIC PANEL
BUN / CREAT RATIO: 15 (ref 9–23)
BUN: 13 mg/dL (ref 6–24)
CO2: 22 mmol/L (ref 20–29)
CREATININE: 0.89 mg/dL (ref 0.57–1.00)
Calcium: 9.2 mg/dL (ref 8.7–10.2)
Chloride: 104 mmol/L (ref 96–106)
GFR calc Af Amer: 83 mL/min/{1.73_m2} (ref >59)
GFR, EST NON AFRICAN AMERICAN: 72 mL/min/{1.73_m2} (ref >59)
GLUCOSE: 143 mg/dL — AB (ref 65–99)
Potassium: 4.4 mmol/L (ref 3.5–5.2)
Sodium: 140 mmol/L (ref 134–144)

## 2018-04-15 NOTE — H&P (View-Only) (Signed)
Electrophysiology Office Note   Date:  04/15/2018   ID:  Shelly [name], Shelly [name] 1961/07/18, MRN [id number]  PCP:  Curly Rim, MD  Cardiologist:  Dr Bronson Ing Primary Electrophysiologist: Thompson Grayer, MD    CC: tachycardia   History of Present Illness: Shelly [name] is a 57 y.o. female who presents today for electrophysiology evaluation.   She is referred by Dr Bronson Ing for EP consultation regarding symptomatic SVT.  She presented to the hospital 03/19/18 with documented mid RP SVT at 170 bpm.  This spontaneously converted to sinus.  She has been treated previously with toprol.  She has palpitations lasting typically 1-2 minutes.  Episodes are abrupt on onset but gradual in offset.  She reports having similar palpitations for about 4 years but feels that they have significantly increased in frequency over the past 6 months.  Episodes occur every 2-3 days.  She is unaware of triggers/ precipitants.  She has tried vagal maneuvers without termination.  She has reduced caffeine and quit CBD oil without improvement.  During episodes she reports associated chest discomfort, dizziness and a sensation that her head is "going to explode".  Today, she denies symptoms of chest pain, shortness of breath, orthopnea, PND, lower extremity edema, claudication, dizziness, presyncope, syncope, bleeding, or neurologic sequela. The patient is tolerating medications without difficulties and is otherwise without complaint today.    Past Medical History:  Diagnosis Date  . Acid reflux   . Adverse effect of general anesthetic    shivering recently-had to have demerol  . Arthritis   . Carpal tunnel syndrome   . Focal nodular hyperplasia of liver surgery in 2004   partial hepactectomy  . GERD (gastroesophageal reflux disease)   . Hypothyroidism   . PONV (postoperative nausea and vomiting)   . SVT (supraventricular tachycardia) ([place]) 03/19/2018   documented with EKG in epic.  . Wears glasses    Past  Surgical History:  Procedure Laterality Date  . CARPAL TUNNEL RELEASE     rt  . CARPAL TUNNEL RELEASE Left 01/12/2013   Procedure: CARPAL TUNNEL RELEASE;  Surgeon: Cammie Sickle., MD;  Location: [place];  Service: Orthopedics;  Laterality: Left;  . CARPOMETACARPEL SUSPENSION PLASTY Left 07/24/2014   Procedure: LEFT THUMB CARPOMETACARPEL ([place]) ARTHROPLASTY GARCIA ELIAS ;  Surgeon: Leanora Cover, MD;  Location: [place];  Service: Orthopedics;  Laterality: Left;  . CHOLECYSTECTOMY  2004   liver resection benign tumor with GB  . COLONOSCOPY  03/05/2011   Procedure: COLONOSCOPY;  Surgeon: Rogene Houston, MD;  [facility];  Service: Endoscopy;  Laterality: N/A;  . DORSAL COMPARTMENT RELEASE Left 01/12/2013   Procedure: RELEASE DORSAL COMPARTMENT (DEQUERVAIN);  Surgeon: Cammie Sickle., MD;  Location: [facility];  Service: Orthopedics;  Laterality: Left;  . ESOPHAGOGASTRODUODENOSCOPY  03/05/2011   Procedure: ESOPHAGOGASTRODUODENOSCOPY (EGD);  Surgeon: Rogene Houston, MD;  [facility];  Service: Endoscopy;  Laterality: N/A;  . ESOPHAGOGASTRODUODENOSCOPY N/A 05/14/2016   Procedure: ESOPHAGOGASTRODUODENOSCOPY (EGD);  Surgeon: Rogene Houston, MD;  [facility];  Service: Endoscopy;  Laterality: N/A;  300  . FRACTURE SURGERY  2011   rt ankle  . PARTIAL HYSTERECTOMY    . STERIOD INJECTION Left 01/12/2013   Procedure: INJECTION LEFT THUMB [place] JOINT;  Surgeon: Cammie Sickle., MD;  Location: [place];  Service: Orthopedics;  Laterality: Left;  . THYROIDECTOMY, PARTIAL    . TIBIA HARDWARE  REMOVAL  2011   rt ankle   . TRIGGER FINGER RELEASE Left 01/12/2013   Procedure: RELEASE TRIGGER FINGER/A-1 PULLEY;  Surgeon: Cammie Sickle., MD;  Location: [place];  Service: Orthopedics;  Laterality: Left;     Current Outpatient Medications  Medication Sig Dispense Refill  .  esomeprazole (NEXIUM) 20 MG capsule Take 20 mg by mouth daily at 12 noon.    . metoprolol succinate (TOPROL-XL) 25 MG 24 hr tablet Take 0.5 tablets (12.5 mg total) by mouth 2 (two) times daily. 30 tablet 0  . Secukinumab (COSENTYX SENSOREADY 300 DOSE) 150 MG/ML SOAJ Take 300 mg by mouth every 30 (thirty) days. Takes injection once a month    . thyroid (ARMOUR) 60 MG tablet Take 60 mg by mouth every morning.     . Topiramate ER (TROKENDI XR) 50 MG CP24 Take 50 mg by mouth every evening.      No current facility-administered medications for this visit.     Allergies:   Prednisone; Ciprofloxacin; Dairy aid [lactase]; Eggs or egg-derived products; Erythromycin; Nsaids; Other; Sulfa antibiotics; and Wheat bran   Social History:  The patient  reports that she quit smoking about 4 years ago. Her smoking use included cigarettes. She has a 0.13 pack-year smoking history. She has never used smokeless tobacco. She reports that she drinks alcohol. She reports that she does not use drugs.   Family History:  The patient's  family history includes Alcohol abuse in her brother; Cirrhosis in her brother and father; Diabetes in her mother.   Sister had WPW with 2 ablations   ROS:  Please see the history of present illness.   All other systems are personally reviewed and negative.    PHYSICAL EXAM: VS:  BP 118/82   Pulse (!) 56   Ht 5\' 6"  (1.676 m)   Wt 232 lb 6.4 oz (105.4 kg)   SpO2 99%   BMI 37.51 kg/m  , BMI Body mass index is 37.51 kg/m. GEN: Well nourished, well developed, in no acute distress  HEENT: normal  Neck: no JVD, carotid bruits, or masses Cardiac: RRR; no murmurs, rubs, or gallops,no edema  Respiratory:  clear to auscultation bilaterally, normal work of breathing GI: soft, nontender, nondistended, + BS MS: no deformity or atrophy  Skin: warm and dry  Neuro:  Strength and sensation are intact Psych: euthymic mood, full affect  EKG:  EKG is ordered today. The ekg ordered today is  personally reviewed and shows sinus rhythm 56 bpm, PR 184 msec, QRS 84 msec, QTc 459 msec   Recent Labs: 03/19/2018: B Natriuretic Peptide 52.0; Hemoglobin 12.9; Magnesium 1.8; Platelets 245; TSH 1.001 03/21/2018: BUN 9; Creatinine, Ser 0.74; Potassium 4.1; Sodium 140  personally reviewed   Lipid Panel     Component Value Date/Time   CHOL (H) 06/22/2007 1435    245        ATP III CLASSIFICATION:  <200     mg/dL   Desirable  200-239  mg/dL   Borderline High  >=240    mg/dL   High   TRIG 177 (H) 06/22/2007 1435   HDL 30 (L) 06/22/2007 1435   CHOLHDL 8.2 06/22/2007 1435   VLDL 35 06/22/2007 1435   LDLCALC (H) 06/22/2007 1435    180        Total Cholesterol/HDL:CHD Risk Coronary Heart Disease Risk Table  Men   Women  1/2 Average Risk   3.4   3.3   personally reviewed   Wt Readings from Last 3 Encounters:  04/15/18 232 lb 6.4 oz (105.4 kg)  04/14/18 231 lb (104.8 kg)  03/21/18 232 lb 8 oz (105.5 kg)      Other studies personally reviewed: Additional studies/ records that were reviewed today include: Dr Court Joy notes, prior hospital records  Review of the above records today demonstrates: as above   ASSESSMENT AND PLAN:  1.  SVT The patient has documented mid RP SVT.  She has failed medical therapy with toprol. Therapeutic strategies for supraventricular tachycardia including medicine and ablation were discussed in detail with the patient today. Risk, benefits, and alternatives to EP study and radiofrequency ablation were also discussed in detail today. These risks include but are not limited to stroke, bleeding, vascular damage, tamponade, perforation, damage to the heart and other structures, AV block requiring pacemaker, worsening renal function, and death. The patient understands these risk and wishes to proceed.  We will therefore proceed with catheter ablation at the next available time.  Carto and anesthesia are requested   Current medicines are  reviewed at length with the patient today.   The patient does not have concerns regarding her medicines.  The following changes were made today:  none  Labs/ tests ordered today include:  Orders Placed This Encounter  Procedures  . EKG 12-Lead    Signed, Thompson Grayer, MD  04/15/2018 9:12 AM     [facility] [address] [place] [address] [phone] (office) [phone] (fax)

## 2018-04-15 NOTE — Patient Instructions (Signed)
Medication Instructions:  Your physician recommends that you continue on your current medications as directed. Please refer to the Current Medication list given to you today.  Labwork: You will have labs drawn today: CBC and BMP for your procedure on 9/17   Testing/Procedures: Your physician has recommended that you have an ablation. Catheter ablation is a medical procedure used to treat some cardiac arrhythmias (irregular heartbeats). During catheter ablation, a long, thin, flexible tube is put into a blood vessel in your groin (upper thigh), or neck. This tube is called an ablation catheter. It is then guided to your heart through the blood vessel. Radio frequency waves destroy small areas of heart tissue where abnormal heartbeats may cause an arrhythmia to start. Please see the instruction sheet given to you today.   Follow-Up: Your physician recommends that you schedule a follow-up appointment in:   4 weeks after 9/17 with Dr Rayann Heman for a post procedure follow up visit.  Any Other Special Instructions Will Be Listed Below (If Applicable).     If you need a refill on your cardiac medications before your next appointment, please call your pharmacy.

## 2018-04-15 NOTE — Progress Notes (Signed)
Electrophysiology Office Note   Date:  04/15/2018   ID:  Shelly, [name] 1961-04-30, MRN [id number]  PCP:  Curly Rim, MD  Cardiologist:  Dr Bronson Ing Primary Electrophysiologist: Thompson Grayer, MD    CC: tachycardia   History of Present Illness: Shelly [name] is a 57 y.o. female who presents today for electrophysiology evaluation.   She is referred by Dr Bronson Ing for EP consultation regarding symptomatic SVT.  She presented to the hospital 03/19/18 with documented mid RP SVT at 170 bpm.  This spontaneously converted to sinus.  She has been treated previously with toprol.  She has palpitations lasting typically 1-2 minutes.  Episodes are abrupt on onset but gradual in offset.  She reports having similar palpitations for about 4 years but feels that they have significantly increased in frequency over the past 6 months.  Episodes occur every 2-3 days.  She is unaware of triggers/ precipitants.  She has tried vagal maneuvers without termination.  She has reduced caffeine and quit CBD oil without improvement.  During episodes she reports associated chest discomfort, dizziness and a sensation that her head is "going to explode".  Today, she denies symptoms of chest pain, shortness of breath, orthopnea, PND, lower extremity edema, claudication, dizziness, presyncope, syncope, bleeding, or neurologic sequela. The patient is tolerating medications without difficulties and is otherwise without complaint today.    Past Medical History:  Diagnosis Date  . Acid reflux   . Adverse effect of general anesthetic    shivering recently-had to have demerol  . Arthritis   . Carpal tunnel syndrome   . Focal nodular hyperplasia of liver surgery in 2004   partial hepactectomy  . GERD (gastroesophageal reflux disease)   . Hypothyroidism   . PONV (postoperative nausea and vomiting)   . SVT (supraventricular tachycardia) ([place]) 03/19/2018   documented with EKG in epic.  . Wears glasses    Past  Surgical History:  Procedure Laterality Date  . CARPAL TUNNEL RELEASE     rt  . CARPAL TUNNEL RELEASE Left 01/12/2013   Procedure: CARPAL TUNNEL RELEASE;  Surgeon: Cammie Sickle., MD;  Location: [place];  Service: Orthopedics;  Laterality: Left;  . CARPOMETACARPEL SUSPENSION PLASTY Left 07/24/2014   Procedure: LEFT THUMB CARPOMETACARPEL ([place]) ARTHROPLASTY GARCIA ELIAS ;  Surgeon: Leanora Cover, MD;  Location: [place];  Service: Orthopedics;  Laterality: Left;  . CHOLECYSTECTOMY  2004   liver resection benign tumor with GB  . COLONOSCOPY  03/05/2011   Procedure: COLONOSCOPY;  Surgeon: Rogene Houston, MD;  [facility];  Service: Endoscopy;  Laterality: N/A;  . DORSAL COMPARTMENT RELEASE Left 01/12/2013   Procedure: RELEASE DORSAL COMPARTMENT ([place]);  Surgeon: Cammie Sickle., MD;  Location: [facility];  Service: Orthopedics;  Laterality: Left;  . ESOPHAGOGASTRODUODENOSCOPY  03/05/2011   Procedure: ESOPHAGOGASTRODUODENOSCOPY (EGD);  Surgeon: Rogene Houston, MD;  [facility];  Service: Endoscopy;  Laterality: N/A;  . ESOPHAGOGASTRODUODENOSCOPY N/A 05/14/2016   Procedure: ESOPHAGOGASTRODUODENOSCOPY (EGD);  Surgeon: Rogene Houston, MD;  [facility];  Service: Endoscopy;  Laterality: N/A;  300  . FRACTURE SURGERY  2011   rt ankle  . PARTIAL HYSTERECTOMY    . STERIOD INJECTION Left 01/12/2013   Procedure: INJECTION LEFT THUMB [place] JOINT;  Surgeon: Cammie Sickle., MD;  Location: [place];  Service: Orthopedics;  Laterality: Left;  . THYROIDECTOMY, PARTIAL    . TIBIA HARDWARE  REMOVAL  2011   rt ankle   . TRIGGER FINGER RELEASE Left 01/12/2013   Procedure: RELEASE TRIGGER FINGER/A-1 PULLEY;  Surgeon: Cammie Sickle., MD;  Location: [place];  Service: Orthopedics;  Laterality: Left;     Current Outpatient Medications  Medication Sig Dispense Refill  .  esomeprazole (NEXIUM) 20 MG capsule Take 20 mg by mouth daily at 12 noon.    . metoprolol succinate (TOPROL-XL) 25 MG 24 hr tablet Take 0.5 tablets (12.5 mg total) by mouth 2 (two) times daily. 30 tablet 0  . Secukinumab (COSENTYX SENSOREADY 300 DOSE) 150 MG/ML SOAJ Take 300 mg by mouth every 30 (thirty) days. Takes injection once a month    . thyroid (ARMOUR) 60 MG tablet Take 60 mg by mouth every morning.     . Topiramate ER (TROKENDI XR) 50 MG CP24 Take 50 mg by mouth every evening.      No current facility-administered medications for this visit.     Allergies:   Prednisone; Ciprofloxacin; Dairy aid [lactase]; Eggs or egg-derived products; Erythromycin; Nsaids; Other; Sulfa antibiotics; and Wheat bran   Social History:  The patient  reports that she quit smoking about 4 years ago. Her smoking use included cigarettes. She has a 0.13 pack-year smoking history. She has never used smokeless tobacco. She reports that she drinks alcohol. She reports that she does not use drugs.   Family History:  The patient's  family history includes Alcohol abuse in her brother; Cirrhosis in her brother and father; Diabetes in her mother.   Sister had WPW with 2 ablations   ROS:  Please see the history of present illness.   All other systems are personally reviewed and negative.    PHYSICAL EXAM: VS:  BP 118/82   Pulse (!) 56   Ht 5\' 6"  (1.676 m)   Wt 232 lb 6.4 oz (105.4 kg)   SpO2 99%   BMI 37.51 kg/m  , BMI Body mass index is 37.51 kg/m. GEN: Well nourished, well developed, in no acute distress  HEENT: normal  Neck: no JVD, carotid bruits, or masses Cardiac: RRR; no murmurs, rubs, or gallops,no edema  Respiratory:  clear to auscultation bilaterally, normal work of breathing GI: soft, nontender, nondistended, + BS MS: no deformity or atrophy  Skin: warm and dry  Neuro:  Strength and sensation are intact Psych: euthymic mood, full affect  EKG:  EKG is ordered today. The ekg ordered today is  personally reviewed and shows sinus rhythm 56 bpm, PR 184 msec, QRS 84 msec, QTc 459 msec   Recent Labs: 03/19/2018: B Natriuretic Peptide 52.0; Hemoglobin 12.9; Magnesium 1.8; Platelets 245; TSH 1.001 03/21/2018: BUN 9; Creatinine, Ser 0.74; Potassium 4.1; Sodium 140  personally reviewed   Lipid Panel     Component Value Date/Time   CHOL (H) 06/22/2007 1435    245        ATP III CLASSIFICATION:  <200     mg/dL   Desirable  200-239  mg/dL   Borderline High  >=240    mg/dL   High   TRIG 177 (H) 06/22/2007 1435   HDL 30 (L) 06/22/2007 1435   CHOLHDL 8.2 06/22/2007 1435   VLDL 35 06/22/2007 1435   LDLCALC (H) 06/22/2007 1435    180        Total Cholesterol/HDL:CHD Risk Coronary Heart Disease Risk Table  Men   Women  1/2 Average Risk   3.4   3.3   personally reviewed   Wt Readings from Last 3 Encounters:  04/15/18 232 lb 6.4 oz (105.4 kg)  04/14/18 231 lb (104.8 kg)  03/21/18 232 lb 8 oz (105.5 kg)      Other studies personally reviewed: Additional studies/ records that were reviewed today include: Dr Court Joy notes, prior hospital records  Review of the above records today demonstrates: as above   ASSESSMENT AND PLAN:  1.  SVT The patient has documented mid RP SVT.  She has failed medical therapy with toprol. Therapeutic strategies for supraventricular tachycardia including medicine and ablation were discussed in detail with the patient today. Risk, benefits, and alternatives to EP study and radiofrequency ablation were also discussed in detail today. These risks include but are not limited to stroke, bleeding, vascular damage, tamponade, perforation, damage to the heart and other structures, AV block requiring pacemaker, worsening renal function, and death. The patient understands these risk and wishes to proceed.  We will therefore proceed with catheter ablation at the next available time.  Carto and anesthesia are requested   Current medicines are  reviewed at length with the patient today.   The patient does not have concerns regarding her medicines.  The following changes were made today:  none  Labs/ tests ordered today include:  Orders Placed This Encounter  Procedures  . EKG 12-Lead    Signed, Thompson Grayer, MD  04/15/2018 9:12 AM     [facility] [address] [place] [address] [phone] (office) [phone] (fax)

## 2018-04-26 ENCOUNTER — Telehealth: Payer: Self-pay | Admitting: Internal Medicine

## 2018-04-26 NOTE — Telephone Encounter (Signed)
° ° °  Patient has questions about ablation versus thyroid.   Patient states last night she had another episode of SVT.  Requesting call from nurse

## 2018-04-26 NOTE — Telephone Encounter (Signed)
Pt called to report that she saw her Endocrinologist Dr. Hartford Poli with [facility]...  and he expressed concern that it was her thyroid that was causing her SVT and will be calling to talk with Dr. Rayann Heman about it.Marland Kitchen She wants [place] to know that she is committed to having the ablation and she has looked over her last several TSH levels and they have been under fairly good control. Her last TSH was 8/19. She wanted to give Dr. Rayann Heman the FYI in case he did receive the call and consider holding off based on the Endocrinologist concerns. I reassured her that we will not cancel the ablation without speaking with her and it would have not been scheduled if Dr. Rayann Heman did not think it was very appropriate. Pt verbalized understanding and felt more relaxed and will let us know of she has any further concerns.

## 2018-04-27 NOTE — Telephone Encounter (Signed)
Returned call to Pt.  Advised Dr. Rayann Heman believes Pt should have ablation for SVT.  Pt thanked nurse for call.  Pt ready for ablation.

## 2018-05-02 NOTE — Anesthesia Preprocedure Evaluation (Addendum)
Anesthesia Evaluation  Patient identified by MRN, date of birth, ID band Patient awake    Reviewed: Allergy & Precautions, NPO status , Patient's Chart, lab work & pertinent test results  Airway Mallampati: III  TM Distance: >3 FB Neck ROM: Full    Dental  (+) Dental Advisory Given   Pulmonary former smoker,    breath sounds clear to auscultation       Cardiovascular hypertension, Pt. on home beta blockers + dysrhythmias Supra Ventricular Tachycardia  Rhythm:Regular Rate:Normal  Normal EF. Valves Okay   Neuro/Psych negative neurological ROS     GI/Hepatic Neg liver ROS, GERD  ,  Endo/Other  Hypothyroidism   Renal/GU negative Renal ROS     Musculoskeletal  (+) Arthritis , Fibromyalgia -  Abdominal   Peds  Hematology negative hematology ROS (+)   Anesthesia Other Findings   Reproductive/Obstetrics                            Lab Results  Component Value Date   WBC 5.8 04/15/2018   HGB 12.8 04/15/2018   HCT 37.5 04/15/2018   MCV 87 04/15/2018   PLT 234 04/15/2018   Lab Results  Component Value Date   CREATININE 0.89 04/15/2018   BUN 13 04/15/2018   NA 140 04/15/2018   K 4.4 04/15/2018   CL 104 04/15/2018   CO2 22 04/15/2018    Anesthesia Physical Anesthesia Plan  ASA: III  Anesthesia Plan: MAC   Post-op Pain Management:    Induction: Intravenous  PONV Risk Score and Plan: 2 and Ondansetron, Treatment may vary due to age or medical condition and Propofol infusion  Airway Management Planned: Natural Airway and Simple Face Mask  Additional Equipment:   Intra-op Plan:   Post-operative Plan:   Informed Consent: I have reviewed the patients History and Physical, chart, labs and discussed the procedure including the risks, benefits and alternatives for the proposed anesthesia with the patient or authorized representative who has indicated his/her understanding and  acceptance.   Dental advisory given  Plan Discussed with: CRNA and Surgeon  Anesthesia Plan Comments:       Anesthesia Quick Evaluation

## 2018-05-03 ENCOUNTER — Ambulatory Visit (HOSPITAL_COMMUNITY): Payer: BLUE CROSS/BLUE SHIELD | Admitting: Anesthesiology

## 2018-05-03 ENCOUNTER — Encounter (HOSPITAL_COMMUNITY)
Admission: RE | Disposition: A | Discharge status: Home / Self Care | Payer: Self-pay | Source: Ambulatory Visit | Attending: Internal Medicine

## 2018-05-03 ENCOUNTER — Ambulatory Visit (HOSPITAL_COMMUNITY)
Admission: RE | Admit: 2018-05-03 | Discharge: 2018-05-03 | Disposition: A | Discharge status: Home / Self Care | Payer: BLUE CROSS/BLUE SHIELD | Source: Ambulatory Visit | Attending: Internal Medicine | Admitting: Internal Medicine

## 2018-05-03 DIAGNOSIS — Z87891 Personal history of nicotine dependence: Secondary | ICD-10-CM | POA: Insufficient documentation

## 2018-05-03 DIAGNOSIS — Z91018 Allergy to other foods: Secondary | ICD-10-CM | POA: Diagnosis not present

## 2018-05-03 DIAGNOSIS — Z886 Allergy status to analgesic agent status: Secondary | ICD-10-CM | POA: Diagnosis not present

## 2018-05-03 DIAGNOSIS — K219 Gastro-esophageal reflux disease without esophagitis: Secondary | ICD-10-CM | POA: Insufficient documentation

## 2018-05-03 DIAGNOSIS — Z90711 Acquired absence of uterus with remaining cervical stump: Secondary | ICD-10-CM | POA: Diagnosis not present

## 2018-05-03 DIAGNOSIS — E039 Hypothyroidism, unspecified: Secondary | ICD-10-CM | POA: Insufficient documentation

## 2018-05-03 DIAGNOSIS — Z881 Allergy status to other antibiotic agents status: Secondary | ICD-10-CM | POA: Insufficient documentation

## 2018-05-03 DIAGNOSIS — Z9049 Acquired absence of other specified parts of digestive tract: Secondary | ICD-10-CM | POA: Diagnosis not present

## 2018-05-03 DIAGNOSIS — I471 Supraventricular tachycardia: Secondary | ICD-10-CM | POA: Insufficient documentation

## 2018-05-03 DIAGNOSIS — Z888 Allergy status to other drugs, medicaments and biological substances status: Secondary | ICD-10-CM | POA: Diagnosis not present

## 2018-05-03 DIAGNOSIS — M1990 Unspecified osteoarthritis, unspecified site: Secondary | ICD-10-CM | POA: Diagnosis not present

## 2018-05-03 DIAGNOSIS — Z91012 Allergy to eggs: Secondary | ICD-10-CM | POA: Diagnosis not present

## 2018-05-03 DIAGNOSIS — G5600 Carpal tunnel syndrome, unspecified upper limb: Secondary | ICD-10-CM | POA: Insufficient documentation

## 2018-05-03 DIAGNOSIS — Z79899 Other long term (current) drug therapy: Secondary | ICD-10-CM | POA: Diagnosis not present

## 2018-05-03 DIAGNOSIS — Z882 Allergy status to sulfonamides status: Secondary | ICD-10-CM | POA: Diagnosis not present

## 2018-05-03 DIAGNOSIS — Z8249 Family history of ischemic heart disease and other diseases of the circulatory system: Secondary | ICD-10-CM | POA: Insufficient documentation

## 2018-05-03 DIAGNOSIS — Z9889 Other specified postprocedural states: Secondary | ICD-10-CM | POA: Diagnosis not present

## 2018-05-03 HISTORY — PX: SVT ABLATION: EP1225

## 2018-05-03 SURGERY — SVT ABLATION
Anesthesia: Monitor Anesthesia Care

## 2018-05-03 MED ORDER — ONDANSETRON HCL 4 MG/2ML IJ SOLN
INTRAMUSCULAR | Status: DC | PRN
  Administered 2018-05-03: 4 mg via INTRAVENOUS

## 2018-05-03 MED ORDER — PROPOFOL 500 MG/50ML IV EMUL: INTRAVENOUS | Status: DC | PRN

## 2018-05-03 MED ORDER — MIDAZOLAM HCL 2 MG/2ML IJ SOLN
INTRAMUSCULAR | Status: DC | PRN
  Administered 2018-05-03: 2 mg via INTRAVENOUS

## 2018-05-03 MED ORDER — SODIUM CHLORIDE 0.9 % IV SOLN
INTRAVENOUS | Status: DC
  Administered 2018-05-03: 06:00:00 via INTRAVENOUS

## 2018-05-03 MED ORDER — ONDANSETRON HCL 4 MG/2ML IJ SOLN: 4.0000 mg | Freq: Four times a day (QID) | INTRAMUSCULAR | Status: DC | PRN

## 2018-05-03 MED ORDER — BUPIVACAINE HCL (PF) 0.25 % IJ SOLN
INTRAMUSCULAR | Status: DC | PRN
  Administered 2018-05-03: 30 mL

## 2018-05-03 MED ORDER — BUPIVACAINE HCL (PF) 0.25 % IJ SOLN
INTRAMUSCULAR | Status: AC
  Filled 2018-05-03: qty 30

## 2018-05-03 MED ORDER — FENTANYL CITRATE (PF) 100 MCG/2ML IJ SOLN
INTRAMUSCULAR | Status: DC | PRN
  Administered 2018-05-03 (×2): 50 ug via INTRAVENOUS

## 2018-05-03 MED ORDER — SODIUM CHLORIDE 0.9% FLUSH: 3.0000 mL | Freq: Two times a day (BID) | INTRAVENOUS | Status: DC

## 2018-05-03 MED ORDER — ACETAMINOPHEN 325 MG PO TABS
650.0000 mg | ORAL_TABLET | ORAL | Status: DC | PRN
  Filled 2018-05-03: qty 2

## 2018-05-03 MED ORDER — PROPOFOL 500 MG/50ML IV EMUL
INTRAVENOUS | Status: DC | PRN
  Administered 2018-05-03: 30 ug/kg/min via INTRAVENOUS
  Administered 2018-05-03: 25 ug/kg/min via INTRAVENOUS
  Administered 2018-05-03: 40 ug/kg/min via INTRAVENOUS

## 2018-05-03 MED ORDER — HYDROCODONE-ACETAMINOPHEN 5-325 MG PO TABS: 1.0000 | ORAL_TABLET | ORAL | Status: DC | PRN

## 2018-05-03 MED ORDER — SODIUM CHLORIDE 0.9% FLUSH: 3.0000 mL | INTRAVENOUS | Status: DC | PRN

## 2018-05-03 MED ORDER — ISOPROTERENOL HCL 0.2 MG/ML IJ SOLN
INTRAVENOUS | Status: DC | PRN
  Administered 2018-05-03: 4 ug/min via INTRAVENOUS

## 2018-05-03 MED ORDER — ISOPROTERENOL HCL 0.2 MG/ML IJ SOLN
INTRAMUSCULAR | Status: AC
  Filled 2018-05-03: qty 5

## 2018-05-03 MED ORDER — SODIUM CHLORIDE 0.9 % IV SOLN: 250.0000 mL | INTRAVENOUS | Status: DC | PRN

## 2018-05-03 SURGICAL SUPPLY — 12 items
BLANKET WARM UNDERBOD FULL ACC (MISCELLANEOUS) ×2 IMPLANT
CATH EZ STEER NAV 4MM D-F CUR (ABLATOR) ×2 IMPLANT
CATH JOSEPH QUAD ALLRED 6F REP (CATHETERS) ×4 IMPLANT
CATH WEB BIDIR CS D-F NONAUTO (CATHETERS) ×2 IMPLANT
PACK EP LATEX FREE (CUSTOM PROCEDURE TRAY) ×3
PACK EP LF (CUSTOM PROCEDURE TRAY) ×1 IMPLANT
PAD PRO RADIOLUCENT 2001M-C (PAD) ×3 IMPLANT
PATCH CARTO3 (PAD) ×2 IMPLANT
SHEATH PINNACLE 6F 10CM (SHEATH) ×2 IMPLANT
SHEATH PINNACLE 7F 10CM (SHEATH) ×2 IMPLANT
SHEATH PINNACLE 8F 10CM (SHEATH) ×2 IMPLANT
SHIELD RADPAD SCOOP 12X17 (MISCELLANEOUS) ×3 IMPLANT

## 2018-05-03 NOTE — Anesthesia Procedure Notes (Signed)
Procedure Name: MAC Date/Time: 05/03/2018 7:45 AM Performed by: Leonor Liv, CRNA Pre-anesthesia Checklist: Patient identified, Emergency Drugs available, Suction available, Patient being monitored and Timeout performed Patient Re-evaluated:Patient Re-evaluated prior to induction Oxygen Delivery Method: Simple face mask Placement Confirmation: positive ETCO2

## 2018-05-03 NOTE — Interval H&P Note (Signed)
History and Physical Interval Note:  05/03/2018 7:23 AM  Shelly [name]  has presented today for surgery, with the diagnosis of svt  The various methods of treatment have been discussed with the patient and family. After consideration of risks, benefits and other options for treatment, the patient has consented to  Procedure(s): SVT ABLATION (N/A) as a surgical intervention .  The patient's history has been reviewed, patient examined, no change in status, stable for surgery.  I have reviewed the patient's chart and labs.  Questions were answered to the patient's satisfaction.     Thompson Grayer

## 2018-05-03 NOTE — Transfer of Care (Signed)
Immediate Anesthesia Transfer of Care Note  Patient: Shelly [name]  Procedure(s) Performed: SVT ABLATION (N/A )  Patient Location: Cath Lab  Anesthesia Type:MAC  Level of Consciousness: awake, alert  and oriented  Airway & Oxygen Therapy: Patient Spontanous Breathing and Patient connected to face mask oxygen  Post-op Assessment: Report given to RN, Post -op Vital signs reviewed and stable and Patient moving all extremities  Post vital signs: Reviewed and stable  Last Vitals:  Vitals Value Taken Time  BP 113/66 05/03/2018  9:15 AM  Temp    Pulse 63 05/03/2018  9:16 AM  Resp 9 05/03/2018  9:16 AM  SpO2 99 % 05/03/2018  9:16 AM  Vitals shown include unvalidated device data.  Last Pain:  Vitals:   05/03/18 0917  TempSrc:   PainSc: 0-No pain      Patients Stated Pain Goal: 3 (55/37/48 2707)  Complications: No apparent anesthesia complications

## 2018-05-03 NOTE — Progress Notes (Signed)
Site area: Right groin a 6, 7, and 8 french venous sheath was removed  Site Prior to Removal:  Level 0  Pressure Applied For 20 MINUTES    Bedrest Beginning at 0945am  Manual:   Yes.    Patient Status During Pull:  stable  Post Pull Groin Site:  Level 0  Post Pull Instructions Given:  Yes.    Post Pull Pulses Present:  Yes.    Dressing Applied:  Yes.    Comments:  VS remain stable

## 2018-05-03 NOTE — Anesthesia Postprocedure Evaluation (Signed)
Anesthesia Post Note  Patient: Shelly [name]  Procedure(s) Performed: SVT ABLATION (N/A )     Patient location during evaluation: PACU Anesthesia Type: MAC Level of consciousness: awake and alert Pain management: pain level controlled Vital Signs Assessment: post-procedure vital signs reviewed and stable Respiratory status: spontaneous breathing, nonlabored ventilation, respiratory function stable and patient connected to nasal cannula oxygen Cardiovascular status: stable and blood pressure returned to baseline Postop Assessment: no apparent nausea or vomiting Anesthetic complications: no    Last Vitals:  Vitals:   05/03/18 1200 05/03/18 1300  BP: 125/61 117/71  Pulse: (!) 55 62  Resp: 13 11  Temp:    SpO2: 98% 98%    Last Pain:  Vitals:   05/03/18 1328  TempSrc:   PainSc: 0-No pain                 Tiajuana Amass

## 2018-05-03 NOTE — Discharge Instructions (Signed)
Venogram, Care After This sheet gives you information about how to care for yourself after your procedure. Your health care provider may also give you more specific instructions. If you have problems or questions, contact your health care provider. What can I expect after the procedure? After the procedure, it is common to have bruising and tenderness at the catheter insertion area. Follow these instructions at home: Insertion site care  Follow instructions from your health care provider about how to take care of your insertion site. Make sure you: ? Wash your hands with soap and water before you change your bandage (dressing). If soap and water are not available, use hand sanitizer. ? Change your dressing as told by your health care provider. ? Leave stitches (sutures), skin glue, or adhesive strips in place. These skin closures may need to stay in place for 2 weeks or longer. If adhesive strip edges start to loosen and curl up, you may trim the loose edges. Do not remove adhesive strips completely unless your health care provider tells you to do that.  Do not take baths, swim, or use a hot tub until your health care provider approves.  You may shower 24-48 hours after the procedure or as told by your health care provider. ? Gently wash the site with plain soap and water. ? Pat the area dry with a clean towel. ? Do not rub the site. This may cause bleeding.  Do not apply powder or lotion to the site. Keep the site clean and dry.  Check your insertion site every day for signs of infection. Check for: ? Redness, swelling, or pain. ? Fluid or blood. ? Warmth. ? Pus or a bad smell. Activity  Rest as told by your health care provider, usually for 1-2 days.  Do not lift anything that is heavier than 10 lbs. (4.5 kg) or as told by your health care provider.  Do not drive for 24 hours if you were given a medicine to help you relax (sedative).  Do not drive or use heavy machinery while  taking prescription pain medicine. General instructions  Return to your normal activities as told by your health care provider, usually in about a week. Ask your health care provider what activities are safe for you.  If the catheter site starts bleeding, lie flat and put pressure on the site. If the bleeding does not stop, get help right away. This is a medical emergency.  Drink enough fluid to keep your urine clear or pale yellow. This helps flush the contrast dye from your body.  Take over-the-counter and prescription medicines only as told by your health care provider.  Keep all follow-up visits as told by your health care provider. This is important. Contact a health care provider if:  You have a fever or chills.  You have redness, swelling, or pain around your insertion site.  You have fluid or blood coming from your insertion site.  The insertion site feels warm to the touch.  You have pus or a bad smell coming from your insertion site.  You have bruising around the insertion site.  You notice blood collecting in the tissue around the catheter site (hematoma). The hematoma may be painful to the touch. Get help right away if:  You have severe pain at the catheter insertion area.  The catheter insertion area swells very fast.  The catheter insertion area is bleeding, and the bleeding does not stop when you hold steady pressure on the area.  The area near or just beyond the catheter insertion site becomes pale, cool, tingly, or numb. These symptoms may represent a serious problem that is an emergency. Do not wait to see if the symptoms will go away. Get medical help right away. Call your local emergency services (911 in the [place] Do not drive yourself to the hospital. Summary  After the procedure, it is common to have bruising and tenderness at the catheter insertion area.  After the procedure, it is important to rest and drink plenty of fluids.  Do not take baths,  swim, or use a hot tub until your health care provider says it is okay to do so. You may shower 24-48 hours after the procedure or as told by your health care provider.  If the catheter site starts bleeding, lie flat and put pressure on the site. If the bleeding does not stop, get help right away. This is a medical emergency. This information is not intended to replace advice given to you by your health care provider. Make sure you discuss any questions you have with your health care provider. Document Released: 02/19/2005 Document Revised: 07/08/2016 Document Reviewed: 07/08/2016 Elsevier Interactive Patient Education  2018 [employer].  Cardiac Ablation, Care After This sheet gives you information about how to care for yourself after your procedure. Your health care provider may also give you more specific instructions. If you have problems or questions, contact your health care provider. What can I expect after the procedure? After the procedure, it is common to have:  Bruising around your puncture site.  Tenderness around your puncture site.  Skipped heartbeats.  Tiredness (fatigue).  Follow these instructions at home: Puncture site care  Follow instructions from your health care provider about how to take care of your puncture site. Make sure you: ? Wash your hands with soap and water before you change your bandage (dressing). If soap and water are not available, use hand sanitizer. ? Change your dressing as told by your health care provider. ? Leave stitches (sutures), skin glue, or adhesive strips in place. These skin closures may need to stay in place for up to 2 weeks. If adhesive strip edges start to loosen and curl up, you may trim the loose edges. Do not remove adhesive strips completely unless your health care provider tells you to do that.  Check your puncture site every day for signs of infection. Check for: ? Redness, swelling, or pain. ? Fluid or blood. If your  puncture site starts to bleed, lie down on your back, apply firm pressure to the area, and contact your health care provider. ? Warmth. ? Pus or a bad smell. Driving  Ask your health care provider when it is safe for you to drive again after the procedure.  Do not drive or use heavy machinery while taking prescription pain medicine.  Do not drive for 24 hours if you were given a medicine to help you relax (sedative) during your procedure. Activity  Avoid activities that take a lot of effort for at least 3 days after your procedure.  Do not lift anything that is heavier than 10 lb (4.5 kg), or the limit that you are told, until your health care provider says that it is safe.  Return to your normal activities as told by your health care provider. Ask your health care provider what activities are safe for you. General instructions  Take over-the-counter and prescription medicines only as told by your health care provider.  Do  not use any products that contain nicotine or tobacco, such as cigarettes and e-cigarettes. If you need help quitting, ask your health care provider.  Do not take baths, swim, or use a hot tub until your health care provider approves.  Do not drink alcohol for 24 hours after your procedure.  Keep all follow-up visits as told by your health care provider. This is important. Contact a health care provider if:  You have redness, mild swelling, or pain around your puncture site.  You have fluid or blood coming from your puncture site that stops after applying firm pressure to the area.  Your puncture site feels warm to the touch.  You have pus or a bad smell coming from your puncture site.  You have a fever.  You have chest pain or discomfort that spreads to your neck, jaw, or arm.  You are sweating a lot.  You feel nauseous.  You have a fast or irregular heartbeat.  You have shortness of breath.  You are dizzy or light-headed and feel the need to lie  down.  You have pain or numbness in the arm or leg closest to your puncture site. Get help right away if:  Your puncture site suddenly swells.  Your puncture site is bleeding and the bleeding does not stop after applying firm pressure to the area. These symptoms may represent a serious problem that is an emergency. Do not wait to see if the symptoms will go away. Get medical help right away. Call your local emergency services (911 in the [place] Do not drive yourself to the hospital. Summary  After the procedure, it is normal to have bruising and tenderness at the puncture site in your groin, neck, or forearm.  Check your puncture site every day for signs of infection.  Get help right away if your puncture site is bleeding and the bleeding does not stop after applying firm pressure to the area. This is a medical emergency. This information is not intended to replace advice given to you by your health care provider. Make sure you discuss any questions you have with your health care provider. Document Released: 11/12/2016 Document Revised: 11/12/2016 Document Reviewed: 11/12/2016 Elsevier Interactive Patient Education  2018 [employer].

## 2018-05-04 ENCOUNTER — Encounter (HOSPITAL_COMMUNITY): Payer: Self-pay | Admitting: Internal Medicine

## 2018-05-05 ENCOUNTER — Other Ambulatory Visit: Payer: Self-pay | Admitting: *Deleted

## 2018-05-05 MED ORDER — METOPROLOL SUCCINATE ER 25 MG PO TB24: 12.5000 mg | ORAL_TABLET | Freq: Two times a day (BID) | ORAL | 1 refills | Status: DC

## 2018-06-01 ENCOUNTER — Encounter: Payer: Self-pay | Admitting: Internal Medicine

## 2018-06-01 ENCOUNTER — Ambulatory Visit (INDEPENDENT_AMBULATORY_CARE_PROVIDER_SITE_OTHER): Payer: Managed Care, Other (non HMO) | Admitting: Internal Medicine

## 2018-06-01 VITALS — BP 136/84 | HR 65 | Ht 66.0 in | Wt 233.0 lb

## 2018-06-01 DIAGNOSIS — I471 Supraventricular tachycardia: Secondary | ICD-10-CM

## 2018-06-01 NOTE — Progress Notes (Signed)
PCP: Curly Rim, MD Primary Cardiologist: Dr Bronson Ing Primary EP: Dr Rayann Heman  Shelly [name] is a 57 y.o. female who presents today for routine electrophysiology followup.  Since last being seen in our clinic, the patient reports doing very well.  She did not have inducible tachycardia with her EP study recently.  She had nonsustained atach only which is likely her clinical arrhythmia and was not sustained long enough for mapping/ ablation.  She has done well since, without procedure related complications. She has had only a "couple" of brief palpitations (30 seconds or less).  No sustained events. Today, she denies symptoms of palpitations, chest pain, shortness of breath,  lower extremity edema, dizziness, presyncope, or syncope.  The patient is otherwise without complaint today.   Past Medical History:  Diagnosis Date  . Acid reflux   . Adverse effect of general anesthetic    shivering recently-had to have demerol  . Arthritis   . Carpal tunnel syndrome   . Focal nodular hyperplasia of liver surgery in 2004   partial hepactectomy  . GERD (gastroesophageal reflux disease)   . Hypothyroidism   . PONV (postoperative nausea and vomiting)   . SVT (supraventricular tachycardia) ([place]) 03/19/2018   documented with EKG in epic.  . Wears glasses    Past Surgical History:  Procedure Laterality Date  . CARPAL TUNNEL RELEASE     rt  . CARPAL TUNNEL RELEASE Left 01/12/2013   Procedure: CARPAL TUNNEL RELEASE;  Surgeon: Cammie Sickle., MD;  Location: [place];  Service: Orthopedics;  Laterality: Left;  . CARPOMETACARPEL SUSPENSION PLASTY Left 07/24/2014   Procedure: LEFT THUMB CARPOMETACARPEL ([place]) ARTHROPLASTY GARCIA ELIAS ;  Surgeon: Leanora Cover, MD;  Location: [place];  Service: Orthopedics;  Laterality: Left;  . CHOLECYSTECTOMY  2004   liver resection benign tumor with GB  . COLONOSCOPY  03/05/2011   Procedure: COLONOSCOPY;  Surgeon: Rogene Houston, MD;  [facility];  Service: Endoscopy;  Laterality: N/A;  . DORSAL COMPARTMENT RELEASE Left 01/12/2013   Procedure: RELEASE DORSAL COMPARTMENT (DEQUERVAIN);  Surgeon: Cammie Sickle., MD;  Location: [facility];  Service: Orthopedics;  Laterality: Left;  . ESOPHAGOGASTRODUODENOSCOPY  03/05/2011   Procedure: ESOPHAGOGASTRODUODENOSCOPY (EGD);  Surgeon: Rogene Houston, MD;  [facility];  Service: Endoscopy;  Laterality: N/A;  . ESOPHAGOGASTRODUODENOSCOPY N/A 05/14/2016   Procedure: ESOPHAGOGASTRODUODENOSCOPY (EGD);  Surgeon: Rogene Houston, MD;  [facility];  Service: Endoscopy;  Laterality: N/A;  300  . FRACTURE SURGERY  2011   rt ankle  . PARTIAL HYSTERECTOMY    . STERIOD INJECTION Left 01/12/2013   Procedure: INJECTION LEFT THUMB [place] JOINT;  Surgeon: Cammie Sickle., MD;  Location: [place];  Service: Orthopedics;  Laterality: Left;  . SVT ABLATION N/A 05/03/2018   Procedure: SVT ABLATION;  Surgeon: Thompson Grayer, MD;  Location: [place] [facility];  Service: Cardiovascular;  Laterality: N/A;  . THYROIDECTOMY, PARTIAL    . TIBIA HARDWARE REMOVAL  2011   rt ankle   . TRIGGER FINGER RELEASE Left 01/12/2013   Procedure: RELEASE TRIGGER FINGER/A-1 PULLEY;  Surgeon: Cammie Sickle., MD;  Location: [place];  Service: Orthopedics;  Laterality: Left;    ROS- all systems are reviewed and negatives except as per HPI above  Current Outpatient Medications  Medication Sig Dispense Refill  . ALPRAZolam (XANAX) 0.5 MG tablet Take 0.25 mg by mouth  daily as needed for anxiety.    Marland Kitchen esomeprazole (NEXIUM) 20 MG capsule Take 20 mg by mouth daily at 12 noon.    Marland Kitchen levothyroxine (SYNTHROID, LEVOTHROID) 75 MCG tablet Take 75 mcg by mouth daily before breakfast.    . metoprolol succinate (TOPROL-XL) 25 MG 24 hr tablet Take 0.5 tablets (12.5 mg total) by mouth 2 (two) times daily. 90 tablet 1  . OVER THE COUNTER  MEDICATION Apply 1 application topically daily as needed (knee pain). CBD Cream    . Secukinumab (COSENTYX SENSOREADY 300 DOSE) 150 MG/ML SOAJ Take 300 mg by mouth every 30 (thirty) days. Takes injection once a month    . Topiramate ER (TROKENDI XR) 50 MG CP24 Take 50 mg by mouth every evening.      No current facility-administered medications for this visit.     Physical Exam: Vitals:   06/01/18 1114  BP: 136/84  Pulse: 65  SpO2: 99%  Weight: 233 lb (105.7 kg)  Height: 5\' 6"  (1.676 m)    GEN- The patient is well appearing, alert and oriented x 3 today.   Head- normocephalic, atraumatic Eyes-  Sclera clear, conjunctiva pink Ears- hearing intact Oropharynx- clear Lungs- Clear to ausculation bilaterally, normal work of breathing Heart- Regular rate and rhythm, no murmurs, rubs or gallops, PMI not laterally displaced GI- soft, NT, ND, + BS Extremities- no clubbing, cyanosis, or edema  Wt Readings from Last 3 Encounters:  06/01/18 233 lb (105.7 kg)  05/03/18 220 lb (99.8 kg)  04/15/18 232 lb 6.4 oz (105.4 kg)    EKG tracing ordered today is personally reviewed and shows sinus rhythm, 65 bpm, PR 170 msec, QRS 86 msec, QTc 463 msec  Assessment and Plan:  1. Ectopic atrial tachycardia We not sustained on recent EP study She does not wish to change medicine at this time.  She prefers a conservative approach with lifestyle modification.  We discussed this at length today.  Thompson Grayer MD, [facility] 06/01/2018 11:36 AM

## 2018-06-01 NOTE — Patient Instructions (Addendum)
Medication Instructions:  Your physician recommends that you continue on your current medications as directed. Please refer to the Current Medication list given to you today.  Labwork: None ordered.  Testing/Procedures: None ordered.  Follow-Up: Your physician wants you to follow-up in: one year with Dr. Allred.      Any Other Special Instructions Will Be Listed Below (If Applicable).  If you need a refill on your cardiac medications before your next appointment, please call your pharmacy.   

## 2018-10-31 ENCOUNTER — Other Ambulatory Visit: Payer: Self-pay | Admitting: Cardiovascular Disease

## 2019-03-23 DIAGNOSIS — M25562 Pain in left knee: Secondary | ICD-10-CM | POA: Insufficient documentation

## 2019-04-28 ENCOUNTER — Other Ambulatory Visit: Payer: Self-pay | Admitting: Internal Medicine

## 2019-05-16 ENCOUNTER — Other Ambulatory Visit: Payer: Self-pay

## 2019-05-16 ENCOUNTER — Encounter: Payer: Self-pay | Admitting: Physical Therapy

## 2019-05-16 ENCOUNTER — Ambulatory Visit: Payer: Managed Care, Other (non HMO) | Attending: Orthopedic Surgery | Admitting: Physical Therapy

## 2019-05-16 DIAGNOSIS — M25562 Pain in left knee: Secondary | ICD-10-CM | POA: Diagnosis present

## 2019-05-16 NOTE — Therapy (Signed)
[facility] [place], [place], [address] Phone: [phone]   Fax:  [phone]  Physical Therapy Evaluation  Patient Details  Name: Shelly [name] MRN: [id number] Date of Birth: Dec 02, 1960 Referring Provider (PT): Marquette Saa MD.   Encounter Date: 05/16/2019  PT End of Session - 05/16/19 1511    Visit Number  1    Number of Visits  12    Date for PT Re-Evaluation  06/27/19    Authorization Time Period  PROGRESS NOTE AT 10TH VISIT.  KX MODIFIER AFTER 15 VISITS.    PT Start Time  0145    PT Stop Time  0243    PT Time Calculation (min)  58 min    Activity Tolerance  Patient tolerated treatment well    Behavior During Therapy  WFL for tasks assessed/performed       Past Medical History:  Diagnosis Date  . Acid reflux   . Adverse effect of general anesthetic    shivering recently-had to have demerol  . Arthritis   . Carpal tunnel syndrome   . Focal nodular hyperplasia of liver surgery in 2004   partial hepactectomy  . GERD (gastroesophageal reflux disease)   . Hypothyroidism   . PONV (postoperative nausea and vomiting)   . SVT (supraventricular tachycardia) ([place]) 03/19/2018   documented with EKG in epic.  . Wears glasses     Past Surgical History:  Procedure Laterality Date  . CARPAL TUNNEL RELEASE     rt  . CARPAL TUNNEL RELEASE Left 01/12/2013   Procedure: CARPAL TUNNEL RELEASE;  Surgeon: Cammie Sickle., MD;  Location: [place];  Service: Orthopedics;  Laterality: Left;  . CARPOMETACARPEL SUSPENSION PLASTY Left 07/24/2014   Procedure: LEFT THUMB CARPOMETACARPEL ([place]) ARTHROPLASTY GARCIA ELIAS ;  Surgeon: Leanora Cover, MD;  Location: [place];  Service: Orthopedics;  Laterality: Left;  . CHOLECYSTECTOMY  2004   liver resection benign tumor with GB  . COLONOSCOPY  03/05/2011   Procedure: COLONOSCOPY;  Surgeon: Rogene Houston, MD;  [facility];  Service:  Endoscopy;  Laterality: N/A;  . DORSAL COMPARTMENT RELEASE Left 01/12/2013   Procedure: RELEASE DORSAL COMPARTMENT (DEQUERVAIN);  Surgeon: Cammie Sickle., MD;  Location: [name];  Service: Orthopedics;  Laterality: Left;  . ESOPHAGOGASTRODUODENOSCOPY  03/05/2011   Procedure: ESOPHAGOGASTRODUODENOSCOPY (EGD);  Surgeon: Rogene Houston, MD;  [facility];  Service: Endoscopy;  Laterality: N/A;  . ESOPHAGOGASTRODUODENOSCOPY N/A 05/14/2016   Procedure: ESOPHAGOGASTRODUODENOSCOPY (EGD);  Surgeon: Rogene Houston, MD;  [facility];  Service: Endoscopy;  Laterality: N/A;  300  . FRACTURE SURGERY  2011   rt ankle  . PARTIAL HYSTERECTOMY    . STERIOD INJECTION Left 01/12/2013   Procedure: INJECTION LEFT THUMB [place] JOINT;  Surgeon: Cammie Sickle., MD;  Location: [place];  Service: Orthopedics;  Laterality: Left;  . SVT ABLATION N/A 05/03/2018   Procedure: SVT ABLATION;  Surgeon: Thompson Grayer, MD;  Location: [place] [facility];  Service: Cardiovascular;  Laterality: N/A;  . THYROIDECTOMY, PARTIAL    . TIBIA HARDWARE REMOVAL  2011   rt ankle   . TRIGGER FINGER RELEASE Left 01/12/2013   Procedure: RELEASE TRIGGER FINGER/A-1 PULLEY;  Surgeon: Cammie Sickle., MD;  Location: [place];  Service: Orthopedics;  Laterality: Left;    There were no vitals filed for this visit.   Subjective Assessment - 05/16/19  1539    Subjective  COVID-19 screen performed prior to patient entering clinic. The patient presents to the clinic today with c/o left calf and ankle pain.  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  She states she was doing great and was wearing a knee immobilizer.  She bent forward to pick something up when she felt an intense stabbing pain oin her left calf such that she screamed in pain.  She had a Doppler to r/o a blood clot.  Her pain is a 6/10 at rest today but will increase to  much higher levels with walking and standing.    Pertinent History  5 knee surgeries (bilateral). SVT, CTS, PVNS.    How long can you stand comfortably?  10 minutes.    How long can you walk comfortably?  Short distances around house.    Patient Stated Goals  Get out of pain.    Currently in Pain?  Yes    Pain Score  6     Pain Location  Calf    Pain Orientation  Left    Pain Descriptors / Indicators  Aching    Pain Type  Acute pain    Pain Onset  1 to 4 weeks ago    Pain Frequency  Constant    Aggravating Factors   See above.    Pain Relieving Factors  See above.         [facility] PT Assessment - 05/16/19 0001      Assessment   Medical Diagnosis  Left calf pain.    Referring Provider (PT)  Marquette Saa MD.    Onset Date/Surgical Date  --   04/20/19(surgery date).     Precautions   Precautions  None      Restrictions   Weight Bearing Restrictions  No      Balance Screen   Has the patient fallen in the past 6 months  No    Has the patient had a decrease in activity level because of a fear of falling?   Yes    Is the patient reluctant to leave their home because of a fear of falling?   Yes      [place] residence      Prior Function   Level of Independence  Independent      Observation/Other Assessments   Observations  Steri-Strips in region of left popliteal fossa. Min+ left ankle edema.      ROM / Strength   AROM / PROM / Strength  AROM;Strength      AROM   Overall AROM Comments  Full left knee extension and flexion performed slowly to 100 degrees.  Active left ankle dorsiflexion -20 degrees from neutral due to pain.      Palpation   Palpation comment  Very tender to palpation over mid-lateral left calf and extreme tenderness and hypersensitivity to touch at lateral distal Achilles attachment on the calcaneus.      Special Tests   Other special tests  (-) Thompson test.      Ambulation/Gait   Gait Comments  Left knee held in  full extension and essentially a flat-footed gait pattern.                Objective measurements completed on examination: See above findings.      [facility] Adult PT Treatment/Exercise - 05/16/19 0001      Modalities   Modalities  Electrical Stimulation;Moist Heat      Moist Heat Therapy   Number Minutes Moist Heat  20 Minutes    Moist Heat Location  --   Left calf.     Acupuncturist Location  --   Left calf/Achilles...mid-calf and lateral/distal Achilles.   Electrical Stimulation Action  IFC    Electrical Stimulation Parameters  80-150 Hz x 20 minutes.                  PT Long Term Goals - 05/16/19 1625      PT LONG TERM GOAL #1   Title  Independent with a HEP.    Time  6    Period  Weeks    Status  New      PT LONG TERM GOAL #2   Title  Increase left ankle dorsiflexion to 6- 8 degrees to normalize the patient's gait pattern.    Time  6    Period  Weeks    Status  New      PT LONG TERM GOAL #3   Title  Walk with normal gait pattern.    Time  6    Period  Weeks    Status  New      PT LONG TERM GOAL #4   Title  Perform ADL's with pain not > 3/10.    Time  6    Period  Weeks    Status  New             Plan - 05/16/19 1553    Clinical Impression Statement  The patient presents to OPPT with c/o left calf and Achilles pain due to a recent injury when she bent over with her left knee immobilizer on (due to a recent surgery to remove cysts from her popliteal fossa region).  She exhibits extreme tenderness in the region of her left mid-lateral calf and distal lateral Achilles at the calcaneal attachment.  Her dorsiflexion is very limited due to pain.  Her ankle exhibits some swelling as well that she states worsens the longer she is up.  Her gait is antalgic in nature.  the patient will benefit from skilled PT intervention to address deficits and pain.    Personal Factors and Comorbidities  Comorbidity  1;Comorbidity 2;Other    Comorbidities  5 knee surgeries (bilateral). SVT, CTS, PVNS.    Examination-Activity Limitations  Locomotion Level;Stand;Squat    Examination-Participation Restrictions  Other    Stability/Clinical Decision Making  Evolving/Moderate complexity    Clinical Decision Making  Moderate    Rehab Potential  Good    PT Frequency  2x / week    PT Duration  6 weeks    PT Treatment/Interventions  ADLs/Self Care Home Management;Cryotherapy;Electrical Stimulation;Gait training;Ultrasound;Moist Heat;Stair training;Functional mobility training;Iontophoresis 4mg /ml Dexamethasone;Therapeutic activities;Therapeutic exercise;Manual techniques;Patient/family education;Passive range of motion;Vasopneumatic Device    PT Next Visit Plan  Please start gentle.  HMP/e'stim, U/S, gentle STW/M, ankel pumps, seated Rockerboard. Instruct in short duration ice massage.    Consulted and Agree with Plan of Care  Patient       Patient will benefit from skilled therapeutic intervention in order to improve the following deficits and impairments:  Abnormal gait, Decreased activity tolerance, Decreased strength, Decreased range of motion, Difficulty walking, Increased edema, Pain  Visit Diagnosis: Acute pain of left knee - Plan: PT plan of care cert/re-cert     Problem List Patient Active Problem List   Diagnosis Date Noted  . SVT (  supraventricular tachycardia) ([place]) 03/19/2018  . Hypothyroidism 03/19/2018  . Hypokalemia 03/19/2018  . Hyperglycemia 03/19/2018  . Barretts esophagus 03/25/2016  . Dysphagia 04/07/2012  . Fibromyalgia 04/07/2012  . GERD (gastroesophageal reflux disease) 04/07/2012  . Hypothyroid 04/07/2012  . Shingles 04/07/2012    Lyon Dumont, [place] MPT 05/16/2019, 4:28 PM  [facility] [address] [place], [place], [address] Phone: [phone]   Fax:  [phone]  Name: JOSEPHYNE CARLONI MRN: [id number] Date of Birth:  July 18, 1961

## 2019-05-18 ENCOUNTER — Ambulatory Visit: Payer: Managed Care, Other (non HMO) | Attending: Orthopedic Surgery | Admitting: *Deleted

## 2019-05-18 ENCOUNTER — Other Ambulatory Visit: Payer: Self-pay

## 2019-05-18 DIAGNOSIS — M25562 Pain in left knee: Secondary | ICD-10-CM | POA: Diagnosis not present

## 2019-05-18 NOTE — Therapy (Signed)
[facility] [place], [place], [address] Phone: [phone]   Fax:  [phone]  Physical Therapy Treatment  Patient Details  Name: Shelly [name] MRN: [id number] Date of Birth: 07/31/1961 Referring Provider (PT): Marquette Saa MD.   Encounter Date: 05/18/2019  PT End of Session - 05/18/19 1357    Visit Number  2    Number of Visits  12    Date for PT Re-Evaluation  06/27/19    Authorization Time Period  PROGRESS NOTE AT 10TH VISIT.  KX MODIFIER AFTER 15 VISITS.    PT Start Time  1349    PT Stop Time  1437    PT Time Calculation (min)  48 min       Past Medical History:  Diagnosis Date  . Acid reflux   . Adverse effect of general anesthetic    shivering recently-had to have demerol  . Arthritis   . Carpal tunnel syndrome   . Focal nodular hyperplasia of liver surgery in 2004   partial hepactectomy  . GERD (gastroesophageal reflux disease)   . Hypothyroidism   . PONV (postoperative nausea and vomiting)   . SVT (supraventricular tachycardia) ([place]) 03/19/2018   documented with EKG in epic.  . Wears glasses     Past Surgical History:  Procedure Laterality Date  . CARPAL TUNNEL RELEASE     rt  . CARPAL TUNNEL RELEASE Left 01/12/2013   Procedure: CARPAL TUNNEL RELEASE;  Surgeon: Cammie Sickle., MD;  Location: [place];  Service: Orthopedics;  Laterality: Left;  . CARPOMETACARPEL SUSPENSION PLASTY Left 07/24/2014   Procedure: LEFT THUMB CARPOMETACARPEL ([place]) ARTHROPLASTY GARCIA ELIAS ;  Surgeon: Leanora Cover, MD;  Location: [place];  Service: Orthopedics;  Laterality: Left;  . CHOLECYSTECTOMY  2004   liver resection benign tumor with GB  . COLONOSCOPY  03/05/2011   Procedure: COLONOSCOPY;  Surgeon: Rogene Houston, MD;  [facility];  Service: Endoscopy;  Laterality: N/A;  . DORSAL COMPARTMENT RELEASE Left 01/12/2013   Procedure: RELEASE DORSAL COMPARTMENT (DEQUERVAIN);   Surgeon: Cammie Sickle., MD;  Location: [facility];  Service: Orthopedics;  Laterality: Left;  . ESOPHAGOGASTRODUODENOSCOPY  03/05/2011   Procedure: ESOPHAGOGASTRODUODENOSCOPY (EGD);  Surgeon: Rogene Houston, MD;  [facility];  Service: Endoscopy;  Laterality: N/A;  . ESOPHAGOGASTRODUODENOSCOPY N/A 05/14/2016   Procedure: ESOPHAGOGASTRODUODENOSCOPY (EGD);  Surgeon: Rogene Houston, MD;  [facility];  Service: Endoscopy;  Laterality: N/A;  300  . FRACTURE SURGERY  2011   rt ankle  . PARTIAL HYSTERECTOMY    . STERIOD INJECTION Left 01/12/2013   Procedure: INJECTION LEFT THUMB [place] JOINT;  Surgeon: Cammie Sickle., MD;  Location: [place];  Service: Orthopedics;  Laterality: Left;  . SVT ABLATION N/A 05/03/2018   Procedure: SVT ABLATION;  Surgeon: Thompson Grayer, MD;  Location: [place] [facility];  Service: Cardiovascular;  Laterality: N/A;  . THYROIDECTOMY, PARTIAL    . TIBIA HARDWARE REMOVAL  2011   rt ankle   . TRIGGER FINGER RELEASE Left 01/12/2013   Procedure: RELEASE TRIGGER FINGER/A-1 PULLEY;  Surgeon: Cammie Sickle., MD;  Location: [place];  Service: Orthopedics;  Laterality: Left;    There were no vitals filed for this visit.  Subjective Assessment - 05/18/19 1355    Subjective  COVID19 screening performed prior to entering the building. LT achilles and mid-calf 5/10. Very sensitive  Pertinent History  5 knee surgeries (bilateral). SVT, CTS, PVNS.    How long can you stand comfortably?  10 minutes.    How long can you walk comfortably?  Short distances around house.    Patient Stated Goals  Get out of pain.    Currently in Pain?  Yes    Pain Score  5     Pain Orientation  Left    Pain Descriptors / Indicators  Aching;Sore;Shooting    Pain Type  Acute pain    Pain Onset  1 to 4 weeks ago                       [facility] Adult PT Treatment/Exercise - 05/18/19 0001      Modalities    Modalities  Electrical Stimulation;Moist Heat      Moist Heat Therapy   Number Minutes Moist Heat  15 Minutes    Moist Heat Location  --   Left calf.     Acupuncturist Location  --   Left calf/Achilles...mid-calf and lateral/distal Achilles.   Electrical Stimulation Action  IFC    Electrical Stimulation Parameters  80-150hz  x 15 mins    Electrical Stimulation Goals  Pain      Manual Therapy   Manual Therapy  Soft tissue mobilization    Soft tissue mobilization  Gentle STW to LT upper calf and into mid calf as tolerated.  dry cloth used to help desensitize mid-calf into achilles                  PT Long Term Goals - 05/16/19 1625      PT LONG TERM GOAL #1   Title  Independent with a HEP.    Time  6    Period  Weeks    Status  New      PT LONG TERM GOAL #2   Title  Increase left ankle dorsiflexion to 6- 8 degrees to normalize the patient's gait pattern.    Time  6    Period  Weeks    Status  New      PT LONG TERM GOAL #3   Title  Walk with normal gait pattern.    Time  6    Period  Weeks    Status  New      PT LONG TERM GOAL #4   Title  Perform ADL's with pain not > 3/10.    Time  6    Period  Weeks    Status  New            Plan - 05/18/19 1432    Clinical Impression Statement  Pt arrived today doing fair, but c/o a lot of hypersensitivity and some shooting pain in LT mid-calf to achilles. [place] was performed to upper-calf right above painful area due to mid-calf hypersensitivity. Light STW attempted along lower calf and achilles as well as towel rub to desensitize lowerleg, but very sensitive.Marland Kitchen HMP and IFC tolerated very well    Comorbidities  5 knee surgeries (bilateral). SVT, CTS, PVNS.    Stability/Clinical Decision Making  Evolving/Moderate complexity    Rehab Potential  Good    PT Frequency  2x / week    PT Duration  6 weeks    PT Treatment/Interventions  ADLs/Self Care Home Management;Cryotherapy;Electrical  Stimulation;Gait training;Ultrasound;Moist Heat;Stair training;Functional mobility training;Iontophoresis 4mg /ml Dexamethasone;Therapeutic activities;Therapeutic exercise;Manual techniques;Patient/family education;Passive range of motion;Vasopneumatic Device    PT Next Visit  Plan  Please start gentle.  HMP/e'stim, U/S, gentle STW/M, ankel pumps, seated Rockerboard. Instruct in short duration ice massage.       Patient will benefit from skilled therapeutic intervention in order to improve the following deficits and impairments:  Abnormal gait, Decreased activity tolerance, Decreased strength, Decreased range of motion, Difficulty walking, Increased edema, Pain  Visit Diagnosis: Acute pain of left knee     Problem List Patient Active Problem List   Diagnosis Date Noted  . SVT (supraventricular tachycardia) ([place]) 03/19/2018  . Hypothyroidism 03/19/2018  . Hypokalemia 03/19/2018  . Hyperglycemia 03/19/2018  . Barretts esophagus 03/25/2016  . Dysphagia 04/07/2012  . Fibromyalgia 04/07/2012  . GERD (gastroesophageal reflux disease) 04/07/2012  . Hypothyroid 04/07/2012  . Shingles 04/07/2012    Deolinda Frid,CHRIS, PTA 05/18/2019, 3:13 PM  [facility] [place], [place], [address] Phone: [phone]   Fax:  [phone]  Name: Shelly [name] MRN: [id number] Date of Birth: July 04, 1961

## 2019-05-22 ENCOUNTER — Ambulatory Visit: Payer: Managed Care, Other (non HMO) | Admitting: *Deleted

## 2019-05-22 DIAGNOSIS — M7752 Other enthesopathy of left foot: Secondary | ICD-10-CM | POA: Insufficient documentation

## 2019-05-24 ENCOUNTER — Encounter: Payer: Self-pay | Admitting: Physical Therapy

## 2019-05-24 ENCOUNTER — Other Ambulatory Visit: Payer: Self-pay

## 2019-05-24 ENCOUNTER — Ambulatory Visit: Payer: Managed Care, Other (non HMO) | Admitting: Physical Therapy

## 2019-05-24 DIAGNOSIS — M25562 Pain in left knee: Secondary | ICD-10-CM | POA: Diagnosis not present

## 2019-05-24 NOTE — Therapy (Signed)
[facility] [place], [place], [address] Phone: [phone]   Fax:  [phone]  Physical Therapy Treatment  Patient Details  Name: Shelly [name] MRN: [id number] Date of Birth: 31-Jul-1961 Referring Provider (PT): Marquette Saa MD.   Encounter Date: 05/24/2019  PT End of Session - 05/24/19 1341    Visit Number  3    Number of Visits  12    Date for PT Re-Evaluation  06/27/19    Authorization Time Period  PROGRESS NOTE AT 10TH VISIT.  KX MODIFIER AFTER 15 VISITS.    PT Start Time  0100    PT Stop Time  0148    PT Time Calculation (min)  48 min    Equipment Utilized During Treatment  Left knee immobilizer    Activity Tolerance  Patient tolerated treatment well    Behavior During Therapy  WFL for tasks assessed/performed       Past Medical History:  Diagnosis Date  . Acid reflux   . Adverse effect of general anesthetic    shivering recently-had to have demerol  . Arthritis   . Carpal tunnel syndrome   . Focal nodular hyperplasia of liver surgery in 2004   partial hepactectomy  . GERD (gastroesophageal reflux disease)   . Hypothyroidism   . PONV (postoperative nausea and vomiting)   . SVT (supraventricular tachycardia) ([place]) 03/19/2018   documented with EKG in epic.  . Wears glasses     Past Surgical History:  Procedure Laterality Date  . CARPAL TUNNEL RELEASE     rt  . CARPAL TUNNEL RELEASE Left 01/12/2013   Procedure: CARPAL TUNNEL RELEASE;  Surgeon: Cammie Sickle., MD;  Location: [place];  Service: Orthopedics;  Laterality: Left;  . CARPOMETACARPEL SUSPENSION PLASTY Left 07/24/2014   Procedure: LEFT THUMB CARPOMETACARPEL ([place]) ARTHROPLASTY GARCIA ELIAS ;  Surgeon: Leanora Cover, MD;  Location: [place];  Service: Orthopedics;  Laterality: Left;  . CHOLECYSTECTOMY  2004   liver resection benign tumor with GB  . COLONOSCOPY  03/05/2011   Procedure: COLONOSCOPY;  Surgeon:  Rogene Houston, MD;  [facility];  Service: Endoscopy;  Laterality: N/A;  . DORSAL COMPARTMENT RELEASE Left 01/12/2013   Procedure: RELEASE DORSAL COMPARTMENT (DEQUERVAIN);  Surgeon: Cammie Sickle., MD;  Location: [facility];  Service: Orthopedics;  Laterality: Left;  . ESOPHAGOGASTRODUODENOSCOPY  03/05/2011   Procedure: ESOPHAGOGASTRODUODENOSCOPY (EGD);  Surgeon: Rogene Houston, MD;  [facility];  Service: Endoscopy;  Laterality: N/A;  . ESOPHAGOGASTRODUODENOSCOPY N/A 05/14/2016   Procedure: ESOPHAGOGASTRODUODENOSCOPY (EGD);  Surgeon: Rogene Houston, MD;  [facility];  Service: Endoscopy;  Laterality: N/A;  300  . FRACTURE SURGERY  2011   rt ankle  . PARTIAL HYSTERECTOMY    . STERIOD INJECTION Left 01/12/2013   Procedure: INJECTION LEFT THUMB [place] JOINT;  Surgeon: Cammie Sickle., MD;  Location: [place];  Service: Orthopedics;  Laterality: Left;  . SVT ABLATION N/A 05/03/2018   Procedure: SVT ABLATION;  Surgeon: Thompson Grayer, MD;  Location: [place] [facility];  Service: Cardiovascular;  Laterality: N/A;  . THYROIDECTOMY, PARTIAL    . TIBIA HARDWARE REMOVAL  2011   rt ankle   . TRIGGER FINGER RELEASE Left 01/12/2013   Procedure: RELEASE TRIGGER FINGER/A-1 PULLEY;  Surgeon: Cammie Sickle., MD;  Location: [place];  Service: Orthopedics;  Laterality: Left;    There were no  vitals filed for this visit.  Subjective Assessment - 05/24/19 1302    Subjective  COVID19 screening performed prior to entering the building. "Feeling better today"    Pertinent History  5 knee surgeries (bilateral). SVT, CTS, PVNS.    How long can you stand comfortably?  10 minutes.    How long can you walk comfortably?  Short distances around house.    Patient Stated Goals  Get out of pain.    Currently in Pain?  Yes    Pain Score  2     Pain Location  Calf    Pain Orientation  Left    Pain Descriptors / Indicators   Sore    Pain Type  Acute pain    Pain Onset  1 to 4 weeks ago    Pain Frequency  Intermittent    Aggravating Factors   at the end of the day after a day of being up    Pain Relieving Factors  at rest                       [facility] Adult PT Treatment/Exercise - 05/24/19 0001      Exercises   Exercises  Ankle      Moist Heat Therapy   Number Minutes Moist Heat  15 Minutes    Moist Heat Location  --   left calf     Electrical Stimulation   Electrical Stimulation Location  left calf and lateral ankle-distal achilles    Electrical Stimulation Action  premod    Electrical Stimulation Parameters  80-150hz  x33min    Electrical Stimulation Goals  Pain      Manual Therapy   Manual Therapy  Soft tissue mobilization    Manual therapy comments  very gentle STW to lateral ankle area of sharp pain followed by desensitize technique with cold gel and cloth    Soft tissue mobilization  Gentle STW to LT upper calf and into mid calf as tolerated.  dry cloth used to help desensitize mid-calf into achilles      Ankle Exercises: Seated   Other Seated Ankle Exercises  seated rockerboad x70min DF/PF                  PT Long Term Goals - 05/24/19 1346      PT LONG TERM GOAL #1   Title  Independent with a HEP.    Time  6    Period  Weeks    Status  On-going      PT LONG TERM GOAL #2   Title  Increase left ankle dorsiflexion to 6- 8 degrees to normalize the patient's gait pattern.    Time  6    Period  Weeks    Status  On-going      PT LONG TERM GOAL #3   Title  Walk with normal gait pattern.    Time  6    Period  Weeks    Status  On-going      PT LONG TERM GOAL #4   Title  Perform ADL's with pain not > 3/10.    Time  6    Period  Weeks    Status  On-going            Plan - 05/24/19 1346    Clinical Impression Statement  Patient tolerated treatment well today. Patient arrived with less pain and able to tolerate more hands on manual STW today. Patient has  tightness  in muscles, decreased motion for DF and sharp pain lateral ankle. Patient current goals ongoing.    Personal Factors and Comorbidities  Comorbidity 1;Comorbidity 2;Other    Comorbidities  5 knee surgeries (bilateral). SVT, CTS, PVNS.    Examination-Activity Limitations  Locomotion Level;Stand;Squat    Examination-Participation Restrictions  Other    Stability/Clinical Decision Making  Evolving/Moderate complexity    Rehab Potential  Good    PT Frequency  2x / week    PT Treatment/Interventions  ADLs/Self Care Home Management;Cryotherapy;Electrical Stimulation;Gait training;Ultrasound;Moist Heat;Stair training;Functional mobility training;Iontophoresis 4mg /ml Dexamethasone;Therapeutic activities;Therapeutic exercise;Manual techniques;Patient/family education;Passive range of motion;Vasopneumatic Device    PT Next Visit Plan  cont with gentle.  HMP/e'stim, U/S, gentle STW/M, ankel pumps, seated Rockerboard.    Consulted and Agree with Plan of Care  Patient       Patient will benefit from skilled therapeutic intervention in order to improve the following deficits and impairments:  Abnormal gait, Decreased activity tolerance, Decreased strength, Decreased range of motion, Difficulty walking, Increased edema, Pain  Visit Diagnosis: Acute pain of left knee     Problem List Patient Active Problem List   Diagnosis Date Noted  . SVT (supraventricular tachycardia) ([place]) 03/19/2018  . Hypothyroidism 03/19/2018  . Hypokalemia 03/19/2018  . Hyperglycemia 03/19/2018  . Barretts esophagus 03/25/2016  . Dysphagia 04/07/2012  . Fibromyalgia 04/07/2012  . GERD (gastroesophageal reflux disease) 04/07/2012  . Hypothyroid 04/07/2012  . Shingles 04/07/2012    Tifini Reeder P, PTA 05/24/2019, 1:55 PM  [facility] [place], [place], [address] Phone: [phone]   Fax:  [phone]  Name: JAMMI DACRUZ MRN: [id number] Date  of Birth: 1961-01-01

## 2019-05-25 ENCOUNTER — Other Ambulatory Visit: Payer: Self-pay

## 2019-05-25 ENCOUNTER — Ambulatory Visit: Payer: Managed Care, Other (non HMO) | Admitting: *Deleted

## 2019-05-25 DIAGNOSIS — M25562 Pain in left knee: Secondary | ICD-10-CM

## 2019-05-25 NOTE — Therapy (Signed)
[facility] [place], [place], [address] Phone: [phone]   Fax:  [phone]  Physical Therapy Treatment  Patient Details  Name: Shelly [name] MRN: [id number] Date of Birth: [birth year] Referring Provider (PT): Marquette Saa MD.   Encounter Date: 05/25/2019  PT End of Session - 05/25/19 1450    Visit Number  4    Number of Visits  12    Date for PT Re-Evaluation  06/27/19    Authorization Time Period  PROGRESS NOTE AT 10TH VISIT.  KX MODIFIER AFTER 15 VISITS.    PT Start Time  1345    PT Stop Time  1435    PT Time Calculation (min)  50 min       Past Medical History:  Diagnosis Date  . Acid reflux   . Adverse effect of general anesthetic    shivering recently-had to have demerol  . Arthritis   . Carpal tunnel syndrome   . Focal nodular hyperplasia of liver surgery in 2004   partial hepactectomy  . GERD (gastroesophageal reflux disease)   . Hypothyroidism   . PONV (postoperative nausea and vomiting)   . SVT (supraventricular tachycardia) ([place]) 03/19/2018   documented with EKG in epic.  . Wears glasses     Past Surgical History:  Procedure Laterality Date  . CARPAL TUNNEL RELEASE     rt  . CARPAL TUNNEL RELEASE Left 01/12/2013   Procedure: CARPAL TUNNEL RELEASE;  Surgeon: Cammie Sickle., MD;  Location: [place];  Service: Orthopedics;  Laterality: Left;  . CARPOMETACARPEL SUSPENSION PLASTY Left 07/24/2014   Procedure: LEFT THUMB CARPOMETACARPEL ([place]) ARTHROPLASTY GARCIA ELIAS ;  Surgeon: Leanora Cover, MD;  Location: [place];  Service: Orthopedics;  Laterality: Left;  . CHOLECYSTECTOMY  2004   liver resection benign tumor with GB  . COLONOSCOPY  03/05/2011   Procedure: COLONOSCOPY;  Surgeon: Rogene Houston, MD;  [facility];  Service: Endoscopy;  Laterality: N/A;  . DORSAL COMPARTMENT RELEASE Left 01/12/2013   Procedure: RELEASE DORSAL COMPARTMENT (DEQUERVAIN);   Surgeon: Cammie Sickle., MD;  Location: [facility];  Service: Orthopedics;  Laterality: Left;  . ESOPHAGOGASTRODUODENOSCOPY  03/05/2011   Procedure: ESOPHAGOGASTRODUODENOSCOPY (EGD);  Surgeon: Rogene Houston, MD;  [facility];  Service: Endoscopy;  Laterality: N/A;  . ESOPHAGOGASTRODUODENOSCOPY N/A 05/14/2016   Procedure: ESOPHAGOGASTRODUODENOSCOPY (EGD);  Surgeon: Rogene Houston, MD;  [facility];  Service: Endoscopy;  Laterality: N/A;  300  . FRACTURE SURGERY  2011   rt ankle  . PARTIAL HYSTERECTOMY    . STERIOD INJECTION Left 01/12/2013   Procedure: INJECTION LEFT THUMB [place] JOINT;  Surgeon: Cammie Sickle., MD;  Location: [place];  Service: Orthopedics;  Laterality: Left;  . SVT ABLATION N/A 05/03/2018   Procedure: SVT ABLATION;  Surgeon: Thompson Grayer, MD;  Location: [place] [facility];  Service: Cardiovascular;  Laterality: N/A;  . THYROIDECTOMY, PARTIAL    . TIBIA HARDWARE REMOVAL  2011   rt ankle   . TRIGGER FINGER RELEASE Left 01/12/2013   Procedure: RELEASE TRIGGER FINGER/A-1 PULLEY;  Surgeon: Cammie Sickle., MD;  Location: [place];  Service: Orthopedics;  Laterality: Left;    There were no vitals filed for this visit.  Subjective Assessment - 05/25/19 1348    Subjective  COVID19 screening performed prior to entering the building. "Feeling better today" CAM boot 2-4 weeks MD  F/U 11-2--20    Pertinent History  5 knee surgeries (bilateral). SVT, CTS, PVNS.    How long can you stand comfortably?  10 minutes.    How long can you walk comfortably?  Short distances around house.    Patient Stated Goals  Get out of pain.    Currently in Pain?  Yes    Pain Score  2     Pain Location  Calf    Pain Orientation  Left    Pain Descriptors / Indicators  Sore;Discomfort;Shooting    Pain Type  Acute pain    Pain Onset  1 to 4 weeks ago                       [facility] Adult PT  Treatment/Exercise - 05/25/19 0001      Exercises   Exercises  Ankle      Modalities   Modalities  Electrical Stimulation;Moist Heat;Ultrasound      Moist Heat Therapy   Number Minutes Moist Heat  15 Minutes    Moist Heat Location  --   left calf     Electrical Stimulation   Electrical Stimulation Location  left calf and lateral ankle-distal achilles    Electrical Stimulation Action  IFC 80-150hz  x 15 min    Electrical Stimulation Goals  Pain      Ultrasound   Ultrasound Location  LT calf lateral aspectt    Ultrasound Parameters  1 w/cm2 x 10 mins    Ultrasound Goals  Pain      Manual Therapy   Manual Therapy  Soft tissue mobilization    Manual therapy comments  --    Soft tissue mobilization  Gentle STW to LT upper calf and into mid calf as tolerated.  dry cloth used to help desensitize mid-calf into achilles      Ankle Exercises: Seated   Other Seated Ankle Exercises  seated rockerboad x12min DF/PF                  PT Long Term Goals - 05/24/19 1346      PT LONG TERM GOAL #1   Title  Independent with a HEP.    Time  6    Period  Weeks    Status  On-going      PT LONG TERM GOAL #2   Title  Increase left ankle dorsiflexion to 6- 8 degrees to normalize the patient's gait pattern.    Time  6    Period  Weeks    Status  On-going      PT LONG TERM GOAL #3   Title  Walk with normal gait pattern.    Time  6    Period  Weeks    Status  On-going      PT LONG TERM GOAL #4   Title  Perform ADL's with pain not > 3/10.    Time  6    Period  Weeks    Status  On-going            Plan - 05/25/19 1456    Clinical Impression Statement  Pt arrived today ambulating with CAM boot on LT and reports that she is doing better with less pain and decreased sensitivity. She did well with Rx and was able to tolerate [place] today as well as light to medium STW pressure. Normal modality response today    Comorbidities  5 knee surgeries (bilateral). SVT, CTS, PVNS.  Examination-Activity Limitations  Locomotion Level;Stand;Squat    Examination-Participation Restrictions  Other    Rehab Potential  Good    PT Frequency  2x / week    PT Duration  6 weeks    PT Treatment/Interventions  ADLs/Self Care Home Management;Cryotherapy;Electrical Stimulation;Gait training;Ultrasound;Moist Heat;Stair training;Functional mobility training;Iontophoresis 4mg /ml Dexamethasone;Therapeutic activities;Therapeutic exercise;Manual techniques;Patient/family education;Passive range of motion;Vasopneumatic Device    PT Next Visit Plan  cont with gentle.  HMP/e'stim, U/S, gentle STW/M, ankel pumps, seated Rockerboard.       Patient will benefit from skilled therapeutic intervention in order to improve the following deficits and impairments:  Abnormal gait, Decreased activity tolerance, Decreased strength, Decreased range of motion, Difficulty walking, Increased edema, Pain  Visit Diagnosis: Acute pain of left knee     Problem List Patient Active Problem List   Diagnosis Date Noted  . SVT (supraventricular tachycardia) ([place]) 03/19/2018  . Hypothyroidism 03/19/2018  . Hypokalemia 03/19/2018  . Hyperglycemia 03/19/2018  . Barretts esophagus 03/25/2016  . Dysphagia 04/07/2012  . Fibromyalgia 04/07/2012  . GERD (gastroesophageal reflux disease) 04/07/2012  . Hypothyroid 04/07/2012  . Shingles 04/07/2012    Jerie Basford,CHRIS, PTA 05/25/2019, 3:06 PM  [facility] [place], [place], [address] Phone: [phone]   Fax:  [phone]  Name: Shelly [name] MRN: [id number] Date of Birth: [birth year]

## 2019-05-30 ENCOUNTER — Other Ambulatory Visit: Payer: Self-pay

## 2019-05-30 ENCOUNTER — Ambulatory Visit: Payer: Managed Care, Other (non HMO) | Admitting: *Deleted

## 2019-05-30 ENCOUNTER — Encounter: Payer: Self-pay | Admitting: *Deleted

## 2019-05-30 DIAGNOSIS — M25562 Pain in left knee: Secondary | ICD-10-CM | POA: Diagnosis not present

## 2019-05-30 NOTE — Therapy (Signed)
[facility] [place], [place], [address] Phone: [phone]   Fax:  [phone]  Physical Therapy Treatment  Patient Details  Name: Shelly [name] MRN: [id number] Date of Birth: 10/17/1960 Referring Provider (PT): Marquette Saa MD.   Encounter Date: 05/30/2019  PT End of Session - 05/30/19 1353    Visit Number  5    Number of Visits  12    Date for PT Re-Evaluation  06/27/19    Authorization Time Period  PROGRESS NOTE AT 10TH VISIT.  KX MODIFIER AFTER 15 VISITS.    PT Start Time  1345    PT Stop Time  1435    PT Time Calculation (min)  50 min       Past Medical History:  Diagnosis Date  . Acid reflux   . Adverse effect of general anesthetic    shivering recently-had to have demerol  . Arthritis   . Carpal tunnel syndrome   . Focal nodular hyperplasia of liver surgery in 2004   partial hepactectomy  . GERD (gastroesophageal reflux disease)   . Hypothyroidism   . PONV (postoperative nausea and vomiting)   . SVT (supraventricular tachycardia) ([place]) 03/19/2018   documented with EKG in epic.  . Wears glasses     Past Surgical History:  Procedure Laterality Date  . CARPAL TUNNEL RELEASE     rt  . CARPAL TUNNEL RELEASE Left 01/12/2013   Procedure: CARPAL TUNNEL RELEASE;  Surgeon: Cammie Sickle., MD;  Location: [place];  Service: Orthopedics;  Laterality: Left;  . CARPOMETACARPEL SUSPENSION PLASTY Left 07/24/2014   Procedure: LEFT THUMB CARPOMETACARPEL ([place]) ARTHROPLASTY GARCIA ELIAS ;  Surgeon: Leanora Cover, MD;  Location: [place];  Service: Orthopedics;  Laterality: Left;  . CHOLECYSTECTOMY  2004   liver resection benign tumor with GB  . COLONOSCOPY  03/05/2011   Procedure: COLONOSCOPY;  Surgeon: Rogene Houston, MD;  [facility];  Service: Endoscopy;  Laterality: N/A;  . DORSAL COMPARTMENT RELEASE Left 01/12/2013   Procedure: RELEASE DORSAL COMPARTMENT  (DEQUERVAIN);  Surgeon: Cammie Sickle., MD;  Location: [facility];  Service: Orthopedics;  Laterality: Left;  . ESOPHAGOGASTRODUODENOSCOPY  03/05/2011   Procedure: ESOPHAGOGASTRODUODENOSCOPY (EGD);  Surgeon: Rogene Houston, MD;  [facility];  Service: Endoscopy;  Laterality: N/A;  . ESOPHAGOGASTRODUODENOSCOPY N/A 05/14/2016   Procedure: ESOPHAGOGASTRODUODENOSCOPY (EGD);  Surgeon: Rogene Houston, MD;  [facility];  Service: Endoscopy;  Laterality: N/A;  300  . FRACTURE SURGERY  2011   rt ankle  . PARTIAL HYSTERECTOMY    . STERIOD INJECTION Left 01/12/2013   Procedure: INJECTION LEFT THUMB [place] JOINT;  Surgeon: Cammie Sickle., MD;  Location: [place];  Service: Orthopedics;  Laterality: Left;  . SVT ABLATION N/A 05/03/2018   Procedure: SVT ABLATION;  Surgeon: Thompson Grayer, MD;  Location: [place] [facility];  Service: Cardiovascular;  Laterality: N/A;  . THYROIDECTOMY, PARTIAL    . TIBIA HARDWARE REMOVAL  2011   rt ankle   . TRIGGER FINGER RELEASE Left 01/12/2013   Procedure: RELEASE TRIGGER FINGER/A-1 PULLEY;  Surgeon: Cammie Sickle., MD;  Location: [place];  Service: Orthopedics;  Laterality: Left;    There were no vitals filed for this visit.  Subjective Assessment - 05/30/19 1351    Subjective  COVID19 screening performed prior to entering the building. "Feeling better today" CAM boot 2-4 weeks MD  F/U 11-2--20. Doing better 2/10 today    Pertinent History  5 knee surgeries (bilateral). SVT, CTS, PVNS.    How long can you stand comfortably?  10 minutes.    How long can you walk comfortably?  Short distances around house.    Patient Stated Goals  Get out of pain.    Currently in Pain?  Yes    Pain Score  2                        [facility] Adult PT Treatment/Exercise - 05/30/19 0001      Exercises   Exercises  Ankle      Modalities   Modalities  Electrical Stimulation;Moist  Heat;Ultrasound      Moist Heat Therapy   Number Minutes Moist Heat  15 Minutes    Moist Heat Location  --   left calf     Electrical Stimulation   Electrical Stimulation Location  left calf and lateral ankle-distal achilles    Electrical Stimulation Action  IFC x 15 mins 80-150hz     Electrical Stimulation Goals  Pain      Ultrasound   Ultrasound Location  LT calf/lateral aspect    Ultrasound Parameters  1 w/cm2 x 10 mins    Ultrasound Goals  Pain      Manual Therapy   Manual Therapy  Soft tissue mobilization    Soft tissue mobilization  Gentle STW to LT upper calf and into mid calf as tolerated.  dry cloth used to help desensitize mid-calf into achilles      Ankle Exercises: Seated   Other Seated Ankle Exercises  seated rockerboad x50min DF/PF                  PT Long Term Goals - 05/24/19 1346      PT LONG TERM GOAL #1   Title  Independent with a HEP.    Time  6    Period  Weeks    Status  On-going      PT LONG TERM GOAL #2   Title  Increase left ankle dorsiflexion to 6- 8 degrees to normalize the patient's gait pattern.    Time  6    Period  Weeks    Status  On-going      PT LONG TERM GOAL #3   Title  Walk with normal gait pattern.    Time  6    Period  Weeks    Status  On-going      PT LONG TERM GOAL #4   Title  Perform ADL's with pain not > 3/10.    Time  6    Period  Weeks    Status  On-going            Plan - 05/30/19 1438    Clinical Impression Statement  Pt arrived today doing better still with decreased pain and hypersensitivity in LT calf/achilles. She was able to perform AROM on rockerboard today tolerate [place] again as well. Stil with notable hypersensitivity loer calf area during STW. Normal modality response today    Personal Factors and Comorbidities  Comorbidity 1;Comorbidity 2;Other    Comorbidities  5 knee surgeries (bilateral). SVT, CTS, PVNS.    Examination-Activity Limitations  Locomotion Level;Stand;Squat     Examination-Participation Restrictions  Other    Stability/Clinical Decision Making  Evolving/Moderate complexity    Rehab Potential  Good    PT Frequency  2x / week  PT Duration  6 weeks    PT Treatment/Interventions  ADLs/Self Care Home Management;Cryotherapy;Electrical Stimulation;Gait training;Ultrasound;Moist Heat;Stair training;Functional mobility training;Iontophoresis 4mg /ml Dexamethasone;Therapeutic activities;Therapeutic exercise;Manual techniques;Patient/family education;Passive range of motion;Vasopneumatic Device    PT Next Visit Plan  cont with gentle.  HMP/e'stim, U/S, gentle STW/M, ankel pumps, seated Rockerboard.    Consulted and Agree with Plan of Care  Patient       Patient will benefit from skilled therapeutic intervention in order to improve the following deficits and impairments:  Abnormal gait, Decreased activity tolerance, Decreased strength, Decreased range of motion, Difficulty walking, Increased edema, Pain  Visit Diagnosis: Acute pain of left knee     Problem List Patient Active Problem List   Diagnosis Date Noted  . SVT (supraventricular tachycardia) ([place]) 03/19/2018  . Hypothyroidism 03/19/2018  . Hypokalemia 03/19/2018  . Hyperglycemia 03/19/2018  . Barretts esophagus 03/25/2016  . Dysphagia 04/07/2012  . Fibromyalgia 04/07/2012  . GERD (gastroesophageal reflux disease) 04/07/2012  . Hypothyroid 04/07/2012  . Shingles 04/07/2012    Tangi Shroff,CHRIS, PTA 05/30/2019, 2:51 PM  [facility] [address] [place], [place], [address] Phone: [phone]   Fax:  [phone]  Name: Shelly [name] MRN: [id number] Date of Birth: 21-Aug-1960

## 2019-06-01 ENCOUNTER — Ambulatory Visit: Payer: Managed Care, Other (non HMO) | Admitting: *Deleted

## 2019-06-01 ENCOUNTER — Other Ambulatory Visit: Payer: Self-pay

## 2019-06-01 DIAGNOSIS — M25562 Pain in left knee: Secondary | ICD-10-CM

## 2019-06-01 NOTE — Therapy (Signed)
[facility] [place], [place], [address] Phone: [phone]   Fax:  [phone]  Physical Therapy Treatment  Patient Details  Name: Shelly [name] MRN: [id number] Date of Birth: [birth year] Referring Provider (PT): Marquette Saa MD.   Encounter Date: 06/01/2019  PT End of Session - 06/01/19 1351    Visit Number  6    Number of Visits  12    Date for PT Re-Evaluation  06/27/19    PT Start Time  [id number]    PT Stop Time  1436    PT Time Calculation (min)  51 min       Past Medical History:  Diagnosis Date  . Acid reflux   . Adverse effect of general anesthetic    shivering recently-had to have demerol  . Arthritis   . Carpal tunnel syndrome   . Focal nodular hyperplasia of liver surgery in 2004   partial hepactectomy  . GERD (gastroesophageal reflux disease)   . Hypothyroidism   . PONV (postoperative nausea and vomiting)   . SVT (supraventricular tachycardia) ([place]) 03/19/2018   documented with EKG in epic.  . Wears glasses     Past Surgical History:  Procedure Laterality Date  . CARPAL TUNNEL RELEASE     rt  . CARPAL TUNNEL RELEASE Left 01/12/2013   Procedure: CARPAL TUNNEL RELEASE;  Surgeon: Cammie Sickle., MD;  Location: [place];  Service: Orthopedics;  Laterality: Left;  . CARPOMETACARPEL SUSPENSION PLASTY Left 07/24/2014   Procedure: LEFT THUMB CARPOMETACARPEL ([place]) ARTHROPLASTY GARCIA ELIAS ;  Surgeon: Leanora Cover, MD;  Location: [place];  Service: Orthopedics;  Laterality: Left;  . CHOLECYSTECTOMY  2004   liver resection benign tumor with GB  . COLONOSCOPY  03/05/2011   Procedure: COLONOSCOPY;  Surgeon: Rogene Houston, MD;  [facility];  Service: Endoscopy;  Laterality: N/A;  . DORSAL COMPARTMENT RELEASE Left 01/12/2013   Procedure: RELEASE DORSAL COMPARTMENT (DEQUERVAIN);  Surgeon: Cammie Sickle., MD;  Location: [facility];  Service:  Orthopedics;  Laterality: Left;  . ESOPHAGOGASTRODUODENOSCOPY  03/05/2011   Procedure: ESOPHAGOGASTRODUODENOSCOPY (EGD);  Surgeon: Rogene Houston, MD;  [facility];  Service: Endoscopy;  Laterality: N/A;  . ESOPHAGOGASTRODUODENOSCOPY N/A 05/14/2016   Procedure: ESOPHAGOGASTRODUODENOSCOPY (EGD);  Surgeon: Rogene Houston, MD;  [facility];  Service: Endoscopy;  Laterality: N/A;  300  . FRACTURE SURGERY  2011   rt ankle  . PARTIAL HYSTERECTOMY    . STERIOD INJECTION Left 01/12/2013   Procedure: INJECTION LEFT THUMB [place] JOINT;  Surgeon: Cammie Sickle., MD;  Location: [place];  Service: Orthopedics;  Laterality: Left;  . SVT ABLATION N/A 05/03/2018   Procedure: SVT ABLATION;  Surgeon: Thompson Grayer, MD;  Location: [place] [facility];  Service: Cardiovascular;  Laterality: N/A;  . THYROIDECTOMY, PARTIAL    . TIBIA HARDWARE REMOVAL  2011   rt ankle   . TRIGGER FINGER RELEASE Left 01/12/2013   Procedure: RELEASE TRIGGER FINGER/A-1 PULLEY;  Surgeon: Cammie Sickle., MD;  Location: [place];  Service: Orthopedics;  Laterality: Left;    There were no vitals filed for this visit.  Subjective Assessment - 06/01/19 1345    Subjective  COVID19 screening performed prior to entering the building. "Feeling better today" CAM boot 2-4 weeks MD F/U 11-2--20. No CAM boot today due to making my knee hurt.    Pertinent History  5 knee surgeries (bilateral). SVT, CTS, PVNS.    How long can you stand comfortably?  10 minutes.    How long can you walk comfortably?  Short distances around house.    Patient Stated Goals  Get out of pain.    Currently in Pain?  Yes    Pain Score  3     Pain Location  Calf    Pain Orientation  Left    Pain Descriptors / Indicators  Sore    Pain Type  Acute pain    Pain Onset  1 to 4 weeks ago                       [facility] Adult PT Treatment/Exercise - 06/01/19 0001      Exercises   Exercises   Ankle      Modalities   Modalities  Electrical Stimulation;Moist Heat;Ultrasound      Moist Heat Therapy   Number Minutes Moist Heat  15 Minutes    Moist Heat Location  --   left calf     Electrical Stimulation   Electrical Stimulation Location  left calf and lateral ankle-distal achilles    Electrical Stimulation Action  IFC x 15 mins 80-150hz       Ultrasound   Ultrasound Location  LT calf/ lateral aspect    Ultrasound Parameters  prone 1.5 w/cm2 x 10 mins    Ultrasound Goals  Pain      Manual Therapy   Manual Therapy  Soft tissue mobilization    Manual therapy comments  Prone    Soft tissue mobilization  Gentle STW/ and IASTM to LT upper calf and into mid calf as tolerated.  dry cloth used to help desensitize mid-calf into achilles      Ankle Exercises: Seated   Other Seated Ankle Exercises  seated rockerboad x75min DF/PF, dyna disc x 2 mins                  PT Long Term Goals - 06/01/19 1451      PT LONG TERM GOAL #5   Title  Patient able to amb without AD safely 350 feet.    Time  6    Period  Weeks    Status  Achieved            Plan - 06/01/19 1452    Clinical Impression Statement  Pt arrived today without CAM boot due to it was causing LT knee pain. She was able to perform seated rocker board ok, but had some increased pain with dyna disc. IASTM was performed today, but very lightly due to sensitivity. She did very well with regular STW. DF ROM was 8 degrees today with knee flexed    Personal Factors and Comorbidities  Comorbidity 1;Comorbidity 2;Other    Examination-Activity Limitations  Locomotion Level;Stand;Squat    Stability/Clinical Decision Making  Evolving/Moderate complexity    Rehab Potential  Good    PT Frequency  2x / week    PT Duration  6 weeks    PT Treatment/Interventions  ADLs/Self Care Home Management;Cryotherapy;Electrical Stimulation;Gait training;Ultrasound;Moist Heat;Stair training;Functional mobility training;Iontophoresis  4mg /ml Dexamethasone;Therapeutic activities;Therapeutic exercise;Manual techniques;Patient/family education;Passive range of motion;Vasopneumatic Device    PT Next Visit Plan  cont with gentle.  HMP/e'stim, U/S, gentle STW/M, ankel pumps, seated Rockerboard.    Consulted and Agree with Plan of Care  Patient       Patient will benefit from skilled therapeutic intervention in order to improve the  following deficits and impairments:  Abnormal gait, Decreased activity tolerance, Decreased strength, Decreased range of motion, Difficulty walking, Increased edema, Pain  Visit Diagnosis: Acute pain of left knee     Problem List Patient Active Problem List   Diagnosis Date Noted  . SVT (supraventricular tachycardia) ([place]) 03/19/2018  . Hypothyroidism 03/19/2018  . Hypokalemia 03/19/2018  . Hyperglycemia 03/19/2018  . Barretts esophagus 03/25/2016  . Dysphagia 04/07/2012  . Fibromyalgia 04/07/2012  . GERD (gastroesophageal reflux disease) 04/07/2012  . Hypothyroid 04/07/2012  . Shingles 04/07/2012    Kery Haltiwanger,CHRIS, PTA 06/01/2019, 3:08 PM  [facility] [place], [place], [address] Phone: [phone]   Fax:  [phone]  Name: Shelly [name] MRN: [id number] Date of Birth: 1961/02/10

## 2019-06-06 ENCOUNTER — Ambulatory Visit: Payer: Managed Care, Other (non HMO) | Admitting: Physical Therapy

## 2019-06-08 ENCOUNTER — Encounter: Payer: Self-pay | Admitting: *Deleted

## 2019-06-08 ENCOUNTER — Other Ambulatory Visit: Payer: Self-pay

## 2019-06-08 ENCOUNTER — Ambulatory Visit: Payer: Managed Care, Other (non HMO) | Admitting: *Deleted

## 2019-06-08 DIAGNOSIS — M25562 Pain in left knee: Secondary | ICD-10-CM | POA: Diagnosis not present

## 2019-06-08 NOTE — Therapy (Signed)
[facility] [place], [place], [address] Phone: [phone]   Fax:  [phone]  Physical Therapy Treatment  Patient Details  Name: Shelly [name] MRN: [id number] Date of Birth: 04-08-1961 Referring Provider (PT): Marquette Saa MD.   Encounter Date: 06/08/2019  PT End of Session - 06/08/19 1522    Visit Number  7    Number of Visits  12    Date for PT Re-Evaluation  06/27/19    Authorization Time Period  PROGRESS NOTE AT 10TH VISIT.  KX MODIFIER AFTER 15 VISITS.    PT Start Time  1515    PT Stop Time  1605    PT Time Calculation (min)  50 min       Past Medical History:  Diagnosis Date  . Acid reflux   . Adverse effect of general anesthetic    shivering recently-had to have demerol  . Arthritis   . Carpal tunnel syndrome   . Focal nodular hyperplasia of liver surgery in 2004   partial hepactectomy  . GERD (gastroesophageal reflux disease)   . Hypothyroidism   . PONV (postoperative nausea and vomiting)   . SVT (supraventricular tachycardia) ([place]) 03/19/2018   documented with EKG in epic.  . Wears glasses     Past Surgical History:  Procedure Laterality Date  . CARPAL TUNNEL RELEASE     rt  . CARPAL TUNNEL RELEASE Left 01/12/2013   Procedure: CARPAL TUNNEL RELEASE;  Surgeon: Cammie Sickle., MD;  Location: [place];  Service: Orthopedics;  Laterality: Left;  . CARPOMETACARPEL SUSPENSION PLASTY Left 07/24/2014   Procedure: LEFT THUMB CARPOMETACARPEL ([place]) ARTHROPLASTY GARCIA ELIAS ;  Surgeon: Leanora Cover, MD;  Location: [place];  Service: Orthopedics;  Laterality: Left;  . CHOLECYSTECTOMY  2004   liver resection benign tumor with GB  . COLONOSCOPY  03/05/2011   Procedure: COLONOSCOPY;  Surgeon: Rogene Houston, MD;  [facility];  Service: Endoscopy;  Laterality: N/A;  . DORSAL COMPARTMENT RELEASE Left 01/12/2013   Procedure: RELEASE DORSAL COMPARTMENT  (DEQUERVAIN);  Surgeon: Cammie Sickle., MD;  Location: [facility];  Service: Orthopedics;  Laterality: Left;  . ESOPHAGOGASTRODUODENOSCOPY  03/05/2011   Procedure: ESOPHAGOGASTRODUODENOSCOPY (EGD);  Surgeon: Rogene Houston, MD;  [facility];  Service: Endoscopy;  Laterality: N/A;  . ESOPHAGOGASTRODUODENOSCOPY N/A 05/14/2016   Procedure: ESOPHAGOGASTRODUODENOSCOPY (EGD);  Surgeon: Rogene Houston, MD;  [facility];  Service: Endoscopy;  Laterality: N/A;  300  . FRACTURE SURGERY  2011   rt ankle  . PARTIAL HYSTERECTOMY    . STERIOD INJECTION Left 01/12/2013   Procedure: INJECTION LEFT THUMB [place] JOINT;  Surgeon: Cammie Sickle., MD;  Location: [place];  Service: Orthopedics;  Laterality: Left;  . SVT ABLATION N/A 05/03/2018   Procedure: SVT ABLATION;  Surgeon: Thompson Grayer, MD;  Location: [place] [facility];  Service: Cardiovascular;  Laterality: N/A;  . THYROIDECTOMY, PARTIAL    . TIBIA HARDWARE REMOVAL  2011   rt ankle   . TRIGGER FINGER RELEASE Left 01/12/2013   Procedure: RELEASE TRIGGER FINGER/A-1 PULLEY;  Surgeon: Cammie Sickle., MD;  Location: [place];  Service: Orthopedics;  Laterality: Left;    There were no vitals filed for this visit.  Subjective Assessment - 06/08/19 1434    Subjective  COVID19 screening performed prior to entering the building. "Feeling better today" CAM boot 2-4 weeks MD  F/U 11-2--20. Have been doing a lot this week    Pertinent History  5 knee surgeries (bilateral). SVT, CTS, PVNS.    How long can you stand comfortably?  10 minutes.                       [facility] Adult PT Treatment/Exercise - 06/08/19 0001      Exercises   Exercises  Ankle      Modalities   Modalities  Electrical Stimulation;Moist Heat;Ultrasound      Moist Heat Therapy   Number Minutes Moist Heat  15 Minutes    Moist Heat Location  --   left calf     Electrical Stimulation    Electrical Stimulation Location  left calf and lateral ankle-distal achilles    Electrical Stimulation Action  IFC x 15 mins 80-150hz     Electrical Stimulation Goals  Pain      Ultrasound   Ultrasound Location  LT lateral calf    Ultrasound Parameters  prone 1.5 w/cm2    Ultrasound Goals  Pain      Manual Therapy   Manual Therapy  Soft tissue mobilization    Manual therapy comments  Prone    Soft tissue mobilization  Gentle STW/ and IASTM to LT upper calf and into mid calf as tolerated.  dry cloth used to help desensitize mid-calf into achilles      Ankle Exercises: Seated   Other Seated Ankle Exercises  seated rockerboad x37min DF/PF, dyna disc x 2 mins                  PT Long Term Goals - 06/01/19 1451      PT LONG TERM GOAL #5   Title  Patient able to amb without AD safely 350 feet.    Time  6    Period  Weeks    Status  Achieved            Plan - 06/08/19 1723    Clinical Impression Statement  Pt arrived today still without CAM boot and doing fairly well. She was able to continue with low level AROM exs on Rockerboard/ Dynadisc and did well. She is still sensitive to IASTM, but did well with STW. Normal modality response today.    Comorbidities  5 knee surgeries (bilateral). SVT, CTS, PVNS.    Examination-Activity Limitations  Locomotion Level;Stand;Squat    Stability/Clinical Decision Making  Evolving/Moderate complexity    Rehab Potential  Good    PT Frequency  2x / week    PT Duration  6 weeks    PT Treatment/Interventions  ADLs/Self Care Home Management;Cryotherapy;Electrical Stimulation;Gait training;Ultrasound;Moist Heat;Stair training;Functional mobility training;Iontophoresis 4mg /ml Dexamethasone;Therapeutic activities;Therapeutic exercise;Manual techniques;Patient/family education;Passive range of motion;Vasopneumatic Device    PT Next Visit Plan  cont with gentle.  HMP/e'stim, U/S, gentle STW/M, ankel pumps, seated Rockerboard.    Consulted and  Agree with Plan of Care  Patient       Patient will benefit from skilled therapeutic intervention in order to improve the following deficits and impairments:  Abnormal gait, Decreased activity tolerance, Decreased strength, Decreased range of motion, Difficulty walking, Increased edema, Pain  Visit Diagnosis: Acute pain of left knee     Problem List Patient Active Problem List   Diagnosis Date Noted  . SVT (supraventricular tachycardia) ([place]) 03/19/2018  . Hypothyroidism 03/19/2018  . Hypokalemia 03/19/2018  . Hyperglycemia 03/19/2018  . Barretts esophagus 03/25/2016  . Dysphagia 04/07/2012  . Fibromyalgia 04/07/2012  .  GERD (gastroesophageal reflux disease) 04/07/2012  . Hypothyroid 04/07/2012  . Shingles 04/07/2012    RAMSEUR,CHRIS, PTA 06/08/2019, 5:36 PM  [facility] [place], [place], [address] Phone: [phone]   Fax:  [phone]  Name: Shelly [name] MRN: [id number] Date of Birth: [birth year]

## 2019-06-14 ENCOUNTER — Other Ambulatory Visit: Payer: Self-pay

## 2019-06-14 ENCOUNTER — Telehealth (INDEPENDENT_AMBULATORY_CARE_PROVIDER_SITE_OTHER): Payer: Managed Care, Other (non HMO) | Admitting: Internal Medicine

## 2019-06-14 DIAGNOSIS — I471 Supraventricular tachycardia: Secondary | ICD-10-CM

## 2019-06-14 NOTE — Progress Notes (Signed)
Electrophysiology TeleHealth Note  Due to national recommendations of social distancing due to [place] 19, an audio telehealth visit is felt to be most appropriate for this patient at this time.  Verbal consent was obtained by me for the telehealth visit today.  The patient does not have capability for a virtual visit.  A phone visit is therefore required today.   Date:  06/14/2019   ID:  Shelly, [name] 02-12-1961, MRN [id number]  Location: patient's home  Provider location:  [facility]  Evaluation Performed: Follow-up visit  PCP:  Corrington, Kip A, MD   Electrophysiologist:  Dr Rayann Heman  Chief Complaint:  palpitations  History of Present Illness:    Shelly [name] is a 58 y.o. female who presents via telehealth conferencing today.  Since last being seen in our clinic, the patient reports doing very well.  She has had 2 knee surgeries this year.  She continues to recover with rehab.  She has not had any sustained arrhythmia symptoms over the past year.  She has very rare palpitations, lasting only a couple seconds.  She has found that by staying hydrated, she does not have SVT.  Today, she denies symptoms of chest pain, shortness of breath,  lower extremity edema, dizziness, presyncope, or syncope.  The patient is otherwise without complaint today.  The patient denies symptoms of fevers, chills, cough, or new SOB worrisome for COVID 19.  Past Medical History:  Diagnosis Date  . Acid reflux   . Adverse effect of general anesthetic    shivering recently-had to have demerol  . Arthritis   . Carpal tunnel syndrome   . Focal nodular hyperplasia of liver surgery in 2004   partial hepactectomy  . GERD (gastroesophageal reflux disease)   . Hypothyroidism   . PONV (postoperative nausea and vomiting)   . SVT (supraventricular tachycardia) ([place]) 03/19/2018   documented with EKG in epic.  . Wears glasses     Past Surgical History:  Procedure Laterality Date  . CARPAL TUNNEL  RELEASE     rt  . CARPAL TUNNEL RELEASE Left 01/12/2013   Procedure: CARPAL TUNNEL RELEASE;  Surgeon: Cammie Sickle., MD;  Location: [place];  Service: Orthopedics;  Laterality: Left;  . CARPOMETACARPEL SUSPENSION PLASTY Left 07/24/2014   Procedure: LEFT THUMB CARPOMETACARPEL ([place]) ARTHROPLASTY GARCIA ELIAS ;  Surgeon: Leanora Cover, MD;  Location: [place];  Service: Orthopedics;  Laterality: Left;  . CHOLECYSTECTOMY  2004   liver resection benign tumor with GB  . COLONOSCOPY  03/05/2011   Procedure: COLONOSCOPY;  Surgeon: Rogene Houston, MD;  [facility];  Service: Endoscopy;  Laterality: N/A;  . DORSAL COMPARTMENT RELEASE Left 01/12/2013   Procedure: RELEASE DORSAL COMPARTMENT (DEQUERVAIN);  Surgeon: Cammie Sickle., MD;  Location: [facility];  Service: Orthopedics;  Laterality: Left;  . ESOPHAGOGASTRODUODENOSCOPY  03/05/2011   Procedure: ESOPHAGOGASTRODUODENOSCOPY (EGD);  Surgeon: Rogene Houston, MD;  [facility];  Service: Endoscopy;  Laterality: N/A;  . ESOPHAGOGASTRODUODENOSCOPY N/A 05/14/2016   Procedure: ESOPHAGOGASTRODUODENOSCOPY (EGD);  Surgeon: Rogene Houston, MD;  [facility];  Service: Endoscopy;  Laterality: N/A;  300  . FRACTURE SURGERY  2011   rt ankle  . PARTIAL HYSTERECTOMY    . STERIOD INJECTION Left 01/12/2013   Procedure: INJECTION LEFT THUMB [place] JOINT;  Surgeon: Cammie Sickle., MD;  Location: [place];  Service: Orthopedics;  Laterality: Left;  .  SVT ABLATION N/A 05/03/2018   Procedure: SVT ABLATION;  Surgeon: Thompson Grayer, MD;  Location: [place] [facility];  Service: Cardiovascular;  Laterality: N/A;  . THYROIDECTOMY, PARTIAL    . TIBIA HARDWARE REMOVAL  2011   rt ankle   . TRIGGER FINGER RELEASE Left 01/12/2013   Procedure: RELEASE TRIGGER FINGER/A-1 PULLEY;  Surgeon: Cammie Sickle., MD;  Location: [place];  Service: Orthopedics;   Laterality: Left;    Current Outpatient Medications  Medication Sig Dispense Refill  . ALPRAZolam (XANAX) 0.5 MG tablet Take 0.25 mg by mouth daily as needed for anxiety.    . Cyclobenzaprine HCl (FLEXERIL PO) Take by mouth.    . esomeprazole (NEXIUM) 20 MG capsule Take 20 mg by mouth daily at 12 noon.    Marland Kitchen levothyroxine (SYNTHROID, LEVOTHROID) 75 MCG tablet Take 75 mcg by mouth daily before breakfast.    . metoprolol succinate (TOPROL-XL) 25 MG 24 hr tablet Take 0.5 tablets (12.5 mg total) by mouth 2 (two) times daily. Please make yearly appt with Dr. Rayann Heman for October for future refills. 1st attempt (Patient taking differently: Take 25 mg by mouth daily. ) 90 tablet 0  . OVER THE COUNTER MEDICATION Apply 1 application topically daily as needed (knee pain). CBD Cream    . Secukinumab (COSENTYX SENSOREADY 300 DOSE) 150 MG/ML SOAJ Take 300 mg by mouth every 30 (thirty) days. Takes injection once a month    . Topiramate ER (TROKENDI XR) 50 MG CP24 Take 50 mg by mouth every evening.      No current facility-administered medications for this visit.     Allergies:   Prednisone, Ciprofloxacin, Dairy aid [lactase], Erythromycin, Nsaids, Other, Sulfa antibiotics, and Wheat bran   Social History:  The patient  reports that she quit smoking about 5 years ago. Her smoking use included cigarettes. She has a 0.13 pack-year smoking history. She has never used smokeless tobacco. She reports current alcohol use. She reports that she does not use drugs.   Family History:  The patient's family history includes Alcohol abuse in her brother; Cirrhosis in her brother and father; Diabetes in her mother.   ROS:  Please see the history of present illness.   All other systems are personally reviewed and negative.    Exam:    Vital Signs:  There were no vitals taken for this visit.  Well sounding, alert and conversant   Labs/Other Tests and Data Reviewed:    Recent Labs: No results found for requested labs  within last 8760 hours.   Wt Readings from Last 3 Encounters:  06/01/18 233 lb (105.7 kg)  05/03/18 220 lb (99.8 kg)  04/15/18 232 lb 6.4 oz (105.4 kg)     ASSESSMENT & PLAN:    1.  Ectopic atrial tachycardia Well controlled Not inducible on prior EP study Continue conservative measures.   Follow-up:  12 month with EP PA   Patient Risk:  after full review of this patients clinical status, I feel that they are at moderate risk at this time.  Today, I have spent 15 minutes with the patient with telehealth technology discussing arrhythmia management .    Army Fossa, MD  06/14/2019 9:44 AM     [facility] [address] [place] [address] [phone] (office) [phone] (fax)

## 2019-06-15 ENCOUNTER — Ambulatory Visit: Payer: Managed Care, Other (non HMO) | Admitting: Physical Therapy

## 2019-07-09 IMAGING — DX DG CHEST 2V
2 series · 2 of 2 positions shown · non-contrast
Comparison: Radiographs July 18, 2012.

CLINICAL DATA: Chest pain.

EXAM:
CHEST - 2 VIEW

[chest pa]
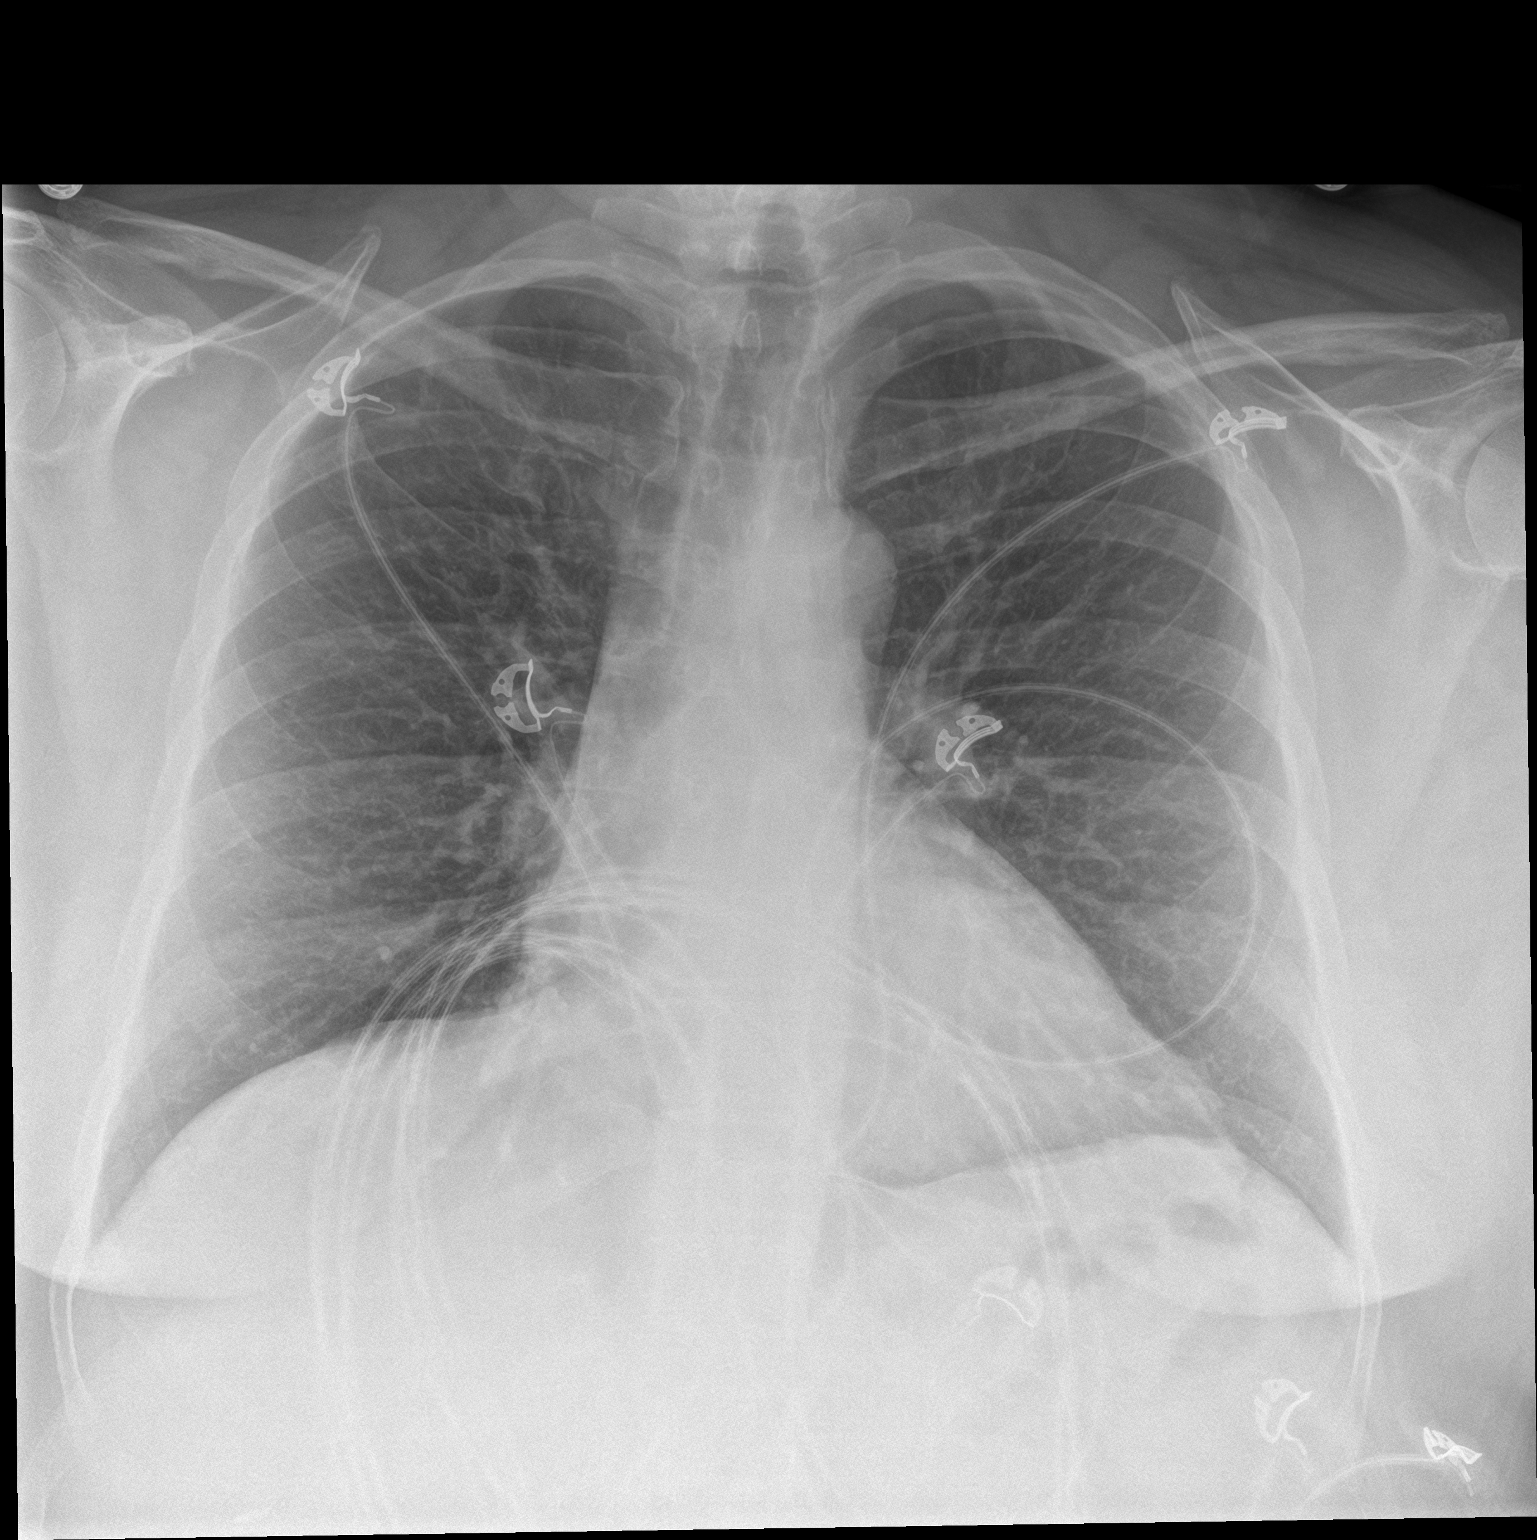

[chest lat]
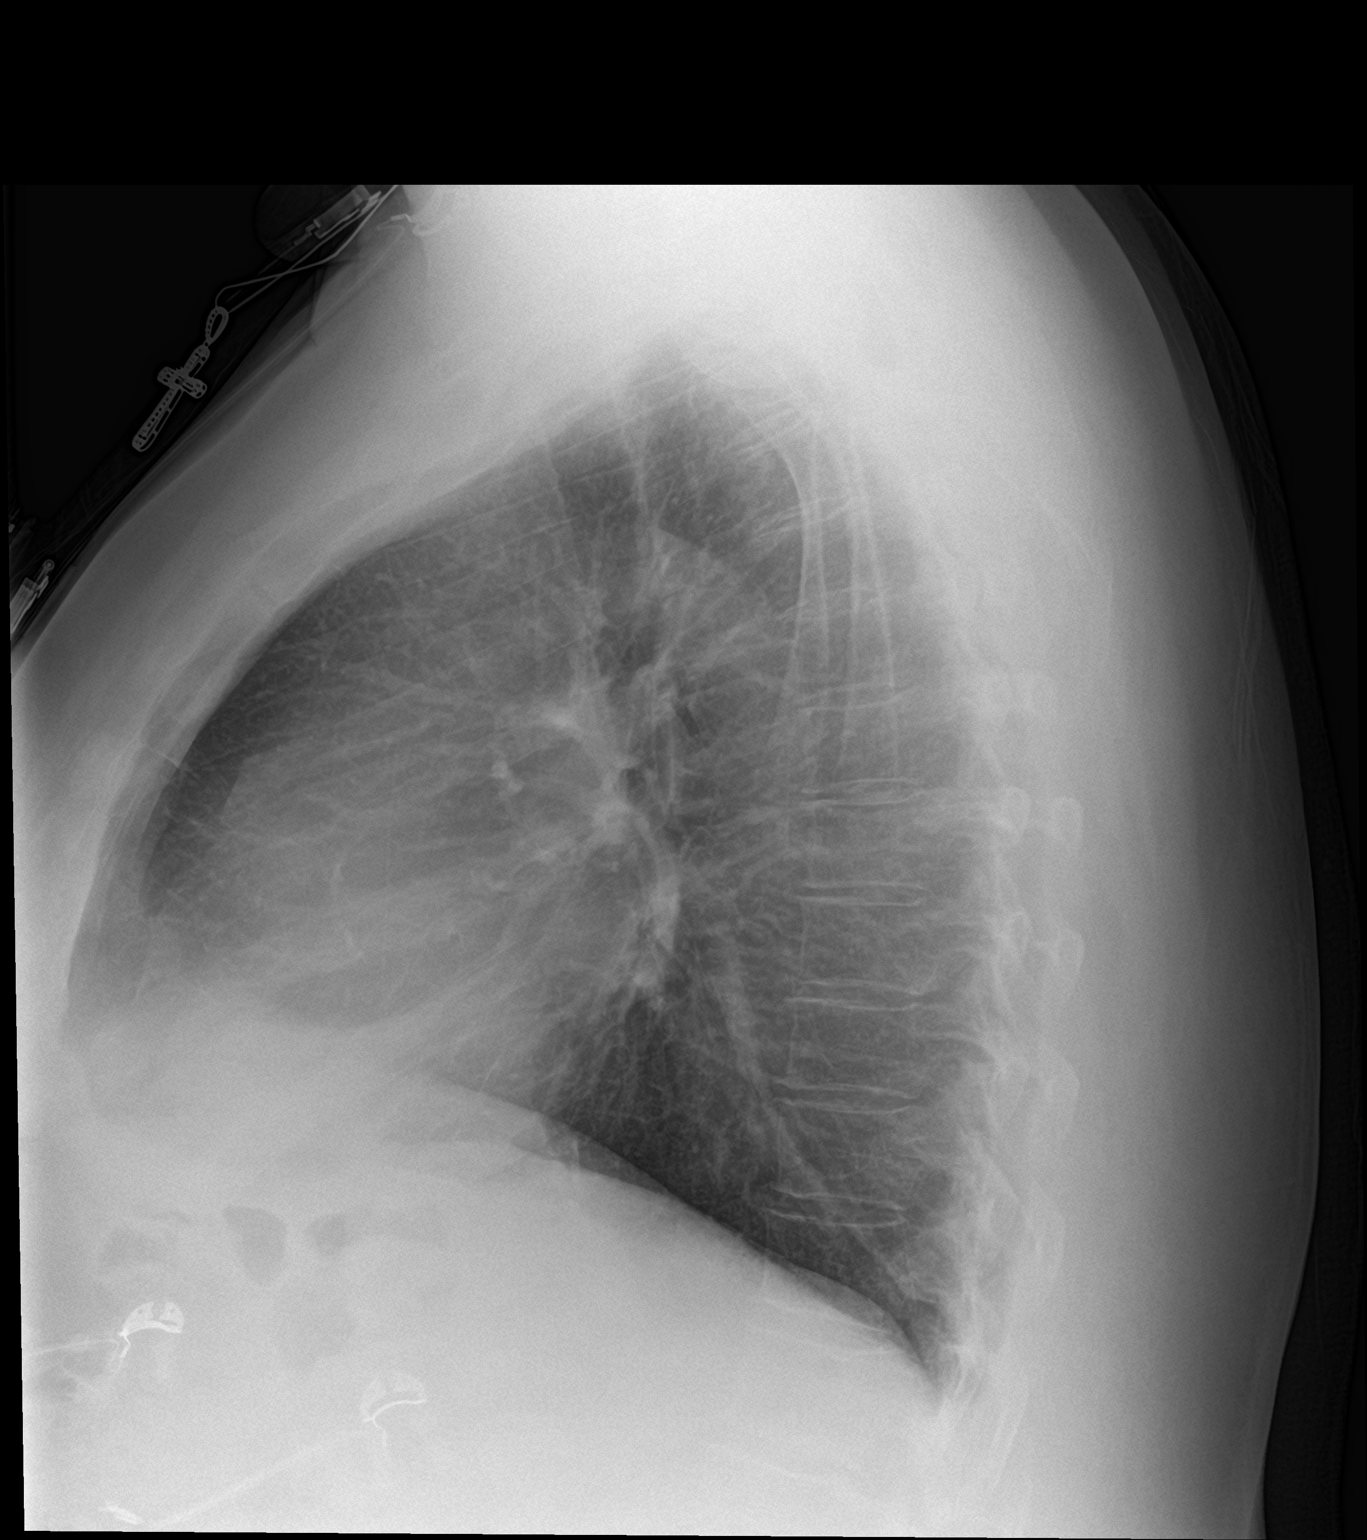

[2 of 2 positions shown; findings below may reference images not displayed]

FINDINGS: The heart size and mediastinal contours are within normal limits.
Both lungs are clear. No pneumothorax or pleural effusion is noted.
The visualized skeletal structures are unremarkable.
IMPRESSION: No active cardiopulmonary disease.

## 2019-07-17 DIAGNOSIS — M2391 Unspecified internal derangement of right knee: Secondary | ICD-10-CM | POA: Insufficient documentation

## 2019-07-17 DIAGNOSIS — M958 Other specified acquired deformities of musculoskeletal system: Secondary | ICD-10-CM | POA: Insufficient documentation

## 2019-07-25 ENCOUNTER — Other Ambulatory Visit: Payer: Self-pay | Admitting: Internal Medicine

## 2019-11-30 ENCOUNTER — Telehealth: Payer: Self-pay | Admitting: Internal Medicine

## 2019-11-30 NOTE — Telephone Encounter (Signed)
Returned call to Pt.  Pt states over the last couple of weeks her palpitations have been increasing especially at night.  She has been taking Toprol XL 25 mg one tablet at lunch time and taking an additional 1/2 tablet at bedtime with no relief.  Pt denies using ANY caffeine and very rarely one drink of alcohol.  Will schedule next available with EP APP to discuss.  Pt in agreement and thanked nurse for help

## 2019-11-30 NOTE — Telephone Encounter (Signed)
New message   STAT if HR is under 50 or over 120 (normal HR is 60-100 beats per minute)  1) What is your heart rate? 73  2) Do you have a log of your heart rate readings (document readings)? No   3) Do you have any other symptoms? Patient states that she has svt at night her hr will go from 70 to 168.

## 2019-12-03 NOTE — Progress Notes (Signed)
Cardiology Office Note Date:  12/05/2019  Patient ID:  Shelly [name], Shelly [name] 1960-09-24, MRN [id number] PCP:  Curly Rim, MD  Cardiologist:  Dr. Bronson Ing EP: Dr. Rayann Heman    Chief Complaint:  palpitations   History of Present Illness: Shelly [name] is a 59 y.o. female with history of hypothyroidism, GERD, symptomatic PVCs, SVT.  She comes in today to be seen for Dr. Rayann Heman.  Referred to him 2019.  He noted: She presented to the hospital 03/19/18 with documented mid RP SVT at 170 bpm.  This spontaneously converted to sinus.   Previously treated with Toprol Episodes occur every 2-3 days.  She is unaware of triggers/ precipitants.  She has tried vagal maneuvers without termination.  She has reduced caffeine and quit CBD oil without improvement.  During episodes she reports associated chest discomfort, dizziness and a sensation that her head is "going to explode". Planned for ablation She had nonsustained atach only which is likely her clinical arrhythmia and was not sustained long enough for mapping/ ablation. At her post ablation visit she preferred not to change her medicines and pursue a more conservative approach going forward.  The patient called recently with an up-tick in her palpitations.  Since her last visit she has done well from an arrhythmia standpoint.  Last year had a number of orthopedic surgeries and a calf injury, but all of these healed up and done well this year.  About 6 weeks ago she noticed that she noticed a couple brief SVTs a week, self limited lasting about a minute.  IN the last weeks or 2, she is getting more frequent and longer lasting episodes,.  95% of them occur at night while in bed, but now always when asleep.  They are racing, feel exactly like her SVT and make her feel terrible pressure in her head, and once felt quite weak. Vagal maneuvers work, though a couple times required an extra 1/2 tab of her Toprol and vagal maneuvers a few times before it  would stop and then feels a big rush afterwards and a little lightheaded momentarily.  No syncope, no CP  Outside if this, she has great exertional capacity, exercises on her stationary bike to her knees' tolerance and is quite active without difficulties, no CP, SOB, DOE.  Past Medical History:  Diagnosis Date  . Acid reflux   . Adverse effect of general anesthetic    shivering recently-had to have demerol  . Arthritis   . Carpal tunnel syndrome   . Focal nodular hyperplasia of liver surgery in 2004   partial hepactectomy  . GERD (gastroesophageal reflux disease)   . Hypothyroidism   . PONV (postoperative nausea and vomiting)   . SVT (supraventricular tachycardia) ([place]) 03/19/2018   documented with EKG in epic.  . Wears glasses     Past Surgical History:  Procedure Laterality Date  . CARPAL TUNNEL RELEASE     rt  . CARPAL TUNNEL RELEASE Left 01/12/2013   Procedure: CARPAL TUNNEL RELEASE;  Surgeon: Cammie Sickle., MD;  Location: [place];  Service: Orthopedics;  Laterality: Left;  . CARPOMETACARPEL SUSPENSION PLASTY Left 07/24/2014   Procedure: LEFT THUMB CARPOMETACARPEL ([place]) ARTHROPLASTY GARCIA ELIAS ;  Surgeon: Leanora Cover, MD;  Location: [place];  Service: Orthopedics;  Laterality: Left;  . CHOLECYSTECTOMY  2004   liver resection benign tumor with GB  . COLONOSCOPY  03/05/2011   Procedure: COLONOSCOPY;  Surgeon: Rogene Houston, MD;  [facility];  Service: Endoscopy;  Laterality: N/A;  . DORSAL COMPARTMENT RELEASE Left 01/12/2013   Procedure: RELEASE DORSAL COMPARTMENT (DEQUERVAIN);  Surgeon: Cammie Sickle., MD;  [facility]  Service: Orthopedics;  Laterality: Left;  . ESOPHAGOGASTRODUODENOSCOPY  03/05/2011   Procedure: ESOPHAGOGASTRODUODENOSCOPY (EGD);  Surgeon: Rogene Houston, MD;  [facility];  Service: Endoscopy;  Laterality: N/A;  . ESOPHAGOGASTRODUODENOSCOPY N/A 05/14/2016    Procedure: ESOPHAGOGASTRODUODENOSCOPY (EGD);  Surgeon: Rogene Houston, MD;  [facility];  Service: Endoscopy;  Laterality: N/A;  300  . FRACTURE SURGERY  2011   rt ankle  . PARTIAL HYSTERECTOMY    . STERIOD INJECTION Left 01/12/2013   Procedure: INJECTION LEFT THUMB [place] JOINT;  Surgeon: Cammie Sickle., MD;  Location: [place];  Service: Orthopedics;  Laterality: Left;  . SVT ABLATION N/A 05/03/2018   Procedure: SVT ABLATION;  Surgeon: Thompson Grayer, MD;  Location: [place] [facility];  Service: Cardiovascular;  Laterality: N/A;  . THYROIDECTOMY, PARTIAL    . TIBIA HARDWARE REMOVAL  2011   rt ankle   . TRIGGER FINGER RELEASE Left 01/12/2013   Procedure: RELEASE TRIGGER FINGER/A-1 PULLEY;  Surgeon: Cammie Sickle., MD;  Location: [place];  Service: Orthopedics;  Laterality: Left;    Current Outpatient Medications  Medication Sig Dispense Refill  . ALPRAZolam (XANAX) 0.5 MG tablet Take 0.25 mg by mouth daily as needed for anxiety.    . Cyclobenzaprine HCl (FLEXERIL PO) Take by mouth.    . esomeprazole (NEXIUM) 20 MG capsule Take 20 mg by mouth daily at 12 noon.    Marland Kitchen levothyroxine (SYNTHROID, LEVOTHROID) 75 MCG tablet Take 75 mcg by mouth daily before breakfast.    . metoprolol succinate (TOPROL-XL) 25 MG 24 hr tablet Take 25 mg by mouth daily.    Marland Kitchen OVER THE COUNTER MEDICATION Apply 1 application topically daily as needed (knee pain). CBD Cream    . Secukinumab (COSENTYX SENSOREADY 300 DOSE) 150 MG/ML SOAJ Take 300 mg by mouth every 30 (thirty) days. Takes injection once a month    . Topiramate ER (TROKENDI XR) 50 MG CP24 Take 50 mg by mouth every evening.      No current facility-administered medications for this visit.    Allergies:   Prednisone, Ciprofloxacin, Dairy aid [lactase], Erythromycin, Nsaids, Other, Sulfa antibiotics, and Wheat bran   Social History:  The patient  reports that she quit smoking about 6 years ago. Her smoking  use included cigarettes. She has a 0.13 pack-year smoking history. She has never used smokeless tobacco. She reports current alcohol use. She reports that she does not use drugs.   Family History:  The patient's family history includes Alcohol abuse in her brother; Cirrhosis in her brother and father; Diabetes in her mother.  ROS:  Please see the history of present illness.  All other systems are reviewed and otherwise negative.   PHYSICAL EXAM:  VS:  BP 130/82   Pulse 74   Ht 5\' 6"  (1.676 m)   Wt 229 lb (103.9 kg)   BMI 36.96 kg/m  BMI: Body mass index is 36.96 kg/m. Well nourished, well developed, in no acute distress  HEENT: normocephalic, atraumatic  Neck: no JVD, carotid bruits or masses Cardiac:  RRR; no significant murmurs, no rubs, or gallops Lungs:  CTA b/l, no wheezing, rhonchi or rales  Abd: soft, nontender MS: no deformity or atrophy Ext: no edema  Skin:  warm and dry, no rash Neuro:  No gross deficits appreciated Psych: euthymic mood, full affect    EKG:  Done today and reviewed by myself shows  SR74bpm, Q in lead III   05/03/2018: EPS CONCLUSIONS:  1. Sinus rhythm upon presentation.  2. No evidence of dual AV nodal physiology or accessory pathways 3. No inducible sustained arrhythmias both on or off of isuprel amenable to mapping or ablation today.  The patient did have 7 seconds of ectopic atrial tachycardia which self terminated before additional maneuvers could be performed. 4. No early apparent complications.     03/21/2018: TTE Study Conclusions  - Left ventricle: The cavity size was normal. Systolic function was  normal. The estimated ejection fraction was in the range of 60%  to 65%. Wall motion was normal; there were no regional wall  motion abnormalities. Left ventricular diastolic function  parameters were normal.  - Mitral valve: Moderately calcified annulus.  - Left atrium: The atrium was moderately dilated.  - Pericardium,  extracardiac: A trivial pericardial effusion was  identified posterior to the heart.    Recent Labs: No results found for requested labs within last 8760 hours.  No results found for requested labs within last 8760 hours.   CrCl cannot be calculated (Patient's most recent lab result is older than the maximum 21 days allowed.).   Wt Readings from Last 3 Encounters:  12/05/19 229 lb (103.9 kg)  06/01/18 233 lb (105.7 kg)  05/03/18 220 lb (99.8 kg)     Other studies reviewed: Additional studies/records reviewed today include: summarized above  ASSESSMENT AND PLAN:  1. Palpitations 2. ATach     H/o PVCs     She says in general the last few months have been the best in the past year, no changes in medicines, no particular life stressors, she does not drink alcohol, has not had caffeine in years, no clear trigger Her TSH in Dec low normal at 0.685 Will repeat today  She would like to avoid an additional medicine, with thoughts towards Flecainide for her. She is more comfortable with increasing her Toprol and revisiting with Dr. Rayann Heman if repeating her EPS would be a reasonable next step for her. She would like to discuss this with him, increase her Toprol to 25mg  BID and schedle a virtual visit with Dr. Rayann Heman in a few weeks.     Disposition: as above  Current medicines are reviewed at length with the patient today.  The patient did not have any concerns regarding medicines.  Venetia Night, PA-C 12/05/2019 3:54 PM     [place] [address] [phone] (office)  [phone] (fax)

## 2019-12-05 ENCOUNTER — Ambulatory Visit (INDEPENDENT_AMBULATORY_CARE_PROVIDER_SITE_OTHER): Payer: 59 | Admitting: Physician Assistant

## 2019-12-05 ENCOUNTER — Other Ambulatory Visit: Payer: Self-pay

## 2019-12-05 VITALS — BP 130/82 | HR 74 | Ht 66.0 in | Wt 229.0 lb

## 2019-12-05 DIAGNOSIS — R002 Palpitations: Secondary | ICD-10-CM

## 2019-12-05 DIAGNOSIS — I471 Supraventricular tachycardia: Secondary | ICD-10-CM

## 2019-12-05 MED ORDER — METOPROLOL SUCCINATE ER 25 MG PO TB24: 25.0000 mg | ORAL_TABLET | Freq: Two times a day (BID) | ORAL | 1 refills | Status: DC

## 2019-12-05 NOTE — Patient Instructions (Addendum)
Medication Instructions:   START TAKING METOPROLOL  25 MG TWICE A DAY   *If you need a refill on your cardiac medications before your next appointment, please call your pharmacy*   Lab Work: TSH TODAY    If you have labs (blood work) drawn today and your tests are completely normal, you will receive your results only by: Marland Kitchen MyChart Message (if you have MyChart) OR . A paper copy in the mail If you have any lab test that is abnormal or we need to change your treatment, we will call you to review the results.   Testing/Procedures: NONE ORDERED  TODAY    Follow-Up: At [facility], you and your health needs are our priority.  As part of our continuing mission to provide you with exceptional heart care, we have created designated Provider Care Teams.  These Care Teams include your primary Cardiologist (physician) and Advanced Practice Providers (APPs -  Physician Assistants and Nurse Practitioners) who all work together to provide you with the care you need, when you need it.  We recommend signing up for the patient portal called "MyChart".  Sign up information is provided on this After Visit Summary.  MyChart is used to connect with patients for Virtual Visits (Telemedicine).  Patients are able to view lab/test results, encounter notes, upcoming appointments, etc.  Non-urgent messages can be sent to your provider as well.   To learn more about what you can do with MyChart, go to [email or web address].    Your next appointment:   2-3  week(s)  The format for your next appointment:   In Person  Provider:   You may see  Dr. Rayann Heman  or one of the following Advanced Practice Providers on your designated Care Team:    Chanetta Marshall, NP  Tommye Standard, PA-C  Legrand Como "Oda Kilts, [place]    Other Instructions

## 2019-12-06 ENCOUNTER — Ambulatory Visit: Payer: 59 | Admitting: Physical Therapy

## 2019-12-06 LAB — TSH: TSH: 1.16 u[IU]/mL (ref 0.450–4.500)

## 2019-12-08 ENCOUNTER — Ambulatory Visit: Payer: 59 | Attending: Anesthesiology | Admitting: Physical Therapy

## 2019-12-08 ENCOUNTER — Other Ambulatory Visit: Payer: Self-pay

## 2019-12-08 DIAGNOSIS — M25561 Pain in right knee: Secondary | ICD-10-CM | POA: Insufficient documentation

## 2019-12-08 DIAGNOSIS — G8929 Other chronic pain: Secondary | ICD-10-CM

## 2019-12-08 DIAGNOSIS — R262 Difficulty in walking, not elsewhere classified: Secondary | ICD-10-CM | POA: Diagnosis present

## 2019-12-08 DIAGNOSIS — M25562 Pain in left knee: Secondary | ICD-10-CM | POA: Diagnosis not present

## 2019-12-08 DIAGNOSIS — M6281 Muscle weakness (generalized): Secondary | ICD-10-CM | POA: Diagnosis present

## 2019-12-08 NOTE — Patient Instructions (Signed)
Access Code: [email or web address] URL: [email or web address] Date: 12/08/2019 Prepared by: Kearney Hard  Exercises Active Straight Leg Raise with Quad Set - 2 x daily - 7 x weekly - 2 sets - 10 reps Modified Thomas Stretch - 2 x daily - 7 x weekly - 3 reps - 30 seconds hold Seated Hip Adduction Squeeze with Ball - 2 x daily - 7 x weekly - 2 sets - 10 reps Hooklying Clamshell with Resistance - 2 x daily - 7 x weekly - 2 sets - 10 reps

## 2019-12-08 NOTE — Therapy (Signed)
[name] [place], [place], [address] Phone: [phone]   Fax:  [phone]  Physical Therapy Treatment  Patient Details  Name: Shelly [name] MRN: [id number] Date of Birth: 1961/03/15 Referring Provider (PT): Mikeal Hawthorne, MD   Encounter Date: 12/08/2019  PT End of Session - 12/08/19 1114    Visit Number  1    Number of Visits  13    Date for PT Re-Evaluation  01/19/20    Authorization Time Period  PROGRESS NOTE AT 10TH VISIT.  KX MODIFIER AFTER 15 VISITS.    PT Start Time  1030    PT Stop Time  1110    PT Time Calculation (min)  40 min    Activity Tolerance  Patient tolerated treatment well    Behavior During Therapy  WFL for tasks assessed/performed       Past Medical History:  Diagnosis Date  . Acid reflux   . Adverse effect of general anesthetic    shivering recently-had to have demerol  . Arthritis   . Carpal tunnel syndrome   . Focal nodular hyperplasia of liver surgery in 2004   partial hepactectomy  . GERD (gastroesophageal reflux disease)   . Hypothyroidism   . PONV (postoperative nausea and vomiting)   . SVT (supraventricular tachycardia) ([place]) 03/19/2018   documented with EKG in epic.  . Wears glasses     Past Surgical History:  Procedure Laterality Date  . CARPAL TUNNEL RELEASE     rt  . CARPAL TUNNEL RELEASE Left 01/12/2013   Procedure: CARPAL TUNNEL RELEASE;  Surgeon: Cammie Sickle., MD;  Location: [facility];  Service: Orthopedics;  Laterality: Left;  . CARPOMETACARPEL SUSPENSION PLASTY Left 07/24/2014   Procedure: LEFT THUMB CARPOMETACARPEL ([place]) ARTHROPLASTY GARCIA ELIAS ;  Surgeon: Leanora Cover, MD;  Location: [facility];  Service: Orthopedics;  Laterality: Left;  . CHOLECYSTECTOMY  2004   liver resection benign tumor with GB  . COLONOSCOPY  03/05/2011   Procedure: COLONOSCOPY;  Surgeon: Rogene Houston, MD;  [facility];  Service: Endoscopy;   Laterality: N/A;  . DORSAL COMPARTMENT RELEASE Left 01/12/2013   Procedure: RELEASE DORSAL COMPARTMENT (DEQUERVAIN);  Surgeon: Cammie Sickle., MD;  Location[facility];  Service: Orthopedics;  Laterality: Left;  . ESOPHAGOGASTRODUODENOSCOPY  03/05/2011   Procedure: ESOPHAGOGASTRODUODENOSCOPY (EGD);  Surgeon: Rogene Houston, MD;  [facility];  Service: Endoscopy;  Laterality: N/A;  . ESOPHAGOGASTRODUODENOSCOPY N/A 05/14/2016   Procedure: ESOPHAGOGASTRODUODENOSCOPY (EGD);  Surgeon: Rogene Houston, MD;  [facility];  Service: Endoscopy;  Laterality: N/A;  300  . FRACTURE SURGERY  2011   rt ankle  . PARTIAL HYSTERECTOMY    . STERIOD INJECTION Left 01/12/2013   Procedure: INJECTION LEFT THUMB [place] JOINT;  Surgeon: Cammie Sickle., MD;  Location: [facility];  Service: Orthopedics;  Laterality: Left;  . SVT ABLATION N/A 05/03/2018   Procedure: SVT ABLATION;  Surgeon: Thompson Grayer, MD;  Location: [place] [facility];  Service: Cardiovascular;  Laterality: N/A;  . THYROIDECTOMY, PARTIAL    . TIBIA HARDWARE REMOVAL  2011   rt ankle   . TRIGGER FINGER RELEASE Left 01/12/2013   Procedure: RELEASE TRIGGER FINGER/A-1 PULLEY;  Surgeon: Cammie Sickle., MD;  Location: [place];  Service: Orthopedics;  Laterality: Left;    There were no vitals filed for this visit.  Subjective Assessment - 12/08/19 1032  Subjective  COVID 19 screening performed prior to entering building. 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 Pt reporting pain in Right knee of 2/10. Pt reporting tomorrow her pain will be worse due to the rain coming. Pt also dx with psoriatic arthritis and pt reporting her arthritis is acting up.    Pertinent History  5 knee surgeries (bilateral). SVT, CTS, PVNS.    How long can you stand comfortably?  10 minutes.    How long can you walk comfortably?   10 minutes    Patient Stated Goals  Stop hurting, be more active, walk with pain    Currently in Pain?  Yes    Pain Score  2     Pain Location  Knee    Pain Orientation  Right    Pain Descriptors / Indicators  Aching    Pain Type  Acute pain    Pain Onset  1 to 4 weeks ago    Pain Frequency  Intermittent    Aggravating Factors   activity is worse at end of day         [facility] PT Assessment - 12/08/19 0001      Assessment   Medical Diagnosis  bilateral knee pain, worse on right     Referring Provider (PT)  Zada Finders Ward, MD    Onset Date/Surgical Date  --   04/20/2019   Hand Dominance  Right    Prior Therapy  yes last year immediate following surgery      Precautions   Precautions  None      Restrictions   Weight Bearing Restrictions  No      Balance Screen   Has the patient fallen in the past 6 months  No    Is the patient reluctant to leave their home because of a fear of falling?   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      Cognition   Overall Cognitive Status  Within Functional Limits for tasks assessed      Observation/Other Assessments   Focus on Therapeutic Outcomes (FOTO)   deferred due to multiple joint      ROM / Strength   AROM / PROM / Strength  AROM;Strength      AROM   AROM Assessment Site  Knee    Right/Left Knee  Right;Left      Strength   Overall Strength Comments  pain MMT on R knee with flexion and extension    Strength Assessment Site  Hip;Knee    Right/Left Hip  Right;Left    Right Hip Flexion  4/5    Right Hip ABduction  4/5    Right Hip ADduction  4/5    Left Hip Flexion  4/5    Left Hip ABduction  4/5    Left Hip ADduction  4/5    Right/Left Knee  Right;Left    Right Knee Flexion  4+/5    Right Knee Extension  4+/5    Left Knee Flexion  4+/5     Left Knee Extension  4+/5      Palpation   Patella mobility  tenderness noted L  medial lateral movements    Palpation comment  TTP lateral  joint line on R knee      Special Tests   Other special tests  +Thomas Test bilaterally      Transfers   Five time sit to stand comments   26.77 seconds using UE support      Ambulation/Gait   Gait Comments  Pt reporting at times she definitely favors her L knee due to left knee pain.       Balance   Balance Assessed  --   SLS bilaterally 30 seconds                               PT Long Term Goals - 12/08/19 1103      PT LONG TERM GOAL #1   Title  Independent with a HEP.    Time  6    Period  Weeks    Status  New      PT LONG TERM GOAL #2   Title  Pt will improve her bilateral hip strength to 5/5 in order to improve functional mobility.    Time  6    Period  Weeks    Status  New      PT LONG TERM GOAL #3   Title  Pt will be able to improve her 5 times sit to stand to </= 18 seconds to improve function.    Baseline  see flow sheets    Time  6    Period  Weeks    Status  New      PT LONG TERM GOAL #4   Title  Perform ADL's or increased activities  with pain </= 3/10 the following day.    Time  6    Period  Weeks    Status  New      PT LONG TERM GOAL #5   Title  Pt will be able to navigate 5  stairs with single hand rail with pain </= 2/10.    Baseline  pain with 2 steps, using hand rails and increasd pain of 8/10.    Time  6    Period  Weeks    Status  New            Plan - 12/08/19 1116    Clinical Impression Statement  Shelly [name] arrived today reporting pain in bilateral knees. Pt reporting more pain in her R knee than left. Pt reporting difficutly with activity tolerance and when she is active her pain is worse the following day. Pt presenting with L AROM arc of 0-125 degrees and R AROM arc 0-120 degrees. Pt is tender to palpation along lateral joint line of her R knee and L pateall with  med/lateral movement. Pt with mild weakenss noted in bilateral hips and R knee. Pt reporting difficulty with ADL's/ prolonged walking and stairs. Skilled PT needed to adddress pt's impairments with below interventions.    Personal Factors and Comorbidities  Comorbidity 3+    Comorbidities  5 knee surgeries (bilateral). SVT, CTS, PVNS, psoriatic arthritis, fibromyalgia, R ankle fx, carpel tunnel, Pt is seeing a Rheumatologist.    Stability/Clinical Decision Making  Evolving/Moderate complexity    Clinical Decision Making  Moderate    Rehab Potential  Good    PT Frequency  2x / week    PT Duration  6 weeks    PT Treatment/Interventions  ADLs/Self Care Home Management;Cryotherapy;Electrical Stimulation;Gait training;Ultrasound;Moist  Heat;Stair training;Functional mobility training;Iontophoresis 4mg /ml Dexamethasone;Therapeutic activities;Therapeutic exercise;Manual techniques;Patient/family education;Passive range of motion;Taping;Dry needling;Neuromuscular re-education    PT Next Visit Plan  LE gentle strengthening, Hamstring Stretching, bike low level resistance, Modalities as needed    PT Home Exercise Plan  see pt instructions    Consulted and Agree with Plan of Care  Patient       Patient will benefit from skilled therapeutic intervention in order to improve the following deficits and impairments:  Abnormal gait, Decreased activity tolerance, Decreased strength, Decreased range of motion, Difficulty walking, Increased edema, Pain  Visit Diagnosis: Acute pain of left knee  Chronic pain of right knee  Muscle weakness (generalized)  Difficulty in walking, not elsewhere classified     Problem List Patient Active Problem List   Diagnosis Date Noted  . SVT (supraventricular tachycardia) ([place]) 03/19/2018  . Hypothyroidism 03/19/2018  . Hypokalemia 03/19/2018  . Hyperglycemia 03/19/2018  . Barretts esophagus 03/25/2016  . Dysphagia 04/07/2012  . Fibromyalgia 04/07/2012  . GERD  (gastroesophageal reflux disease) 04/07/2012  . Hypothyroid 04/07/2012  . Shingles 04/07/2012    Oretha Caprice, MPT 12/08/2019, 11:23 AM  [facility] [address] [place], [place], [address] Phone: [phone]   Fax:  [phone]  Name: Shelly [name] MRN: [id number] Date of Birth: 1961-01-15

## 2019-12-11 ENCOUNTER — Ambulatory Visit: Payer: 59 | Admitting: Physical Therapy

## 2019-12-11 ENCOUNTER — Encounter: Payer: Self-pay | Admitting: Physical Therapy

## 2019-12-11 DIAGNOSIS — R262 Difficulty in walking, not elsewhere classified: Secondary | ICD-10-CM

## 2019-12-11 DIAGNOSIS — G8929 Other chronic pain: Secondary | ICD-10-CM

## 2019-12-11 DIAGNOSIS — M25562 Pain in left knee: Secondary | ICD-10-CM | POA: Diagnosis not present

## 2019-12-11 DIAGNOSIS — M6281 Muscle weakness (generalized): Secondary | ICD-10-CM

## 2019-12-11 NOTE — Therapy (Addendum)
[facility] [place], [place], [address] Phone: [phone]   Fax:  [phone]  Physical Therapy Treatment  Patient Details  Name: GENESE QUEBEDEAUX MRN: [id number] Date of Birth: 06-20-1961 Referring Provider (PT): Mikeal Hawthorne, MD   Encounter Date: 12/11/2019  PT End of Session - 12/11/19 1504     Visit Number  2    Number of Visits  13    Date for PT Re-Evaluation  01/19/20    Authorization Time Period  PROGRESS NOTE AT 10TH VISIT.  KX MODIFIER AFTER 15 VISITS.    PT Start Time  0227    PT Stop Time  0314    PT Time Calculation (min)  47 min    Activity Tolerance  Patient tolerated treatment well    Behavior During Therapy  [facility] for tasks assessed/performed        Past Medical History:  Diagnosis Date   Acid reflux    Adverse effect of general anesthetic    shivering recently-had to have demerol   Arthritis    Carpal tunnel syndrome    Focal nodular hyperplasia of liver surgery in 2004   partial hepactectomy   GERD (gastroesophageal reflux disease)    Hypothyroidism    PONV (postoperative nausea and vomiting)    SVT (supraventricular tachycardia) ([place]) 03/19/2018   documented with EKG in epic.   Wears glasses     Past Surgical History:  Procedure Laterality Date   CARPAL TUNNEL RELEASE     rt   CARPAL TUNNEL RELEASE Left 01/12/2013   Procedure: CARPAL TUNNEL RELEASE;  Surgeon: Cammie Sickle., MD;  Location: [place];  Service: Orthopedics;  Laterality: Left;   CARPOMETACARPEL SUSPENSION PLASTY Left 07/24/2014   Procedure: LEFT THUMB CARPOMETACARPEL ([place]) ARTHROPLASTY GARCIA ELIAS ;  Surgeon: Leanora Cover, MD;  Location: [place];  Service: Orthopedics;  Laterality: Left;   CHOLECYSTECTOMY  2004   liver resection benign tumor with GB   COLONOSCOPY  03/05/2011   Procedure: COLONOSCOPY;  Surgeon: Rogene Houston, MD;  [facility];  Service: Endoscopy;   Laterality: N/A;   DORSAL COMPARTMENT RELEASE Left 01/12/2013   Procedure: RELEASE DORSAL COMPARTMENT (DEQUERVAIN);  Surgeon: Cammie Sickle., MD;  Location: [facility];  Service: Orthopedics;  Laterality: Left;   ESOPHAGOGASTRODUODENOSCOPY  03/05/2011   Procedure: ESOPHAGOGASTRODUODENOSCOPY (EGD);  Surgeon: Rogene Houston, MD;  [facility];  Service: Endoscopy;  Laterality: N/A;   ESOPHAGOGASTRODUODENOSCOPY N/A 05/14/2016   Procedure: ESOPHAGOGASTRODUODENOSCOPY (EGD);  Surgeon: Rogene Houston, MD;  [facility];  Service: Endoscopy;  Laterality: N/A;  300   FRACTURE SURGERY  2011   rt ankle   PARTIAL HYSTERECTOMY     STERIOD INJECTION Left 01/12/2013   Procedure: INJECTION LEFT THUMB [place] JOINT;  Surgeon: Cammie Sickle., MD;  Location: [place];  Service: Orthopedics;  Laterality: Left;   SVT ABLATION N/A 05/03/2018   Procedure: SVT ABLATION;  Surgeon: Thompson Grayer, MD;  Location: [place] [facility];  Service: Cardiovascular;  Laterality: N/A;   THYROIDECTOMY, PARTIAL     TIBIA HARDWARE REMOVAL  2011   rt ankle    TRIGGER FINGER RELEASE Left 01/12/2013   Procedure: RELEASE TRIGGER FINGER/A-1 PULLEY;  Surgeon: Cammie Sickle., MD;  Location: [place];  Service: Orthopedics;  Laterality: Left;    There were no vitals filed for this visit.  Subjective Assessment -  12/11/19 1435     Subjective  COVID 19 screening performed prior to entering building. Patient arrived with some discomfort in right knee and left ankle    Pertinent History  5 knee surgeries (bilateral). SVT, CTS, PVNS.    How long can you stand comfortably?  10 minutes.    How long can you walk comfortably?  10 minutes    Patient Stated Goals  Stop hurting, be more active, walk with pain    Currently in Pain?  Yes    Pain Score  3     Pain Location  Knee    Pain Orientation  Right    Pain Descriptors / Indicators  Aching    Pain Type  Acute  pain    Pain Onset  1 to 4 weeks ago    Pain Frequency  Intermittent    Aggravating Factors   proong standing    Pain Relieving Factors  rest                        [facility] Adult PT Treatment/Exercise - 12/11/19 0001       Exercises   Exercises  Knee/Hip      Knee/Hip Exercises: Aerobic   Nustep  N1 x10 min UE/LE      Knee/Hip Exercises: Standing   Rocker Board  3 minutes   balance and stretch   Rocker Board Limitations  discomfort in ankle, DC exercise      Knee/Hip Exercises: Seated   Long Arc Quad  Strengthening;Right   2x10 no weight with ball squeeze     Knee/Hip Exercises: Supine   Short Arc Quad Sets  Strengthening;Right   unable due to pain   Bridges  Strengthening;Both;20 reps    Straight Leg Raises  Strengthening;Right;2 sets;10 reps    Other Supine Knee/Hip Exercises  ball squeeze x20 with 5 sec holds      Modalities   Modalities  Moist Heat;Electrical Stimulation      Moist Heat Therapy   Number Minutes Moist Heat  15 Minutes    Moist Heat Location  Knee   right     Electrical Stimulation   Electrical Stimulation Location  Right knee    Electrical Stimulation Action  IFC    Electrical Stimulation Parameters  1-'10hz'  x54mn    Electrical Stimulation Goals  Pain                   PT Long Term Goals - 12/11/19 1516       PT LONG TERM GOAL #1   Title  Independent with a HEP.    Time  6    Period  Weeks    Status  On-going      PT LONG TERM GOAL #2   Title  Pt will improve her bilateral hip strength to 5/5 in order to improve functional mobility.    Time  6    Period  Weeks    Status  On-going      PT LONG TERM GOAL #3   Title  Pt will be able to improve her 5 times sit to stand to </= 18 seconds to improve function.    Baseline  see flow sheets    Time  6    Period  Weeks    Status  On-going      PT LONG TERM GOAL #4   Title  Perform ADL's or increased activities  with pain </= 3/10 the following  day.    Time  6     Period  Weeks    Status  On-going      PT LONG TERM GOAL #5   Title  Pt will be able to navigate 5  stairs with single hand rail with pain </= 2/10.    Baseline  pain with 2 steps, using hand rails and increasd pain of 8/10.    Time  6    Period  Weeks    Status  On-going             Plan - 12/11/19 1510     Clinical Impression Statement  Patient tolerated treatment fair due to increased soreness in right knee with activity. Focused on gentle exercises today and pain continued to increase. Limited exercises today and required modalities. Normal response to modalities. Felt better after treatment.    Personal Factors and Comorbidities  Comorbidity 3+    Comorbidities  5 knee surgeries (bilateral). SVT, CTS, PVNS, psoriatic arthritis, fibromyalgia, R ankle fx, carpel tunnel, Pt is seeing a Rheumatologist.    Stability/Clinical Decision Making  Evolving/Moderate complexity    Rehab Potential  Good    PT Frequency  2x / week    PT Duration  6 weeks    PT Treatment/Interventions  ADLs/Self Care Home Management;Cryotherapy;Electrical Stimulation;Gait training;Ultrasound;Moist Heat;Stair training;Functional mobility training;Iontophoresis 52m/ml Dexamethasone;Therapeutic activities;Therapeutic exercise;Manual techniques;Patient/family education;Passive range of motion;Taping;Dry needling;Neuromuscular re-education    PT Next Visit Plan  asess and cont with POC for LE gentle strengthening, Hamstring Stretching  Modalities as needed    Consulted and Agree with Plan of Care  Patient        Patient will benefit from skilled therapeutic intervention in order to improve the following deficits and impairments:  Abnormal gait, Decreased activity tolerance, Decreased strength, Decreased range of motion, Difficulty walking, Increased edema, Pain  Visit Diagnosis: Chronic pain of right knee  Acute pain of left knee  Muscle weakness (generalized)  Difficulty in walking, not elsewhere  classified     Problem List Patient Active Problem List   Diagnosis Date Noted   SVT (supraventricular tachycardia) ([place] 03/19/2018   Hypothyroidism 03/19/2018   Hypokalemia 03/19/2018   Hyperglycemia 03/19/2018   Barretts esophagus 03/25/2016   Dysphagia 04/07/2012   Fibromyalgia 04/07/2012   GERD (gastroesophageal reflux disease) 04/07/2012   Hypothyroid 04/07/2012   Shingles 04/07/2012    Hilda Wexler P, PTA 12/11/2019, 3:21 PM  [facility] [address] [address]Phone: [phone]  Fax:  [phone] Name: NWENONA MAYVILLEMRN: [id number] of Birth: 2November 07, 1962 PHYSICAL THERAPY DISCHARGE SUMMARY  Visits from Start of Care: 2.  Current functional level related to goals / functional outcomes: See above.   Remaining deficits: See goal section.   Education / Equipment: HEP.   Patient agrees to discharge. Patient goals were not met. Patient is being discharged due to   .   CMaliApplegate MPT

## 2019-12-20 ENCOUNTER — Telehealth (INDEPENDENT_AMBULATORY_CARE_PROVIDER_SITE_OTHER): Payer: 59 | Admitting: Internal Medicine

## 2019-12-20 ENCOUNTER — Other Ambulatory Visit: Payer: Self-pay

## 2019-12-20 ENCOUNTER — Encounter: Payer: Self-pay | Admitting: Internal Medicine

## 2019-12-20 VITALS — Ht 66.0 in | Wt 225.0 lb

## 2019-12-20 DIAGNOSIS — I471 Supraventricular tachycardia: Secondary | ICD-10-CM

## 2019-12-20 DIAGNOSIS — R002 Palpitations: Secondary | ICD-10-CM | POA: Diagnosis not present

## 2019-12-20 MED ORDER — FLECAINIDE ACETATE 50 MG PO TABS
50.0000 mg | ORAL_TABLET | Freq: Two times a day (BID) | ORAL | 3 refills | Status: DC
Start: 2019-12-20 — End: 2020-12-16

## 2019-12-20 NOTE — Progress Notes (Signed)
Electrophysiology TeleHealth Note  Due to national recommendations of social distancing due to [place] 19, an audio telehealth visit is felt to be most appropriate for this patient at this time.  Verbal consent was obtained by me for the telehealth visit today.  The patient does not have capability for a virtual visit.  A phone visit is therefore required today.   Date:  12/20/2019   ID:  Shelly [name], DOB 11-Jan-1961, MRN [id number]  Location: patient's home  Provider location:  [place]  Evaluation Performed: Follow-up visit  PCP:  Corrington, Kip A, MD   Electrophysiologist:  Dr Rayann Heman  Chief Complaint:  palpitations  History of Present Illness:    Shelly [name] is a 59 y.o. female who presents via telehealth conferencing today.  Since last being seen in our clinic, the patient reports doing reasonably well. She has noticed increased palpitations over the past few weeks. She has increased her metoprolol without improvement.  Today, she denies symptoms of  chest pain, shortness of breath,  lower extremity edema, dizziness, presyncope, or syncope.  The patient is otherwise without complaint today.     Past Medical History:  Diagnosis Date  . Acid reflux   . Adverse effect of general anesthetic    shivering recently-had to have demerol  . Arthritis   . Carpal tunnel syndrome   . Focal nodular hyperplasia of liver surgery in 2004   partial hepactectomy  . GERD (gastroesophageal reflux disease)   . Hypothyroidism   . PONV (postoperative nausea and vomiting)   . SVT (supraventricular tachycardia) ([place]) 03/19/2018   documented with EKG in epic.  . Wears glasses     Past Surgical History:  Procedure Laterality Date  . CARPAL TUNNEL RELEASE     rt  . CARPAL TUNNEL RELEASE Left 01/12/2013   Procedure: CARPAL TUNNEL RELEASE;  Surgeon: Cammie Sickle., MD;  Location: [place];  Service: Orthopedics;  Laterality: Left;  . CARPOMETACARPEL SUSPENSION  PLASTY Left 07/24/2014   Procedure: LEFT THUMB CARPOMETACARPEL ([place]) ARTHROPLASTY GARCIA ELIAS ;  Surgeon: Leanora Cover, MD;  Location: [place];  Service: Orthopedics;  Laterality: Left;  . CHOLECYSTECTOMY  2004   liver resection benign tumor with GB  . COLONOSCOPY  03/05/2011   Procedure: COLONOSCOPY;  Surgeon: Rogene Houston, MD;  [facility];  Service: Endoscopy;  Laterality: N/A;  . DORSAL COMPARTMENT RELEASE Left 01/12/2013   Procedure: RELEASE DORSAL COMPARTMENT (DEQUERVAIN);  Surgeon: Cammie Sickle., MD;  Location: [facility];  Service: Orthopedics;  Laterality: Left;  . ESOPHAGOGASTRODUODENOSCOPY  03/05/2011   Procedure: ESOPHAGOGASTRODUODENOSCOPY (EGD);  Surgeon: Rogene Houston, MD;  [facility];  Service: Endoscopy;  Laterality: N/A;  . ESOPHAGOGASTRODUODENOSCOPY N/A 05/14/2016   Procedure: ESOPHAGOGASTRODUODENOSCOPY (EGD);  Surgeon: Rogene Houston, MD;  [facility];  Service: Endoscopy;  Laterality: N/A;  300  . FRACTURE SURGERY  2011   rt ankle  . PARTIAL HYSTERECTOMY    . STERIOD INJECTION Left 01/12/2013   Procedure: INJECTION LEFT THUMB [place] JOINT;  Surgeon: Cammie Sickle., MD;  Location: [place];  Service: Orthopedics;  Laterality: Left;  . SVT ABLATION N/A 05/03/2018   Procedure: SVT ABLATION;  Surgeon: Thompson Grayer, MD;  Location: [place] [facility];  Service: Cardiovascular;  Laterality: N/A;  . THYROIDECTOMY, PARTIAL    . TIBIA HARDWARE REMOVAL  2011   rt ankle   .  TRIGGER FINGER RELEASE Left 01/12/2013   Procedure: RELEASE TRIGGER FINGER/A-1 PULLEY;  Surgeon: Cammie Sickle., MD;  Location: [place];  Service: Orthopedics;  Laterality: Left;    Current Outpatient Medications  Medication Sig Dispense Refill  . Cyclobenzaprine HCl (FLEXERIL PO) Take 10 mg by mouth as needed (pain).     Marland Kitchen esomeprazole (NEXIUM) 20 MG capsule Take 20 mg by mouth daily at 12  noon.    . Guselkumab (TREMFYA) 100 MG/ML SOSY Inject 1 Dose into the skin every 8 (eight) weeks.    Marland Kitchen levothyroxine (SYNTHROID, LEVOTHROID) 75 MCG tablet Take 75 mcg by mouth daily before breakfast.    . metoprolol succinate (TOPROL-XL) 25 MG 24 hr tablet Take 1 tablet (25 mg total) by mouth 2 (two) times daily. (Patient taking differently: Take 25 mg by mouth 3 (three) times daily. ) 180 tablet 1  . OVER THE COUNTER MEDICATION Apply 1 application topically daily as needed (knee pain). CBD Cream    . Secukinumab (COSENTYX SENSOREADY 300 DOSE) 150 MG/ML SOAJ Take 300 mg by mouth every 30 (thirty) days. Takes injection once a month     No current facility-administered medications for this visit.    Allergies:   Prednisone, Ciprofloxacin, Dairy aid [lactase], Erythromycin, Nsaids, Other, Sulfa antibiotics, and Wheat bran   Social History:  The patient  reports that she quit smoking about 6 years ago. Her smoking use included cigarettes. She has a 0.13 pack-year smoking history. She has never used smokeless tobacco. She reports current alcohol use. She reports that she does not use drugs.   ROS:  Please see the history of present illness.   All other systems are personally reviewed and negative.    Exam:    Vital Signs:  Ht 5\' 6"  (1.676 m)   Wt 225 lb (102.1 kg)   BMI 36.32 kg/m   Well sounding, alert and conversant   Labs/Other Tests and Data Reviewed:    Recent Labs: 12/05/2019: TSH 1.160   Wt Readings from Last 3 Encounters:  12/20/19 225 lb (102.1 kg)  12/05/19 229 lb (103.9 kg)  06/01/18 233 lb (105.7 kg)       ASSESSMENT & PLAN:    1.  Atrial tachycardia Recently increased Not inducible on prior ep study I would advise medical therapy Reduce toprol to 25mg  dialy Start flecainide 50mg  BID   Follow-up:  Return to see EP NP in 6 weeks   Patient Risk:  after full review of this patients clinical status, I feel that they are at moderate risk at this time.  Today, I  have spent 15 minutes with the patient with telehealth technology discussing arrhythmia management .    Army Fossa, MD  12/20/2019 4:58 PM     [facility] [place] [address] [phone] (office) [phone] (fax)

## 2019-12-22 ENCOUNTER — Telehealth: Payer: Self-pay | Admitting: *Deleted

## 2019-12-22 MED ORDER — METOPROLOL SUCCINATE ER 25 MG PO TB24
25.0000 mg | ORAL_TABLET | Freq: Every day | ORAL | 3 refills | Status: DC
Start: 2019-12-22 — End: 2021-03-21

## 2019-12-22 NOTE — Telephone Encounter (Signed)
Followed up w/ pt. She reports she started the Flecainide yesterday. Advised I would update Toprol on her med list to reflect 25 mg once daily. Patient verbalized understanding and agreeable to plan.

## 2019-12-22 NOTE — Telephone Encounter (Signed)
-----   Message from Thompson Grayer, MD sent at 12/20/2019  9:01 PM EDT ----- Flecainide 50mg  BID Reduce toprol to 25mg  daily  Follow-up with Amber in 6 weeks or closest available

## 2020-02-02 ENCOUNTER — Other Ambulatory Visit: Payer: Self-pay

## 2020-02-02 ENCOUNTER — Encounter: Payer: Self-pay | Admitting: Nurse Practitioner

## 2020-02-02 ENCOUNTER — Ambulatory Visit (INDEPENDENT_AMBULATORY_CARE_PROVIDER_SITE_OTHER): Payer: 59 | Admitting: Nurse Practitioner

## 2020-02-02 VITALS — BP 126/82 | HR 60 | Ht 66.0 in | Wt 231.0 lb

## 2020-02-02 DIAGNOSIS — I471 Supraventricular tachycardia: Secondary | ICD-10-CM | POA: Diagnosis not present

## 2020-02-02 DIAGNOSIS — R002 Palpitations: Secondary | ICD-10-CM

## 2020-02-02 NOTE — Progress Notes (Signed)
Electrophysiology Office Note Date: 02/02/2020  ID:  Shelly, [name] 06-21-1961, MRN [id number]  PCP: [name], NP Electrophysiologist: Rayann Heman  CC: AT follow up  Shelly [name] is a 59 y.o. female seen today for Dr Rayann Heman.  She presents today for routine electrophysiology followup.  Since last being seen in our clinic, the patient reports doing very well.  Flecainide has significantly reduced palpitations. She denies chest pain, palpitations, dyspnea, PND, orthopnea, nausea, vomiting, dizziness, syncope, edema, weight gain, or early satiety.  Past Medical History:  Diagnosis Date  . Acid reflux   . Adverse effect of general anesthetic    shivering recently-had to have demerol  . Arthritis   . Carpal tunnel syndrome   . Focal nodular hyperplasia of liver surgery in 2004   partial hepactectomy  . GERD (gastroesophageal reflux disease)   . Hypothyroidism   . PONV (postoperative nausea and vomiting)   . SVT (supraventricular tachycardia) ([place]) 03/19/2018   documented with EKG in epic.  . Wears glasses    Past Surgical History:  Procedure Laterality Date  . CARPAL TUNNEL RELEASE     rt  . CARPAL TUNNEL RELEASE Left 01/12/2013   Procedure: CARPAL TUNNEL RELEASE;  Surgeon: Cammie Sickle., MD;  Location: [place];  Service: Orthopedics;  Laterality: Left;  . CARPOMETACARPEL SUSPENSION PLASTY Left 07/24/2014   Procedure: LEFT THUMB CARPOMETACARPEL ([place]) ARTHROPLASTY GARCIA ELIAS ;  Surgeon: Leanora Cover, MD;  Location: [place];  Service: Orthopedics;  Laterality: Left;  . CHOLECYSTECTOMY  2004   liver resection benign tumor with GB  . COLONOSCOPY  03/05/2011   Procedure: COLONOSCOPY;  Surgeon: Rogene Houston, MD;  [facility];  Service: Endoscopy;  Laterality: N/A;  . DORSAL COMPARTMENT RELEASE Left 01/12/2013   Procedure: RELEASE DORSAL COMPARTMENT (DEQUERVAIN);  Surgeon: Cammie Sickle., MD;  [facility]: [facility];  Service: Orthopedics;  Laterality: Left;  . ESOPHAGOGASTRODUODENOSCOPY  03/05/2011   Procedure: ESOPHAGOGASTRODUODENOSCOPY (EGD);  Surgeon: Rogene Houston, MD;  [facility];  Service: Endoscopy;  Laterality: N/A;  . ESOPHAGOGASTRODUODENOSCOPY N/A 05/14/2016   Procedure: ESOPHAGOGASTRODUODENOSCOPY (EGD);  Surgeon: Rogene Houston, MD;  [facility];  Service: Endoscopy;  Laterality: N/A;  300  . FRACTURE SURGERY  2011   rt ankle  . PARTIAL HYSTERECTOMY    . STERIOD INJECTION Left 01/12/2013   Procedure: INJECTION LEFT THUMB [place] JOINT;  Surgeon: Cammie Sickle., MD;  Location: [place];  Service: Orthopedics;  Laterality: Left;  . SVT ABLATION N/A 05/03/2018   Procedure: SVT ABLATION;  Surgeon: Thompson Grayer, MD;  Location: [place] [facility];  Service: Cardiovascular;  Laterality: N/A;  . THYROIDECTOMY, PARTIAL    . TIBIA HARDWARE REMOVAL  2011   rt ankle   . TRIGGER FINGER RELEASE Left 01/12/2013   Procedure: RELEASE TRIGGER FINGER/A-1 PULLEY;  Surgeon: Cammie Sickle., MD;  Location: [place];  Service: Orthopedics;  Laterality: Left;    Current Outpatient Medications  Medication Sig Dispense Refill  . Cyclobenzaprine HCl (FLEXERIL PO) Take 10 mg by mouth as needed (pain).     Marland Kitchen esomeprazole (NEXIUM) 20 MG capsule Take 20 mg by mouth daily at 12 noon.    . flecainide (TAMBOCOR) 50 MG tablet Take 1 tablet (50 mg total) by mouth 2 (two) times daily. 180 tablet 3  . Guselkumab (TREMFYA) 100 MG/ML SOSY Inject 1  Dose into the skin every 8 (eight) weeks.    Marland Kitchen levothyroxine (SYNTHROID, LEVOTHROID) 75 MCG tablet Take 75 mcg by mouth daily before breakfast.    . metoprolol succinate (TOPROL-XL) 25 MG 24 hr tablet Take 1 tablet (25 mg total) by mouth daily. Take with or immediately following a meal. 90 tablet 3  . OVER THE COUNTER MEDICATION Apply 1 application topically daily as needed (knee pain). CBD Cream     No  current facility-administered medications for this visit.    Allergies:   Prednisone, Ciprofloxacin, Dairy aid [lactase], Erythromycin, Nsaids, Other, Sulfa antibiotics, and Wheat bran   Social History: Social History   Socioeconomic History  . Marital status: Married    Spouse name: Not on file  . Number of children: Not on file  . Years of education: Not on file  . Highest education level: Not on file  Occupational History  . Not on file  Tobacco Use  . Smoking status: Former Smoker    Packs/day: 0.25    Years: 0.50    Pack years: 0.12    Types: Cigarettes    Quit date: 07/19/2013    Years since quitting: 6.5  . Smokeless tobacco: Never Used  Substance and Sexual Activity  . Alcohol use: Yes    Comment: occ  . Drug use: No  . Sexual activity: Yes    Comment: has a rare cigarette  Other Topics Concern  . Not on file  Social History Narrative   Lives in [place]   Disabled.  Previously worked as Scientist, water quality at [employer]:   . Difficulty of Paying Living Expenses:   Food Insecurity:   . Worried About Charity fundraiser in the Last Year:   . Arboriculturist in the Last Year:   Transportation Needs:   . Film/video editor (Medical):   Marland Kitchen Lack of Transportation (Non-Medical):   Physical Activity:   . Days of Exercise per Week:   . Minutes of Exercise per Session:   Stress:   . Feeling of Stress :   Social Connections:   . Frequency of Communication with Friends and Family:   . Frequency of Social Gatherings with Friends and Family:   . Attends Religious Services:   . Active Member of Clubs or Organizations:   . Attends Archivist Meetings:   Marland Kitchen Marital Status:   Intimate Partner Violence:   . Fear of Current or Ex-Partner:   . Emotionally Abused:   Marland Kitchen Physically Abused:   . Sexually Abused:     Family History: Family History  Problem Relation Age of Onset  . Diabetes Mother     . Cirrhosis Father   . Alcohol abuse Brother   . Cirrhosis Brother   . Anesthesia problems Neg Hx   . Hypotension Neg Hx   . Malignant hyperthermia Neg Hx   . Pseudochol deficiency Neg Hx     Review of Systems: All other systems reviewed and are otherwise negative except as noted above.   Physical Exam: VS:  BP 126/82   Pulse 60   Ht 5\' 6"  (1.676 m)   Wt 231 lb (104.8 kg)   SpO2 97%   BMI 37.28 kg/m  , BMI Body mass index is 37.28 kg/m. Wt Readings from Last 3 Encounters:  02/02/20 231 lb (104.8 kg)  12/20/19 225 lb (102.1 kg)  12/05/19 229 lb (103.9 kg)  GEN- The patient is well appearing, alert and oriented x 3 today.   HEENT: normocephalic, atraumatic; sclera clear, conjunctiva pink; hearing intact; oropharynx clear; neck supple Lungs- Clear to ausculation bilaterally, normal work of breathing.  No wheezes, rales, rhonchi Heart- Regular rate and rhythm  GI- soft, non-tender, non-distended, bowel sounds present  Extremities- no clubbing, cyanosis, or edema MS- no significant deformity or atrophy Skin- warm and dry, no rash or lesion  Psych- euthymic mood, full affect Neuro- strength and sensation are intact   EKG:  EKG is ordered today. The ekg ordered today shows sinus rhythm, normal intervals   Recent Labs: 12/05/2019: TSH 1.160    Other studies Reviewed: Additional studies/ records that were reviewed today include: Dr Jackalyn Lombard notes  Assessment and Plan:  1.   Atrial tachycardia Significantly improved on flecainide EKG stable today   Current medicines are reviewed at length with the patient today.   The patient does not have concerns regarding her medicines.  The following changes were made today:  none  Labs/ tests ordered today include: none Orders Placed This Encounter  Procedures  . EKG 12-Lead     Disposition:   Follow up with Dr Rayann Heman or me in 3 months      Signed, Chanetta Marshall, NP 02/02/2020 5:24 PM   [place] [address] [place] [address] [phone] (office) [phone] (fax)

## 2020-02-02 NOTE — Patient Instructions (Signed)
Medication Instructions:  *If you need a refill on your cardiac medications before your next appointment, please call your pharmacy*  Lab Work: If you have labs (blood work) drawn today and your tests are completely normal, you will receive your results only by: . MyChart Message (if you have MyChart) OR . A paper copy in the mail If you have any lab test that is abnormal or we need to change your treatment, we will call you to review the results.  Testing/Procedures: None Ordered  Follow-Up: At [facility], you and your health needs are our priority.  As part of our continuing mission to provide you with exceptional heart care, we have created designated Provider Care Teams.  These Care Teams include your primary Cardiologist (physician) and Advanced Practice Providers (APPs -  Physician Assistants and Nurse Practitioners) who all work together to provide you with the care you need, when you need it.  We recommend signing up for the patient portal called "MyChart".  Sign up information is provided on this After Visit Summary.  MyChart is used to connect with patients for Virtual Visits (Telemedicine).  Patients are able to view lab/test results, encounter notes, upcoming appointments, etc.  Non-urgent messages can be sent to your provider as well.   To learn more about what you can do with MyChart, go to [email or web address].    Your next appointment:   Your physician recommends that you schedule a follow-up appointment in: 3 MONTHS with Dr. Allred   The format for your next appointment:   In Person  Provider:   James Allred, MD  

## 2020-05-03 ENCOUNTER — Telehealth: Payer: Self-pay

## 2020-05-03 NOTE — Telephone Encounter (Signed)
Spoke with pt regarding virtual visit on 05/06/20. Pt state she will check her vitals prior to her appt.Pt agreed and confirmed appt.

## 2020-05-06 ENCOUNTER — Telehealth (INDEPENDENT_AMBULATORY_CARE_PROVIDER_SITE_OTHER): Payer: 59 | Admitting: Internal Medicine

## 2020-05-06 ENCOUNTER — Other Ambulatory Visit: Payer: Self-pay

## 2020-05-06 ENCOUNTER — Encounter: Payer: Self-pay | Admitting: Internal Medicine

## 2020-05-06 VITALS — Ht 66.0 in | Wt 220.0 lb

## 2020-05-06 DIAGNOSIS — I471 Supraventricular tachycardia: Secondary | ICD-10-CM

## 2020-05-06 NOTE — Progress Notes (Signed)
Electrophysiology TeleHealth Note   Due to national recommendations of social distancing due to COVID 19, an audio/video telehealth visit is felt to be most appropriate for this patient at this time.  See MyChart message from today for the patient's consent to telehealth for [facility].  Date:  05/06/2020   ID:  Shelly [name], DOB 30-Mar-1961, MRN [id number]  Location: patient's home  Provider location:  [place]  Evaluation Performed: Follow-up visit  PCP:  [name], NP   Electrophysiologist:  Dr Rayann Heman  Chief Complaint:  palpitations  History of Present Illness:    Shelly [name] is a 59 y.o. female who presents via telehealth conferencing today.  Since last being seen in our clinic, the patient reports doing very well. she is please iwht current health state.  Palpitations are minimized. She is pleased with flecainide. Today, she denies symptoms of chest pain, shortness of breath,  lower extremity edema, dizziness, presyncope, or syncope.  The patient is otherwise without complaint today.   Past Medical History:  Diagnosis Date  . Acid reflux   . Adverse effect of general anesthetic    shivering recently-had to have demerol  . Arthritis   . Carpal tunnel syndrome   . Focal nodular hyperplasia of liver surgery in 2004   partial hepactectomy  . GERD (gastroesophageal reflux disease)   . Hypothyroidism   . PONV (postoperative nausea and vomiting)   . SVT (supraventricular tachycardia) ([place]) 03/19/2018   documented with EKG in epic.  . Wears glasses     Past Surgical History:  Procedure Laterality Date  . CARPAL TUNNEL RELEASE     rt  . CARPAL TUNNEL RELEASE Left 01/12/2013   Procedure: CARPAL TUNNEL RELEASE;  Surgeon: Cammie Sickle., MD;  Location: [place];  Service: Orthopedics;  Laterality: Left;  . CARPOMETACARPEL SUSPENSION PLASTY Left 07/24/2014   Procedure: LEFT THUMB CARPOMETACARPEL ([place]) ARTHROPLASTY GARCIA ELIAS ;   Surgeon: Leanora Cover, MD;  Location: [place];  Service: Orthopedics;  Laterality: Left;  . CHOLECYSTECTOMY  2004   liver resection benign tumor with GB  . COLONOSCOPY  03/05/2011   Procedure: COLONOSCOPY;  Surgeon: Rogene Houston, MD;  [facility];  Service: Endoscopy;  Laterality: N/A;  . DORSAL COMPARTMENT RELEASE Left 01/12/2013   Procedure: RELEASE DORSAL COMPARTMENT ([place]);  Surgeon: Cammie Sickle., MD;  Location: [facility];  Service: Orthopedics;  Laterality: Left;  . ESOPHAGOGASTRODUODENOSCOPY  03/05/2011   Procedure: ESOPHAGOGASTRODUODENOSCOPY (EGD);  Surgeon: Rogene Houston, MD;  [facility];  Service: Endoscopy;  Laterality: N/A;  . ESOPHAGOGASTRODUODENOSCOPY N/A 05/14/2016   Procedure: ESOPHAGOGASTRODUODENOSCOPY (EGD);  Surgeon: Rogene Houston, MD;  [facility];  Service: Endoscopy;  Laterality: N/A;  300  . FRACTURE SURGERY  2011   rt ankle  . PARTIAL HYSTERECTOMY    . STERIOD INJECTION Left 01/12/2013   Procedure: INJECTION LEFT THUMB [place] JOINT;  Surgeon: Cammie Sickle., MD;  Location: [place];  Service: Orthopedics;  Laterality: Left;  . SVT ABLATION N/A 05/03/2018   Procedure: SVT ABLATION;  Surgeon: Thompson Grayer, MD;  Location: [place] [facility];  Service: Cardiovascular;  Laterality: N/A;  . THYROIDECTOMY, PARTIAL    . TIBIA HARDWARE REMOVAL  2011   rt ankle   . TRIGGER FINGER RELEASE Left 01/12/2013   Procedure: RELEASE TRIGGER FINGER/A-1 PULLEY;  Surgeon: Cammie Sickle., MD;  Location:  [place];  Service: Orthopedics;  Laterality: Left;    Current Outpatient Medications  Medication Sig Dispense Refill  . Cyclobenzaprine HCl (FLEXERIL PO) Take 10 mg by mouth as needed (pain).     Marland Kitchen esomeprazole (NEXIUM) 20 MG capsule Take 20 mg by mouth daily at 12 noon.    . flecainide (TAMBOCOR) 50 MG tablet Take 1 tablet (50 mg total) by mouth 2 (two) times  daily. 180 tablet 3  . Guselkumab (TREMFYA) 100 MG/ML SOSY Inject 1 Dose into the skin every 8 (eight) weeks.    Marland Kitchen levothyroxine (SYNTHROID, LEVOTHROID) 75 MCG tablet Take 75 mcg by mouth daily before breakfast.    . metoprolol succinate (TOPROL-XL) 25 MG 24 hr tablet Take 1 tablet (25 mg total) by mouth daily. Take with or immediately following a meal. 90 tablet 3  . OVER THE COUNTER MEDICATION Apply 1 application topically daily as needed (knee pain). CBD Cream     No current facility-administered medications for this visit.    Allergies:   Prednisone, Ciprofloxacin, Dairy aid [lactase], Erythromycin, Nsaids, Other, Sulfa antibiotics, and Wheat bran   Social History:  The patient  reports that she quit smoking about 6 years ago. Her smoking use included cigarettes. She has a 0.13 pack-year smoking history. She has never used smokeless tobacco. She reports current alcohol use. She reports that she does not use drugs.   ROS:  Please see the history of present illness.   All other systems are personally reviewed and negative.    Exam:    Vital Signs:  Ht 5\' 6"  (1.676 m)   Wt 220 lb (99.8 kg)   BMI 35.51 kg/m   Well sounding and appearing, alert and conversant, regular work of breathing,  good skin color Eyes- anicteric, neuro- grossly intact, skin- no apparent rash or lesions or cyanosis, mouth- oral mucosa is pink  Labs/Other Tests and Data Reviewed:    Recent Labs: 12/05/2019: TSH 1.160   Wt Readings from Last 3 Encounters:  05/06/20 220 lb (99.8 kg)  02/02/20 231 lb (104.8 kg)  12/20/19 225 lb (102.1 kg)     ASSESSMENT & PLAN:    1.  Atrial tachycardia Doing well with low dose flecainide Pleased with current state.  We may consider weaning flecainide in the future.   Risks, benefits and potential toxicities for medications prescribed and/or refilled reviewed with patient today.   Follow-up:  6 months with EP NP   Patient Risk:  after full review of this patients  clinical status, I feel that they are at moderate risk at this time.  Today, I have spent 15 minutes with the patient with telehealth technology discussing arrhythmia management .    Army Fossa, MD  05/06/2020 10:57 AM     [facility] [address] [place]  [address] [phone] (office) [phone] (fax)

## 2020-05-15 ENCOUNTER — Ambulatory Visit: Payer: 59 | Admitting: Internal Medicine

## 2020-10-23 ENCOUNTER — Encounter: Payer: Self-pay | Admitting: Pharmacist

## 2020-10-23 NOTE — Telephone Encounter (Signed)
Responded to the pt My Chart after this response from the [facility]:  Risk factors for major cardiovascular events: RA patients >61 years old with another cardiovascular risk factor. It does not appear she has another CV risk factor noted. However, risk is increased in patients with smoking history and in the social history section of her chart it notes she quit smoking in 2014 and may have occasional cigarette. I recommend verifying patient's smoking history. Risk of MI and sudden cardiac death are much higher in current and past smokers.   Message text

## 2020-12-16 ENCOUNTER — Other Ambulatory Visit: Payer: Self-pay | Admitting: Internal Medicine

## 2021-01-14 ENCOUNTER — Other Ambulatory Visit: Payer: Self-pay | Admitting: Internal Medicine

## 2021-02-11 ENCOUNTER — Encounter (INDEPENDENT_AMBULATORY_CARE_PROVIDER_SITE_OTHER): Payer: Self-pay | Admitting: *Deleted

## 2021-03-21 ENCOUNTER — Other Ambulatory Visit: Payer: Self-pay | Admitting: Internal Medicine

## 2021-04-14 ENCOUNTER — Telehealth (INDEPENDENT_AMBULATORY_CARE_PROVIDER_SITE_OTHER): Payer: Self-pay

## 2021-04-14 ENCOUNTER — Encounter (INDEPENDENT_AMBULATORY_CARE_PROVIDER_SITE_OTHER): Payer: Self-pay

## 2021-04-14 ENCOUNTER — Other Ambulatory Visit (INDEPENDENT_AMBULATORY_CARE_PROVIDER_SITE_OTHER): Payer: Self-pay

## 2021-04-14 DIAGNOSIS — Z1211 Encounter for screening for malignant neoplasm of colon: Secondary | ICD-10-CM

## 2021-04-14 MED ORDER — CLENPIQ 10-3.5-12 MG-GM -GM/160ML PO SOLN: 1.0000 | Freq: Once | ORAL | 0 refills | Status: AC

## 2021-04-14 NOTE — Telephone Encounter (Signed)
Referring MD/PCP: MARSHA WHITE  Procedure: TCS  Reason/Indication:  SCREENING  Has patient had this procedure before?  YES  If so, when, by whom and where? 2012   Is there a family history of colon cancer?  NO  Who?  What age when diagnosed?    Is patient diabetic? If yes, Type 1 or Type 2   PRE DIABETIC      Does patient have prosthetic heart valve or mechanical valve?  NO  Do you have a pacemaker/defibrillator?  NO  Has patient ever had endocarditis/atrial fibrillation? NO  Does patient use oxygen? NO  Has patient had joint replacement within last 12 months?  NO  Is patient constipated or do they take laxatives? NO  Does patient have a history of alcohol/drug use?  NO  Have you had a stroke/heart attack last 6 mths? NO  Do you take medicine for weight loss?  NO  For female patients,: do you still have your menstrual cycle? NO  Is patient on blood thinner such as Coumadin, Plavix and/or Aspirin? NO  Medications: Nexium 20 mg daily, Lexapro 10 mg daily, Flecainide 50 mg bid, Metformin 500 mg daily, Levothyroxine 75 mg daily  Allergies: NSAIDS, Sulfa, Prednisone  Medication Adjustment per Dr Laural Golden No Metformin the evening prior or the morning of your procedure  Procedure date & time: 04/30/21 at 12:15

## 2021-04-14 NOTE — Telephone Encounter (Signed)
Shelly [name], CMA  

## 2021-04-15 ENCOUNTER — Other Ambulatory Visit: Payer: Self-pay | Admitting: Internal Medicine

## 2021-04-15 ENCOUNTER — Encounter (INDEPENDENT_AMBULATORY_CARE_PROVIDER_SITE_OTHER): Payer: Self-pay

## 2021-04-17 ENCOUNTER — Other Ambulatory Visit: Payer: Self-pay

## 2021-04-17 ENCOUNTER — Telehealth: Payer: Self-pay | Admitting: Internal Medicine

## 2021-04-17 MED ORDER — FLECAINIDE ACETATE 50 MG PO TABS: 50.0000 mg | ORAL_TABLET | Freq: Two times a day (BID) | ORAL | 0 refills | Status: DC

## 2021-04-17 NOTE — Telephone Encounter (Signed)
*  STAT* If patient is at the pharmacy, call can be transferred to refill team.   1. Which medications need to be refilled? (please list name of each medication and dose if known)  flecainide (TAMBOCOR) 50 MG tablet   2. Which pharmacy/location (including street and city if local pharmacy) is medication to be sent to? [place], [place]  3. Do they need a 30 day or 90 day supply?  90   Patient is scheduled to see Dr. Rayann Heman 05/21/21

## 2021-04-17 NOTE — Telephone Encounter (Signed)
Yes she needs EGD given history of Barrett's esophagus

## 2021-04-22 ENCOUNTER — Other Ambulatory Visit (INDEPENDENT_AMBULATORY_CARE_PROVIDER_SITE_OTHER): Payer: Self-pay

## 2021-04-22 DIAGNOSIS — K2270 Barrett's esophagus without dysplasia: Secondary | ICD-10-CM

## 2021-04-24 NOTE — Patient Instructions (Signed)
Shelly [name]  04/24/2021     '@PREFPERIOPPHARMACY'$ @   Your procedure is scheduled on  04/30/2021.   Report to [facility] at  1200  P.M.   Call this number if you have problems the morning of your procedure [phone].     Follow the diet and prep instructions given to you by the office.    DO NOT take any medications for diabetes the morning of your procedure.    Take these medicines the morning of surgery with A SIP OF WATER     lexapro, nexium, flecanide, levothyroxine, metoprolol.     Do not wear jewelry, make-up or nail polish.  Do not wear lotions, powders, or perfumes, or deodorant.  Do not shave 48 hours prior to surgery.  Men may shave face and neck.  Do not bring valuables to the hospital.  [facility] is not responsible for any belongings or valuables.  Contacts, dentures or bridgework may not be worn into surgery.  Leave your suitcase in the car.  After surgery it may be brought to your room.  For patients admitted to the hospital, discharge time will be determined by your treatment team.  Patients discharged the day of surgery will not be allowed to drive home and must have someone with them for 24 hours.    Special instructions:   DO NOT smoke tobacco or vape for 24 hours before your procedure.  Please read over the following fact sheets that you were given. Anesthesia Post-op Instructions and Care and Recovery After Surgery      Upper Endoscopy, Adult, Care After This sheet gives you information about how to care for yourself after your procedure. Your health care provider may also give you more specific instructions. If you have problems or questions, contact your health care provider. What can I expect after the procedure? After the procedure, it is common to have: A sore throat. Mild stomach pain or discomfort. Bloating. Nausea. Follow these instructions at home:  Follow instructions from your health care provider about what to eat or  drink after your procedure. Return to your normal activities as told by your health care provider. Ask your health care provider what activities are safe for you. Take over-the-counter and prescription medicines only as told by your health care provider. If you were given a sedative during the procedure, it can affect you for several hours. Do not drive or operate machinery until your health care provider says that it is safe. Keep all follow-up visits as told by your health care provider. This is important. Contact a health care provider if you have: A sore throat that lasts longer than one day. Trouble swallowing. Get help right away if: You vomit blood or your vomit looks like coffee grounds. You have: A fever. Bloody, black, or tarry stools. A severe sore throat or you cannot swallow. Difficulty breathing. Severe pain in your chest or abdomen. Summary After the procedure, it is common to have a sore throat, mild stomach discomfort, bloating, and nausea. If you were given a sedative during the procedure, it can affect you for several hours. Do not drive or operate machinery until your health care provider says that it is safe. Follow instructions from your health care provider about what to eat or drink after your procedure. Return to your normal activities as told by your health care provider. This information is not intended to replace advice given to you by your health care  provider. Make sure you discuss any questions you have with your health care provider. Document Revised: 08/01/2019 Document Reviewed: 01/03/2018 Elsevier Patient Education  2022 [place]. Colonoscopy, Adult, Care After This sheet gives you information about how to care for yourself after your procedure. Your health care provider may also give you more specific instructions. If you have problems or questions, contact your health care provider. What can I expect after the procedure? After the procedure, it is  common to have: A small amount of blood in your stool for 24 hours after the procedure. Some gas. Mild cramping or bloating of your abdomen. Follow these instructions at home: Eating and drinking  Drink enough fluid to keep your urine pale yellow. Follow instructions from your health care provider about eating or drinking restrictions. Resume your normal diet as instructed by your health care provider. Avoid heavy or fried foods that are hard to digest. Activity Rest as told by your health care provider. Avoid sitting for a long time without moving. Get up to take short walks every 1-2 hours. This is important to improve blood flow and breathing. Ask for help if you feel weak or unsteady. Return to your normal activities as told by your health care provider. Ask your health care provider what activities are safe for you. Managing cramping and bloating  Try walking around when you have cramps or feel bloated. Apply heat to your abdomen as told by your health care provider. Use the heat source that your health care provider recommends, such as a moist heat pack or a heating pad. Place a towel between your skin and the heat source. Leave the heat on for 20-30 minutes. Remove the heat if your skin turns bright red. This is especially important if you are unable to feel pain, heat, or cold. You may have a greater risk of getting burned. General instructions If you were given a sedative during the procedure, it can affect you for several hours. Do not drive or operate machinery until your health care provider says that it is safe. For the first 24 hours after the procedure: Do not sign important documents. Do not drink alcohol. Do your regular daily activities at a slower pace than normal. Eat soft foods that are easy to digest. Take over-the-counter and prescription medicines only as told by your health care provider. Keep all follow-up visits as told by your health care provider. This is  important. Contact a health care provider if: You have blood in your stool 2-3 days after the procedure. Get help right away if you have: More than a small spotting of blood in your stool. Large blood clots in your stool. Swelling of your abdomen. Nausea or vomiting. A fever. Increasing pain in your abdomen that is not relieved with medicine. Summary After the procedure, it is common to have a small amount of blood in your stool. You may also have mild cramping and bloating of your abdomen. If you were given a sedative during the procedure, it can affect you for several hours. Do not drive or operate machinery until your health care provider says that it is safe. Get help right away if you have a lot of blood in your stool, nausea or vomiting, a fever, or increased pain in your abdomen. This information is not intended to replace advice given to you by your health care provider. Make sure you discuss any questions you have with your health care provider. Document Revised: 07/28/2019 Document Reviewed: 02/27/2019 Elsevier Patient  Education  2022 [place] After This sheet gives you information about how to care for yourself after your procedure. Your health care provider may also give you more specific instructions. If you have problems or questions, contact your health care provider. What can I expect after the procedure? After the procedure, it is common to have: Tiredness. Forgetfulness about what happened after the procedure. Impaired judgment for important decisions. Nausea or vomiting. Some difficulty with balance. Follow these instructions at home: For the time period you were told by your health care provider:   Rest as needed. Do not participate in activities where you could fall or become injured. Do not drive or use machinery. Do not drink alcohol. Do not take sleeping pills or medicines that cause drowsiness. Do not make important  decisions or sign legal documents. Do not take care of children on your own. Eating and drinking Follow the diet that is recommended by your health care provider. Drink enough fluid to keep your urine pale yellow. If you vomit: Drink water, juice, or soup when you can drink without vomiting. Make sure you have little or no nausea before eating solid foods. General instructions Have a responsible adult stay with you for the time you are told. It is important to have someone help care for you until you are awake and alert. Take over-the-counter and prescription medicines only as told by your health care provider. If you have sleep apnea, surgery and certain medicines can increase your risk for breathing problems. Follow instructions from your health care provider about wearing your sleep device: Anytime you are sleeping, including during daytime naps. While taking prescription pain medicines, sleeping medicines, or medicines that make you drowsy. Avoid smoking. Keep all follow-up visits as told by your health care provider. This is important. Contact a health care provider if: You keep feeling nauseous or you keep vomiting. You feel light-headed. You are still sleepy or having trouble with balance after 24 hours. You develop a rash. You have a fever. You have redness or swelling around the IV site. Get help right away if: You have trouble breathing. You have new-onset confusion at home. Summary For several hours after your procedure, you may feel tired. You may also be forgetful and have poor judgment. Have a responsible adult stay with you for the time you are told. It is important to have someone help care for you until you are awake and alert. Rest as told. Do not drive or operate machinery. Do not drink alcohol or take sleeping pills. Get help right away if you have trouble breathing, or if you suddenly become confused. This information is not intended to replace advice given to you  by your health care provider. Make sure you discuss any questions you have with your health care provider. Document Revised: 04/18/2020 Document Reviewed: 07/06/2019 Elsevier Patient Education  2022 [employer].

## 2021-04-28 ENCOUNTER — Other Ambulatory Visit: Payer: Self-pay

## 2021-04-28 ENCOUNTER — Encounter (HOSPITAL_COMMUNITY)
Admission: RE | Admit: 2021-04-28 | Discharge: 2021-04-28 | Disposition: A | Discharge status: Home / Self Care | Payer: Medicare HMO | Source: Ambulatory Visit | Attending: Internal Medicine | Admitting: Internal Medicine

## 2021-04-28 ENCOUNTER — Encounter (HOSPITAL_COMMUNITY): Payer: Self-pay

## 2021-04-28 ENCOUNTER — Other Ambulatory Visit (INDEPENDENT_AMBULATORY_CARE_PROVIDER_SITE_OTHER): Payer: Self-pay

## 2021-04-28 DIAGNOSIS — Z1211 Encounter for screening for malignant neoplasm of colon: Secondary | ICD-10-CM | POA: Diagnosis not present

## 2021-04-28 DIAGNOSIS — Z01818 Encounter for other preprocedural examination: Secondary | ICD-10-CM | POA: Insufficient documentation

## 2021-04-28 DIAGNOSIS — K2270 Barrett's esophagus without dysplasia: Secondary | ICD-10-CM

## 2021-04-28 HISTORY — DX: Prediabetes: R73.03

## 2021-04-28 HISTORY — DX: Villonodular synovitis (pigmented), unspecified site: M12.20

## 2021-04-28 LAB — BASIC METABOLIC PANEL
Anion gap: 8 (ref 5–15)
BUN: 10 mg/dL (ref 8–23)
CO2: 28 mmol/L (ref 22–32)
Calcium: 9 mg/dL (ref 8.9–10.3)
Chloride: 101 mmol/L (ref 98–111)
Creatinine, Ser: 0.75 mg/dL (ref 0.44–1.00)
GFR, Estimated: >60 mL/min (ref >60)
Glucose, Bld: 149 mg/dL — ABNORMAL HIGH (ref 70–99)
Potassium: 4.2 mmol/L (ref 3.5–5.1)
Sodium: 137 mmol/L (ref 135–145)

## 2021-04-30 ENCOUNTER — Ambulatory Visit (HOSPITAL_COMMUNITY): Payer: Medicare HMO | Admitting: Anesthesiology

## 2021-04-30 ENCOUNTER — Other Ambulatory Visit: Payer: Self-pay

## 2021-04-30 ENCOUNTER — Encounter (HOSPITAL_COMMUNITY): Payer: Self-pay | Admitting: Internal Medicine

## 2021-04-30 ENCOUNTER — Encounter (HOSPITAL_COMMUNITY)
Admission: RE | Disposition: A | Discharge status: Home / Self Care | Payer: Self-pay | Source: Home / Self Care | Attending: Internal Medicine

## 2021-04-30 ENCOUNTER — Ambulatory Visit (HOSPITAL_COMMUNITY)
Admission: RE | Admit: 2021-04-30 | Discharge: 2021-04-30 | Disposition: A | Discharge status: Home / Self Care | Payer: Medicare HMO | Attending: Internal Medicine | Admitting: Internal Medicine

## 2021-04-30 DIAGNOSIS — K2270 Barrett's esophagus without dysplasia: Secondary | ICD-10-CM

## 2021-04-30 DIAGNOSIS — D175 Benign lipomatous neoplasm of intra-abdominal organs: Secondary | ICD-10-CM | POA: Insufficient documentation

## 2021-04-30 DIAGNOSIS — K449 Diaphragmatic hernia without obstruction or gangrene: Secondary | ICD-10-CM | POA: Diagnosis not present

## 2021-04-30 DIAGNOSIS — K317 Polyp of stomach and duodenum: Secondary | ICD-10-CM | POA: Diagnosis not present

## 2021-04-30 DIAGNOSIS — Z881 Allergy status to other antibiotic agents status: Secondary | ICD-10-CM | POA: Diagnosis not present

## 2021-04-30 DIAGNOSIS — E039 Hypothyroidism, unspecified: Secondary | ICD-10-CM | POA: Diagnosis not present

## 2021-04-30 DIAGNOSIS — D123 Benign neoplasm of transverse colon: Secondary | ICD-10-CM | POA: Diagnosis not present

## 2021-04-30 DIAGNOSIS — Z87891 Personal history of nicotine dependence: Secondary | ICD-10-CM | POA: Insufficient documentation

## 2021-04-30 DIAGNOSIS — Z886 Allergy status to analgesic agent status: Secondary | ICD-10-CM | POA: Diagnosis not present

## 2021-04-30 DIAGNOSIS — R1314 Dysphagia, pharyngoesophageal phase: Secondary | ICD-10-CM | POA: Insufficient documentation

## 2021-04-30 DIAGNOSIS — Z882 Allergy status to sulfonamides status: Secondary | ICD-10-CM | POA: Diagnosis not present

## 2021-04-30 DIAGNOSIS — K219 Gastro-esophageal reflux disease without esophagitis: Secondary | ICD-10-CM | POA: Diagnosis not present

## 2021-04-30 DIAGNOSIS — Z888 Allergy status to other drugs, medicaments and biological substances status: Secondary | ICD-10-CM | POA: Insufficient documentation

## 2021-04-30 DIAGNOSIS — K644 Residual hemorrhoidal skin tags: Secondary | ICD-10-CM | POA: Diagnosis not present

## 2021-04-30 DIAGNOSIS — Z79890 Hormone replacement therapy (postmenopausal): Secondary | ICD-10-CM | POA: Insufficient documentation

## 2021-04-30 DIAGNOSIS — Z7984 Long term (current) use of oral hypoglycemic drugs: Secondary | ICD-10-CM | POA: Insufficient documentation

## 2021-04-30 DIAGNOSIS — Z8719 Personal history of other diseases of the digestive system: Secondary | ICD-10-CM | POA: Insufficient documentation

## 2021-04-30 DIAGNOSIS — Z79899 Other long term (current) drug therapy: Secondary | ICD-10-CM | POA: Diagnosis not present

## 2021-04-30 DIAGNOSIS — Z1211 Encounter for screening for malignant neoplasm of colon: Secondary | ICD-10-CM

## 2021-04-30 DIAGNOSIS — R7303 Prediabetes: Secondary | ICD-10-CM | POA: Insufficient documentation

## 2021-04-30 DIAGNOSIS — K648 Other hemorrhoids: Secondary | ICD-10-CM | POA: Diagnosis not present

## 2021-04-30 DIAGNOSIS — K6289 Other specified diseases of anus and rectum: Secondary | ICD-10-CM

## 2021-04-30 HISTORY — PX: SUBMUCOSAL TATTOO INJECTION: SHX6856

## 2021-04-30 HISTORY — PX: BIOPSY: SHX5522

## 2021-04-30 HISTORY — PX: HOT HEMOSTASIS: SHX5433

## 2021-04-30 HISTORY — PX: COLONOSCOPY WITH PROPOFOL: SHX5780

## 2021-04-30 HISTORY — PX: POLYPECTOMY: SHX149

## 2021-04-30 HISTORY — PX: ESOPHAGOGASTRODUODENOSCOPY (EGD) WITH PROPOFOL: SHX5813

## 2021-04-30 LAB — GLUCOSE, CAPILLARY: Glucose-Capillary: 133 mg/dL — ABNORMAL HIGH (ref 70–99)

## 2021-04-30 LAB — HM COLONOSCOPY

## 2021-04-30 SURGERY — COLONOSCOPY WITH PROPOFOL
Anesthesia: General

## 2021-04-30 MED ORDER — SPOT INK MARKER SYRINGE KIT
PACK | SUBMUCOSAL | Status: DC | PRN
  Administered 2021-04-30: 3 mL via SUBMUCOSAL

## 2021-04-30 MED ORDER — PROPOFOL 10 MG/ML IV BOLUS
INTRAVENOUS | Status: AC
  Filled 2021-04-30: qty 20

## 2021-04-30 MED ORDER — LACTATED RINGERS IV SOLN: INTRAVENOUS | Status: DC

## 2021-04-30 MED ORDER — SODIUM CHLORIDE FLUSH 0.9 % IV SOLN
INTRAVENOUS | Status: AC
  Filled 2021-04-30: qty 10

## 2021-04-30 MED ORDER — STERILE WATER FOR IRRIGATION IR SOLN
Status: DC | PRN
  Administered 2021-04-30: 100 mL
  Administered 2021-04-30: 200 mL

## 2021-04-30 MED ORDER — PROPOFOL 500 MG/50ML IV EMUL
INTRAVENOUS | Status: DC | PRN
  Administered 2021-04-30: 150 ug/kg/min via INTRAVENOUS

## 2021-04-30 MED ORDER — PROPOFOL 10 MG/ML IV BOLUS
INTRAVENOUS | Status: DC | PRN
  Administered 2021-04-30: 50 mg via INTRAVENOUS
  Administered 2021-04-30: 100 mg via INTRAVENOUS
  Administered 2021-04-30 (×2): 50 mg via INTRAVENOUS

## 2021-04-30 MED ORDER — LIDOCAINE HCL (CARDIAC) PF 100 MG/5ML IV SOSY
PREFILLED_SYRINGE | INTRAVENOUS | Status: DC | PRN
  Administered 2021-04-30: 50 mg via INTRAVENOUS

## 2021-04-30 NOTE — Anesthesia Preprocedure Evaluation (Addendum)
Anesthesia Evaluation  Patient identified by MRN, date of birth, ID band Patient awake    Reviewed: Allergy & Precautions, NPO status , Patient's Chart, lab work & pertinent test results  History of Anesthesia Complications (+) PONV and history of anesthetic complications  Airway Mallampati: III  TM Distance: >3 FB Neck ROM: Full    Dental  (+) Dental Advisory Given Cracked tooth:   Pulmonary former smoker,    Pulmonary exam normal breath sounds clear to auscultation       Cardiovascular Exercise Tolerance: Good + dysrhythmias Supra Ventricular Tachycardia  Rhythm:Regular Rate:Bradycardia     Neuro/Psych  Neuromuscular disease negative psych ROS   GI/Hepatic Neg liver ROS, GERD  Medicated and Controlled,  Endo/Other  diabetes, Well Controlled, Type 2, Oral Hypoglycemic AgentsHypothyroidism   Renal/GU negative Renal ROS     Musculoskeletal  (+) Arthritis  (psoriasis), Fibromyalgia -  Abdominal   Peds  Hematology   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)  Reproductive/Obstetrics                          Anesthesia Physical Anesthesia Plan  ASA: 3  Anesthesia Plan: General   Post-op Pain Management:    Induction: Intravenous  PONV Risk Score and Plan: TIVA  Airway Management Planned: Natural Airway, Nasal Cannula and Simple Face Mask  Additional Equipment:   Intra-op Plan:   Post-operative Plan:   Informed Consent: I have reviewed the patients History and Physical, chart, labs and discussed the procedure including the risks, benefits and alternatives for the proposed anesthesia with the patient or authorized representative who has indicated his/her understanding and acceptance.     Dental advisory given  Plan Discussed with: CRNA and Surgeon  Anesthesia Plan Comments:       Anesthesia Quick Evaluation

## 2021-04-30 NOTE — Transfer of Care (Signed)
Immediate Anesthesia Transfer of Care Note  Patient: Shelly [name]  Procedure(s) Performed: COLONOSCOPY WITH PROPOFOL ESOPHAGOGASTRODUODENOSCOPY (EGD) WITH PROPOFOL BIOPSY POLYPECTOMY INTESTINAL SUBMUCOSAL TATTOO INJECTION HOT HEMOSTASIS (ARGON PLASMA COAGULATION/BICAP)  Patient Location: Short Stay  Anesthesia Type:General  Level of Consciousness: awake, alert  and patient cooperative  Airway & Oxygen Therapy: Patient Spontanous Breathing  Post-op Assessment: Report given to RN and Post -op Vital signs reviewed and stable  Post vital signs: Reviewed and stable  Last Vitals:  Vitals Value Taken Time  BP    Temp 98.3   Pulse    Resp    SpO2      Last Pain:  Vitals:   04/30/21 1357  TempSrc:   PainSc: 0-No pain         Complications: No notable events documented.

## 2021-04-30 NOTE — Anesthesia Procedure Notes (Addendum)
Date/Time: 04/30/2021 2:13 PM Performed by: Orlie Dakin, CRNA Pre-anesthesia Checklist: Patient identified, Emergency Drugs available, Suction available and Patient being monitored Patient Re-evaluated:Patient Re-evaluated prior to induction Oxygen Delivery Method: Nasal cannula Induction Type: IV induction Placement Confirmation: positive ETCO2

## 2021-04-30 NOTE — Discharge Instructions (Addendum)
No aspirin or NSAIDs for 5 days. Resume usual medications as before. Mechanical soft diet for 24 hours and thereafter usual diet. No driving for 24 hours. Physician will call with biopsy results.

## 2021-04-30 NOTE — Anesthesia Postprocedure Evaluation (Signed)
Anesthesia Post Note  Patient: Shelly [name]  Procedure(s) Performed: COLONOSCOPY WITH PROPOFOL ESOPHAGOGASTRODUODENOSCOPY (EGD) WITH PROPOFOL BIOPSY POLYPECTOMY INTESTINAL SUBMUCOSAL TATTOO INJECTION HOT HEMOSTASIS (ARGON PLASMA COAGULATION/BICAP)  Patient location during evaluation: Phase II Anesthesia Type: General Level of consciousness: awake and alert and oriented Pain management: pain level controlled Vital Signs Assessment: post-procedure vital signs reviewed and stable Respiratory status: spontaneous breathing and respiratory function stable Cardiovascular status: stable Postop Assessment: no apparent nausea or vomiting Anesthetic complications: no   No notable events documented.   Last Vitals:  Vitals:   04/30/21 1231 04/30/21 1505  BP: 126/79 110/60  Pulse: (!) 59 62  Resp: 18 16  Temp: 37.2 C 36.8 C  SpO2: 95% 99%    Last Pain:  Vitals:   04/30/21 1505  TempSrc: Oral  PainSc: 5                  Jessamyn Watterson C Jaydan Chretien

## 2021-04-30 NOTE — Op Note (Signed)
[facility] Patient Name: Shelly [name] Procedure Date: 04/30/2021 2:18 PM MRN: [id number] Date of Birth: 1961-08-08 Attending MD: Hildred Laser , MD CSN: [id number] Age: 60 Admit Type: Outpatient Procedure:                Colonoscopy Indications:              Screening for colorectal malignant neoplasm Providers:                Hildred Laser, MD, Crystal Page, Nelma Rothman,                            Technician Referring MD:             Delmar Landau. White, NP Medicines:                Propofol per Anesthesia Complications:            No immediate complications. Estimated Blood Loss:     Estimated blood loss was minimal. Procedure:                Pre-Anesthesia Assessment:                           - Prior to the procedure, a History and Physical                            was performed, and patient medications and                            allergies were reviewed. The patient's tolerance of                            previous anesthesia was also reviewed. The risks                            and benefits of the procedure and the sedation                            options and risks were discussed with the patient.                            All questions were answered, and informed consent                            was obtained. Prior Anticoagulants: The patient has                            taken no previous anticoagulant or antiplatelet                            agents. ASA Grade Assessment: III - A patient with                            severe systemic disease. After reviewing the risks  and benefits, the patient was deemed in                            satisfactory condition to undergo the procedure.                           After obtaining informed consent, the colonoscope                            was passed under direct vision. Throughout the                            procedure, the patient's blood pressure, pulse, and                             oxygen saturations were monitored continuously. The                            [id number]-[id number] scope was introduced through                            the anus and advanced to the the cecum, identified                            by appendiceal orifice and ileocecal valve. The                            colonoscopy was performed without difficulty. The                            patient tolerated the procedure well. The quality                            of the bowel preparation was good. The ileocecal                            valve, appendiceal orifice, and rectum were                            photographed. Scope In: 2:20:36 PM Scope Out: 2:55:21 PM Scope Withdrawal Time: 0 hours 30 minutes 23 seconds  Total Procedure Duration: 0 hours 34 minutes 45 seconds  Findings:      Skin tags were found on perianal exam.      There was a small lipoma, in the distal transverse colon.      A 10 to 30 mm polyp was found in the distal transverse colon. The polyp       was flat. Area was successfully injected with 7 mL Eleview for lesion       assessment, and this injection appeared to lift the lesion adequately.       The polyp was removed with a hot snare. Polyp resection was incomplete.       The resected tissue was retrieved. Coagulation for tissue destruction       using argon plasma was successful. The pathology specimen was placed  into Bottle Number 2. Area was tattooed with an injection of 3 mL of       [place] ink.      External hemorrhoids were found during retroflexion. The hemorrhoids       were small.      small anal papilla. Impression:               - Perianal skin tags found on perianal exam.                           - Small lipoma in the distal transverse colon.                           - One 10 to 30 mm polyp in the distal transverse                            colon, removed with a hot snare. Incomplete                            resection. Resected tissue retrieved.  Injected.                            Treated with argon plasma coagulation (APC).                            Tattooed.                           - External hemorrhoids.                           - Single small anal papilla. Moderate Sedation:      Per Anesthesia Care Recommendation:           - Patient has a contact number available for                            emergencies. The signs and symptoms of potential                            delayed complications were discussed with the                            patient. Return to normal activities tomorrow.                            Written discharge instructions were provided to the                            patient.                           - Continue present medications.                           - No aspirin, ibuprofen, naproxen, or other  non-steroidal anti-inflammatory drugs for 5 days.                           - Await pathology results.                           - See the other procedure note for documentation of                            additional recommendations.                           - Repeat colonoscopy for surveillance based on                            pathology results. Procedure Code(s):        --- Professional ---                           [phone], Colonoscopy, flexible; with ablation of                            tumor(s), polyp(s), or other lesion(s) (includes                            pre- and post-dilation and guide wire passage, when                            performed)                           45381, Colonoscopy, flexible; with directed                            submucosal injection(s), any substance Diagnosis Code(s):        --- Professional ---                           K64.4, Residual hemorrhoidal skin tags                           Z12.11, Encounter for screening for malignant                            neoplasm of colon                           D17.5, Benign lipomatous  neoplasm of                            intra-abdominal organs                           K63.5, Polyp of colon CPT copyright 2019 American Medical Association. All rights reserved. The codes documented in this report are preliminary and upon coder review may  be revised to meet current compliance requirements. Hildred Laser, MD Hildred Laser, MD 04/30/2021 3:16:57 PM This  report has been signed electronically. Number of Addenda: 0

## 2021-04-30 NOTE — Op Note (Signed)
[facility] Patient Name: Shelly [name] Procedure Date: 04/30/2021 1:55 PM MRN: [id number] Date of Birth: 09-Jul-1961 Attending MD: Hildred Laser , MD CSN: [id number] Age: 60 Admit Type: Outpatient Procedure:                Upper GI endoscopy Indications:              Esophageal dysphagia, Barrett's esophagus,                            Follow-up of Barrett's esophagus Providers:                Hildred Laser, MD, Crystal Page, Nelma Rothman,                            Technician Referring MD:             Delmar Landau. White, NP Medicines:                Propofol per Anesthesia Complications:            No immediate complications. Estimated Blood Loss:     Estimated blood loss was minimal. Procedure:                Pre-Anesthesia Assessment:                           - Prior to the procedure, a History and Physical                            was performed, and patient medications and                            allergies were reviewed. The patient's tolerance of                            previous anesthesia was also reviewed. The risks                            and benefits of the procedure and the sedation                            options and risks were discussed with the patient.                            All questions were answered, and informed consent                            was obtained. Prior Anticoagulants: The patient has                            taken no previous anticoagulant or antiplatelet                            agents. ASA Grade Assessment: III - A patient with  severe systemic disease. After reviewing the risks                            and benefits, the patient was deemed in                            satisfactory condition to undergo the procedure.                           After obtaining informed consent, the endoscope was                            passed under direct vision. Throughout the                            procedure,  the patient's blood pressure, pulse, and                            oxygen saturations were monitored continuously. The                            [id number] scope was introduced through the                            mouth, and advanced to the second part of duodenum.                            The upper GI endoscopy was accomplished without                            difficulty. The patient tolerated the procedure                            well. Scope In: 2:02:33 PM Scope Out: 2:16:30 PM Total Procedure Duration: 0 hours 13 minutes 57 seconds  Findings:      The hypopharynx was normal.      The examined esophagus was normal.      The Z-line was regular and was found 38 cm from the incisors.      A 3 cm hiatal hernia was present.      No endoscopic abnormality was evident in the esophagus to explain the       patient's complaint of dysphagia. It was decided, however, to proceed       with dilation of the entire esophagus. The dilation site was examined       following endoscope reinsertion and showed mild mucosal disruption and       no perforation.      A few small sessile polyps with no stigmata of recent bleeding were       found in the gastric fundus and in the gastric body. Biopsies were taken       with a cold forceps for histology. The pathology specimen was placed       into Bottle Number 1.      The exam of the stomach was otherwise normal.      The duodenal bulb and second portion of  the duodenum were normal. Impression:               - Normal hypopharynx.                           - Normal esophagus.                           - Z-line regular, 38 cm from the incisors.                           - 3 cm hiatal hernia.                           - No endoscopic esophageal abnormality to explain                            patient's dysphagia. Esophagus dilated. Mucosal                            disruption noted suggested of sublte narrowing or a                             web.                           - A few gastric polyps. Biopsied.                           - Normal duodenal bulb and second portion of the                            duodenum.                           comment; no barretts mucosa noted because of prior                            biopsies. Moderate Sedation:      Per Anesthesia Care Recommendation:           - Patient has a contact number available for                            emergencies. The signs and symptoms of potential                            delayed complications were discussed with the                            patient. Return to normal activities tomorrow.                            Written discharge instructions were provided to the                            patient.                           -  Mechanical soft diet today.                           - Continue present medications.                           - See the other procedure note for documentation of                            additional recommendations. Procedure Code(s):        --- Professional ---                           [phone], Esophagogastroduodenoscopy, flexible,                            transoral; with biopsy, single or multiple Diagnosis Code(s):        --- Professional ---                           K22.70, Barrett's esophagus without dysplasia                           K44.9, Diaphragmatic hernia without obstruction or                            gangrene                           K31.7, Polyp of stomach and duodenum                           R13.14, Dysphagia, pharyngoesophageal phase CPT copyright 2019 American Medical Association. All rights reserved. The codes documented in this report are preliminary and upon coder review may  be revised to meet current compliance requirements. Hildred Laser, MD Hildred Laser, MD 04/30/2021 3:07:57 PM This report has been signed electronically. Number of Addenda: 0

## 2021-04-30 NOTE — H&P (Addendum)
Shelly [name] is an 60 y.o. female.   Chief Complaint: Patient is here for esophagogastroduodenoscopy with esophageal dilation and colonoscopy. HPI: Patient is 60 year old Caucasian female who has chronic GERD complicated by short segment Barrett's esophagus who is here for surveillance EGD.  She also complains of intermittent dysphagia to solids.  She points to upper sternal area as site of bolus obstruction.  She says heartburn is well controlled.  She never eats too much and does not eat late.  She is also undergoing screening colonoscopy.  Last exam was in 2012 with removal of 2 polyps these are hyperplastic.  Her bowels move regularly.  She denies melena or rectal bleeding. Family history is negative for CRC.  Past Medical History:  Diagnosis Date   Acid reflux    Adverse effect of general anesthetic    shivering recently-had to have demerol   Arthritis    Carpal tunnel syndrome    Focal nodular hyperplasia of liver surgery in 2004   partial hepactectomy   GERD (gastroesophageal reflux disease)    Hypothyroidism    PONV (postoperative nausea and vomiting)    Pre-diabetes    PVNS (pigmented villonodular synovitis)    SVT (supraventricular tachycardia) ([place]) 03/19/2018   documented with EKG in epic.   Wears glasses     Past Surgical History:  Procedure Laterality Date   CARPAL TUNNEL RELEASE     rt   CARPAL TUNNEL RELEASE Left 01/12/2013   Procedure: CARPAL TUNNEL RELEASE;  Surgeon: Cammie Sickle., MD;  Location: [place];  Service: Orthopedics;  Laterality: Left;   CARPOMETACARPEL SUSPENSION PLASTY Left 07/24/2014   Procedure: LEFT THUMB CARPOMETACARPEL ([place]) ARTHROPLASTY GARCIA ELIAS ;  Surgeon: Leanora Cover, MD;  Location: [place];  Service: Orthopedics;  Laterality: Left;   CHOLECYSTECTOMY  2004   liver resection benign tumor with GB   COLONOSCOPY  03/05/2011   Procedure: COLONOSCOPY;  Surgeon: Rogene Houston, MD;  [facility];  Service: Endoscopy;  Laterality: N/A;   DORSAL COMPARTMENT RELEASE Left 01/12/2013   Procedure: RELEASE DORSAL COMPARTMENT ([place]);  Surgeon: Cammie Sickle., MD;  Location: [facility];  Service: Orthopedics;  Laterality: Left;   ESOPHAGOGASTRODUODENOSCOPY  03/05/2011   Procedure: ESOPHAGOGASTRODUODENOSCOPY (EGD);  Surgeon: Rogene Houston, MD;  [facility];  Service: Endoscopy;  Laterality: N/A;   ESOPHAGOGASTRODUODENOSCOPY N/A 05/14/2016   Procedure: ESOPHAGOGASTRODUODENOSCOPY (EGD);  Surgeon: Rogene Houston, MD;  [facility];  Service: Endoscopy;  Laterality: N/A;  300   FRACTURE SURGERY  2011   rt ankle   PARTIAL HYSTERECTOMY     STERIOD INJECTION Left 01/12/2013   Procedure: INJECTION LEFT THUMB [place] JOINT;  Surgeon: Cammie Sickle., MD;  Location: [place];  Service: Orthopedics;  Laterality: Left;   SVT ABLATION N/A 05/03/2018   Procedure: SVT ABLATION;  Surgeon: Thompson Grayer, MD;  Location: [facility];  Service: Cardiovascular;  Laterality: N/A;   THYROIDECTOMY, PARTIAL     TIBIA HARDWARE REMOVAL  2011   rt ankle    TRIGGER FINGER RELEASE Left 01/12/2013   Procedure: RELEASE TRIGGER FINGER/A-1 PULLEY;  Surgeon: Cammie Sickle., MD;  Location: [place];  Service: Orthopedics;  Laterality: Left;    Family History  Problem Relation Age of Onset   Diabetes Mother    Cirrhosis Father    Alcohol abuse Brother    Cirrhosis Brother  Anesthesia problems Neg Hx    Hypotension Neg Hx    Malignant hyperthermia Neg Hx    Pseudochol deficiency Neg Hx    Social History:  reports that she quit smoking about 7 years ago. Her smoking use included cigarettes. She has a 0.13 pack-year smoking history. She has never used smokeless tobacco. She reports current alcohol use. She reports that she does not use drugs.  Allergies:  Allergies  Allergen Reactions   Prednisone Anxiety and Other (See  Comments)    Nervous / manic    Ciprofloxacin     Patient states that it was neuro pain , could hardly walk,.   Dairy Aid [Lactase] Other (See Comments)    No all milk products   Erythromycin     Unsure of whether this is an allergy per patient    Nsaids Other (See Comments)    Causes Gastric Problems    Sulfa Antibiotics Hives   Wheat Bran Other (See Comments)    GI upset    Medications Prior to Admission  Medication Sig Dispense Refill   ALPRAZolam (XANAX) 0.5 MG tablet Take 0.5 mg by mouth at bedtime.     Cyanocobalamin (VITAMIN B-12) 5000 MCG SUBL Place 5,000 mcg under the tongue daily.     Cyclobenzaprine HCl (FLEXERIL PO) Take 10 mg by mouth daily as needed (pain).     escitalopram (LEXAPRO) 10 MG tablet Take 10 mg by mouth daily.     esomeprazole (NEXIUM) 20 MG capsule Take 20 mg by mouth daily at 12 noon.     flecainide (TAMBOCOR) 50 MG tablet Take 1 tablet (50 mg total) by mouth 2 (two) times daily. Please make yearly appt with Dr. Rayann Heman for September 2022 for future refills. Thank you 2nd attempt 60 tablet 0   Ixekizumab (TALTZ) 80 MG/ML SOAJ Inject 80 mg into the skin every 14 (fourteen) days.     levothyroxine (SYNTHROID, LEVOTHROID) 75 MCG tablet Take 75 mcg by mouth daily before breakfast.     metFORMIN (GLUCOPHAGE-XR) 500 MG 24 hr tablet Take 1,000 mg by mouth every morning.     metoprolol succinate (TOPROL-XL) 25 MG 24 hr tablet Take 1 tablet (25 mg total) by mouth daily. Please schedule appointment for future refills. Thank you 90 tablet 0   Omega-3 Fatty Acids (FISH OIL PO) Take 1,400 mg by mouth daily.     OVER THE COUNTER MEDICATION Apply 1 application topically daily as needed (knee pain). CBD Cream      Results for orders placed or performed during the hospital encounter of 04/30/21 (from the past 48 hour(s))  Glucose, capillary     Status: Abnormal   Collection Time: 04/30/21 12:24 PM  Result Value Ref Range   Glucose-Capillary 133 (H) 70 - 99 mg/dL     Comment: Glucose reference range applies only to samples taken after fasting for at least 8 hours.   No results found.  Review of Systems  Blood pressure 126/79, pulse (!) 59, temperature 98.9 F (37.2 C), temperature source Oral, resp. rate 18, SpO2 95 %. Physical Exam HENT:     Mouth/Throat:     Mouth: Mucous membranes are moist.     Pharynx: Oropharynx is clear.  Eyes:     General: No scleral icterus.    Conjunctiva/sclera: Conjunctivae normal.  Cardiovascular:     Rate and Rhythm: Normal rate and regular rhythm.     Heart sounds: Normal heart sounds. No murmur heard. Pulmonary:     Effort:  Pulmonary effort is normal.     Breath sounds: Normal breath sounds.  Abdominal:     General: There is no distension.     Palpations: Abdomen is soft. There is no mass.     Tenderness: There is no abdominal tenderness.  Musculoskeletal:        General: No swelling.     Cervical back: Neck supple.  Lymphadenopathy:     Cervical: No cervical adenopathy.  Skin:    General: Skin is warm and dry.  Neurological:     Mental Status: She is alert.     Assessment/Plan  Short segment Barrett's esophagus. Esophageal dysphagia. History of colonic polyps. Esophagogastroduodenoscopy with esophageal dilation and average risk screening colonoscopy.    Hildred Laser, MD 04/30/2021, 1:51 PM

## 2021-05-02 LAB — SURGICAL PATHOLOGY

## 2021-05-05 ENCOUNTER — Encounter (HOSPITAL_COMMUNITY): Payer: Self-pay | Admitting: Internal Medicine

## 2021-05-06 ENCOUNTER — Encounter (INDEPENDENT_AMBULATORY_CARE_PROVIDER_SITE_OTHER): Payer: Self-pay | Admitting: *Deleted

## 2021-05-21 ENCOUNTER — Ambulatory Visit (HOSPITAL_BASED_OUTPATIENT_CLINIC_OR_DEPARTMENT_OTHER): Payer: Medicare HMO | Admitting: Internal Medicine

## 2021-05-21 DIAGNOSIS — I471 Supraventricular tachycardia: Secondary | ICD-10-CM

## 2021-05-30 ENCOUNTER — Other Ambulatory Visit: Payer: Self-pay | Admitting: Internal Medicine

## 2021-06-23 ENCOUNTER — Other Ambulatory Visit: Payer: Self-pay | Admitting: Internal Medicine

## 2021-08-01 ENCOUNTER — Ambulatory Visit (HOSPITAL_BASED_OUTPATIENT_CLINIC_OR_DEPARTMENT_OTHER): Payer: Medicare HMO | Admitting: Internal Medicine

## 2021-08-01 ENCOUNTER — Other Ambulatory Visit: Payer: Self-pay

## 2021-08-01 ENCOUNTER — Encounter (HOSPITAL_BASED_OUTPATIENT_CLINIC_OR_DEPARTMENT_OTHER): Payer: Self-pay | Admitting: Internal Medicine

## 2021-08-01 VITALS — BP 134/74 | HR 62 | Ht 65.5 in | Wt 215.3 lb

## 2021-08-01 DIAGNOSIS — I4719 Other supraventricular tachycardia: Secondary | ICD-10-CM

## 2021-08-01 DIAGNOSIS — I471 Supraventricular tachycardia: Secondary | ICD-10-CM

## 2021-08-01 MED ORDER — FLECAINIDE ACETATE 50 MG PO TABS: 25.0000 mg | ORAL_TABLET | Freq: Two times a day (BID) | ORAL | 3 refills | Status: DC

## 2021-08-01 NOTE — Progress Notes (Signed)
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 female who presents today for routine electrophysiology followup.  Since last being seen in our clinic, the patient reports doing very well.  Today, she denies symptoms of palpitations, chest pain, shortness of breath,  lower extremity edema, dizziness, presyncope, or syncope.  The patient is otherwise without complaint today.   Past Medical History:  Diagnosis Date   Acid reflux    Adverse effect of general anesthetic    shivering recently-had to have demerol   Arthritis    Carpal tunnel syndrome    Focal nodular hyperplasia of liver surgery in 2004   partial hepactectomy   GERD (gastroesophageal reflux disease)    Hypothyroidism    PONV (postoperative nausea and vomiting)    Pre-diabetes    PVNS (pigmented villonodular synovitis)    SVT (supraventricular tachycardia) ([place]) 03/19/2018   documented with EKG in epic.   Wears glasses    Past Surgical History:  Procedure Laterality Date   BIOPSY  04/30/2021   Procedure: BIOPSY;  Surgeon: Shelly Houston, MD;  [facility];  Service: Endoscopy;;   CARPAL TUNNEL RELEASE     rt   CARPAL TUNNEL RELEASE Left 01/12/2013   Procedure: CARPAL TUNNEL RELEASE;  Surgeon: Shelly [name]., MD;  Location: [facility];  Service: Orthopedics;  Laterality: Left;   CARPOMETACARPEL SUSPENSION PLASTY Left 07/24/2014   Procedure: LEFT THUMB CARPOMETACARPEL ([place]) ARTHROPLASTY Shelly [name] ;  Surgeon: Shelly Cover, MD;  Location: [place];  Service: Orthopedics;  Laterality: Left;   CHOLECYSTECTOMY  2004   liver resection benign tumor with GB   COLONOSCOPY  03/05/2011   Procedure: COLONOSCOPY;  Surgeon: Shelly Houston, MD;  [facility];  Service: Endoscopy;  Laterality: N/A;   COLONOSCOPY WITH PROPOFOL N/A 04/30/2021   Procedure: COLONOSCOPY WITH PROPOFOL;  Surgeon: Shelly Houston, MD;  [facility];   Service: Endoscopy;  Laterality: N/A;  12:15   DORSAL COMPARTMENT RELEASE Left 01/12/2013   Procedure: RELEASE DORSAL COMPARTMENT (DEQUERVAIN);  Surgeon: Shelly [name]., MD;  Location: [facility];  Service: Orthopedics;  Laterality: Left;   ESOPHAGOGASTRODUODENOSCOPY  03/05/2011   Procedure: ESOPHAGOGASTRODUODENOSCOPY (EGD);  Surgeon: Shelly Houston, MD;  [facility];  Service: Endoscopy;  Laterality: N/A;   ESOPHAGOGASTRODUODENOSCOPY N/A 05/14/2016   Procedure: ESOPHAGOGASTRODUODENOSCOPY (EGD);  Surgeon: Shelly Houston, MD;  [facility];  Service: Endoscopy;  Laterality: N/A;  300   ESOPHAGOGASTRODUODENOSCOPY (EGD) WITH PROPOFOL N/A 04/30/2021   Procedure: ESOPHAGOGASTRODUODENOSCOPY (EGD) WITH PROPOFOL;  Surgeon: Shelly Houston, MD;  [facility];  Service: Endoscopy;  Laterality: N/A;   FRACTURE SURGERY  2011   rt ankle   HOT HEMOSTASIS  04/30/2021   Procedure: HOT HEMOSTASIS (ARGON PLASMA COAGULATION/BICAP);  Surgeon: Shelly Houston, MD;  [facility];  Service: Endoscopy;;   PARTIAL HYSTERECTOMY     POLYPECTOMY  04/30/2021   Procedure: POLYPECTOMY INTESTINAL;  Surgeon: Shelly Houston, MD;  [facility];  Service: Endoscopy;;   STERIOD INJECTION Left 01/12/2013   Procedure: INJECTION LEFT THUMB [place] JOINT;  Surgeon: Shelly [name]., MD;  Location: [place];  Service: Orthopedics;  Laterality: Left;   SUBMUCOSAL TATTOO INJECTION  04/30/2021   Procedure: SUBMUCOSAL TATTOO INJECTION;  Surgeon: Shelly Houston, MD;  [facility];  Service:  Endoscopy;;   SVT ABLATION N/A 05/03/2018   Procedure: SVT ABLATION;  Surgeon: Shelly Grayer, MD;  Location: [place] [facility];  Service: Cardiovascular;  Laterality: N/A;   THYROIDECTOMY, PARTIAL     TIBIA HARDWARE REMOVAL  2011   rt ankle    TRIGGER FINGER RELEASE Left 01/12/2013   Procedure: RELEASE TRIGGER FINGER/A-1 PULLEY;  Surgeon: Shelly [name]., MD;  Location: [place];  Service: Orthopedics;  Laterality: Left;    ROS- all systems are reviewed and negatives except as per HPI above  Current Outpatient Medications  Medication Sig Dispense Refill   ALPRAZolam (XANAX) 0.5 MG tablet Take 0.5 mg by mouth at bedtime.     Cyanocobalamin (VITAMIN B-12) 5000 MCG SUBL Place 5,000 mcg under the tongue daily.     Cyclobenzaprine HCl (FLEXERIL PO) Take 10 mg by mouth daily as needed (pain).     escitalopram (LEXAPRO) 10 MG tablet Take 10 mg by mouth daily.     esomeprazole (NEXIUM) 20 MG capsule Take 20 mg by mouth daily at 12 noon.     flecainide (TAMBOCOR) 50 MG tablet TAKE ONE TABLET TWICE DAILY 60 tablet 1   Ixekizumab (TALTZ) 80 MG/ML SOAJ Inject 80 mg into the skin every 14 (fourteen) days.     levothyroxine (SYNTHROID, LEVOTHROID) 75 MCG tablet Take 75 mcg by mouth daily before breakfast.     metoprolol succinate (TOPROL-XL) 25 MG 24 hr tablet TAKE 1 TABLET ONCE A DAY WITH A MEAL 90 tablet 0   Omega-3 Fatty Acids (FISH OIL PO) Take 1,400 mg by mouth daily.     OVER THE COUNTER MEDICATION Apply 1 application topically daily as needed (knee pain). CBD Cream     No current facility-administered medications for this visit.    Physical Exam: Vitals:   08/01/21 0950  BP: 134/74  Pulse: 62  SpO2: 95%  Weight: 215 lb 4.8 oz (97.7 kg)  Height: 5' 5.5" (1.664 m)    GEN- The patient is well appearing, alert and oriented x 3 today.   Head- normocephalic, atraumatic Eyes-  Sclera clear, conjunctiva pink Ears- hearing intact Oropharynx- clear Lungs- Clear to ausculation bilaterally, normal work of breathing Heart- Regular rate and rhythm, no murmurs, rubs or gallops, PMI not laterally displaced GI- soft, NT, ND, + BS Extremities- no clubbing, cyanosis, or edema  Wt Readings from Last 3 Encounters:  08/01/21 215 lb 4.8 oz (97.7 kg)  04/28/21 218 lb (98.9 kg)  05/06/20 220 lb (99.8 kg)    EKG tracing  ordered today is personally reviewed and shows sinus  Assessment and Plan:  Atrial tachycardia Well controlled with flecainide wean flecainide to 25mg  BID after the holidays  Risks, benefits and potential toxicities for medications prescribed and/or refilled reviewed with patient today.   Return in 6 months to see cardiology APP  Shelly Grayer MD, [facility] 08/01/2021 9:55 AM

## 2021-08-01 NOTE — Patient Instructions (Addendum)
Medication Instructions:  Reduce Flecainide to 25 mg two times a day Your physician recommends that you continue on your current medications as directed. Please refer to the Current Medication list given to you today. *If you need a refill on your cardiac medications before your next appointment, please call your pharmacy*  Lab Work: None. If you have labs (blood work) drawn today and your tests are completely normal, you will receive your results only by: [place] (if you have MyChart) OR A paper copy in the mail If you have any lab test that is abnormal or we need to change your treatment, we will call you to review the results.  Testing/Procedures: None.  Follow-Up: At [facility], you and your health needs are our priority.  As part of our continuing mission to provide you with exceptional heart care, we have created designated Provider Care Teams.  These Care Teams include your primary Cardiologist (physician) and Advanced Practice Providers (APPs -  Physician Assistants and Nurse Practitioners) who all work together to provide you with the care you need, when you need it.  Your physician wants you to follow-up in: 6 months with  one of the following Advanced Practice Providers on your designated Care Team:    Laurann Montana NP      We recommend signing up for the patient portal called "MyChart".  Sign up information is provided on this After Visit Summary.  MyChart is used to connect with patients for Virtual Visits (Telemedicine).  Patients are able to view lab/test results, encounter notes, upcoming appointments, etc.  Non-urgent messages can be sent to your provider as well.   To learn more about what you can do with MyChart, go to [email or web address].    Any Other Special Instructions Will Be Listed Below (If Applicable).

## 2021-09-27 ENCOUNTER — Other Ambulatory Visit: Payer: Self-pay | Admitting: Internal Medicine

## 2021-10-08 ENCOUNTER — Encounter (INDEPENDENT_AMBULATORY_CARE_PROVIDER_SITE_OTHER): Payer: Self-pay | Admitting: *Deleted

## 2021-12-03 ENCOUNTER — Telehealth (INDEPENDENT_AMBULATORY_CARE_PROVIDER_SITE_OTHER): Payer: Self-pay | Admitting: *Deleted

## 2021-12-03 ENCOUNTER — Encounter (INDEPENDENT_AMBULATORY_CARE_PROVIDER_SITE_OTHER): Payer: Self-pay | Admitting: *Deleted

## 2021-12-03 MED ORDER — PEG 3350-KCL-NA BICARB-NACL 420 G PO SOLR: 4000.0000 mL | Freq: Once | ORAL | 0 refills | Status: AC

## 2021-12-03 NOTE — Telephone Encounter (Signed)
Patient needs trilyte 

## 2021-12-04 ENCOUNTER — Other Ambulatory Visit (INDEPENDENT_AMBULATORY_CARE_PROVIDER_SITE_OTHER): Payer: Self-pay

## 2021-12-04 DIAGNOSIS — Z86010 Personal history of colonic polyps: Secondary | ICD-10-CM

## 2021-12-10 ENCOUNTER — Telehealth (INDEPENDENT_AMBULATORY_CARE_PROVIDER_SITE_OTHER): Payer: Self-pay | Admitting: *Deleted

## 2021-12-10 NOTE — Telephone Encounter (Signed)
Referring MD/PCP: white ? ?Procedure: tcs ? ?Reason/Indication:  hx polyps ? ?Has patient had this procedure before?  Yes, 04/2021 ? If so, when, by whom and where?   ? ?Is there a family history of colon cancer?  no ? Who?  What age when diagnosed?   ? ?Is patient diabetic? If yes, Type 1 or Type 2   no ?     ?Does patient have prosthetic heart valve or mechanical valve?  no ? ?Do you have a pacemaker/defibrillator?  no ? ?Has patient ever had endocarditis/atrial fibrillation? no ? ?Does patient use oxygen? no ? ?Has patient had joint replacement within last 12 months?  no ? ?Is patient constipated or do they take laxatives? yes ? ?Does patient have a history of alcohol/drug use?  no ? ?Have you had a stroke/heart attack last 6 mths? no ? ?Do you take medicine for weight loss?  no ? ?For female patients,: have you had a hysterectomy yes ?                     are you post menopausal  ?                     do you still have your menstrual cycle no ? ?Is patient on blood thinner such as Coumadin, Plavix and/or Aspirin? no ? ?Medications: nexium 20 mg daily, alprazolam 0.5 mg daily, vit b12 5000 mcg daily, cyclobenzaprine 10 mg daily, lexapro 10 mg daily, flecainide 50 mg bid, taltz 80 mg inject every 14 days, levothyroxine 75 mcg daily, metoprolol 25 mg daily, omega 3 daily ? ?Allergies: prednisone, cipro, dairy aid, erythromycin, NSAIDS, sulfur antibiotics ? ?Medication Adjustment per Dr Rehman/Dr Jenetta Downer  ? ?Procedure date & time: 01/08/22 ? ? ?

## 2022-01-08 ENCOUNTER — Ambulatory Visit (HOSPITAL_COMMUNITY): Payer: Medicare HMO | Admitting: Anesthesiology

## 2022-01-08 ENCOUNTER — Encounter (HOSPITAL_COMMUNITY): Payer: Self-pay | Admitting: Internal Medicine

## 2022-01-08 ENCOUNTER — Ambulatory Visit (HOSPITAL_BASED_OUTPATIENT_CLINIC_OR_DEPARTMENT_OTHER): Payer: Medicare HMO | Admitting: Anesthesiology

## 2022-01-08 ENCOUNTER — Other Ambulatory Visit: Payer: Self-pay

## 2022-01-08 ENCOUNTER — Ambulatory Visit (HOSPITAL_COMMUNITY)
Admission: RE | Admit: 2022-01-08 | Discharge: 2022-01-08 | Disposition: A | Discharge status: Home / Self Care | Payer: Medicare HMO | Source: Ambulatory Visit | Attending: Internal Medicine | Admitting: Internal Medicine

## 2022-01-08 ENCOUNTER — Encounter (HOSPITAL_COMMUNITY)
Admission: RE | Disposition: A | Discharge status: Home / Self Care | Payer: Self-pay | Source: Ambulatory Visit | Attending: Internal Medicine

## 2022-01-08 ENCOUNTER — Encounter (INDEPENDENT_AMBULATORY_CARE_PROVIDER_SITE_OTHER): Payer: Self-pay | Admitting: *Deleted

## 2022-01-08 DIAGNOSIS — N3946 Mixed incontinence: Secondary | ICD-10-CM

## 2022-01-08 DIAGNOSIS — Z79890 Hormone replacement therapy (postmenopausal): Secondary | ICD-10-CM | POA: Insufficient documentation

## 2022-01-08 DIAGNOSIS — K6289 Other specified diseases of anus and rectum: Secondary | ICD-10-CM

## 2022-01-08 DIAGNOSIS — B029 Zoster without complications: Secondary | ICD-10-CM

## 2022-01-08 DIAGNOSIS — M7752 Other enthesopathy of left foot: Secondary | ICD-10-CM

## 2022-01-08 DIAGNOSIS — M958 Other specified acquired deformities of musculoskeletal system: Secondary | ICD-10-CM

## 2022-01-08 DIAGNOSIS — K644 Residual hemorrhoidal skin tags: Secondary | ICD-10-CM | POA: Insufficient documentation

## 2022-01-08 DIAGNOSIS — Z860100 Personal history of colon polyps, unspecified: Secondary | ICD-10-CM

## 2022-01-08 DIAGNOSIS — M1220 Villonodular synovitis (pigmented), unspecified site: Secondary | ICD-10-CM

## 2022-01-08 DIAGNOSIS — E876 Hypokalemia: Secondary | ICD-10-CM

## 2022-01-08 DIAGNOSIS — K635 Polyp of colon: Secondary | ICD-10-CM

## 2022-01-08 DIAGNOSIS — K219 Gastro-esophageal reflux disease without esophagitis: Secondary | ICD-10-CM

## 2022-01-08 DIAGNOSIS — Z87891 Personal history of nicotine dependence: Secondary | ICD-10-CM | POA: Insufficient documentation

## 2022-01-08 DIAGNOSIS — Z1211 Encounter for screening for malignant neoplasm of colon: Secondary | ICD-10-CM | POA: Diagnosis present

## 2022-01-08 DIAGNOSIS — D175 Benign lipomatous neoplasm of intra-abdominal organs: Secondary | ICD-10-CM | POA: Diagnosis not present

## 2022-01-08 DIAGNOSIS — Z79899 Other long term (current) drug therapy: Secondary | ICD-10-CM | POA: Insufficient documentation

## 2022-01-08 DIAGNOSIS — E039 Hypothyroidism, unspecified: Secondary | ICD-10-CM

## 2022-01-08 DIAGNOSIS — I471 Supraventricular tachycardia, unspecified: Secondary | ICD-10-CM

## 2022-01-08 DIAGNOSIS — E119 Type 2 diabetes mellitus without complications: Secondary | ICD-10-CM | POA: Diagnosis not present

## 2022-01-08 DIAGNOSIS — K2270 Barrett's esophagus without dysplasia: Secondary | ICD-10-CM

## 2022-01-08 DIAGNOSIS — Z09 Encounter for follow-up examination after completed treatment for conditions other than malignant neoplasm: Secondary | ICD-10-CM | POA: Diagnosis not present

## 2022-01-08 DIAGNOSIS — Z87828 Personal history of other (healed) physical injury and trauma: Secondary | ICD-10-CM

## 2022-01-08 DIAGNOSIS — M797 Fibromyalgia: Secondary | ICD-10-CM

## 2022-01-08 DIAGNOSIS — Z7984 Long term (current) use of oral hypoglycemic drugs: Secondary | ICD-10-CM | POA: Diagnosis not present

## 2022-01-08 DIAGNOSIS — M2391 Unspecified internal derangement of right knee: Secondary | ICD-10-CM

## 2022-01-08 DIAGNOSIS — R1310 Dysphagia, unspecified: Secondary | ICD-10-CM

## 2022-01-08 DIAGNOSIS — L4050 Arthropathic psoriasis, unspecified: Secondary | ICD-10-CM

## 2022-01-08 DIAGNOSIS — M25562 Pain in left knee: Secondary | ICD-10-CM

## 2022-01-08 DIAGNOSIS — R739 Hyperglycemia, unspecified: Secondary | ICD-10-CM

## 2022-01-08 DIAGNOSIS — G8929 Other chronic pain: Secondary | ICD-10-CM

## 2022-01-08 DIAGNOSIS — M792 Neuralgia and neuritis, unspecified: Secondary | ICD-10-CM

## 2022-01-08 DIAGNOSIS — D123 Benign neoplasm of transverse colon: Secondary | ICD-10-CM | POA: Insufficient documentation

## 2022-01-08 HISTORY — PX: HOT HEMOSTASIS: SHX5433

## 2022-01-08 HISTORY — PX: POLYPECTOMY: SHX5525

## 2022-01-08 HISTORY — PX: COLONOSCOPY WITH PROPOFOL: SHX5780

## 2022-01-08 LAB — HM COLONOSCOPY

## 2022-01-08 SURGERY — COLONOSCOPY WITH PROPOFOL
Anesthesia: General

## 2022-01-08 MED ORDER — LACTATED RINGERS IV SOLN: INTRAVENOUS | Status: DC

## 2022-01-08 MED ORDER — LIDOCAINE HCL (CARDIAC) PF 50 MG/5ML IV SOSY
PREFILLED_SYRINGE | INTRAVENOUS | Status: DC | PRN
  Administered 2022-01-08: 50 mg via INTRAVENOUS

## 2022-01-08 MED ORDER — PROPOFOL 500 MG/50ML IV EMUL
INTRAVENOUS | Status: DC | PRN
  Administered 2022-01-08 (×2): 100 ug/kg/min via INTRAVENOUS

## 2022-01-08 MED ORDER — PROPOFOL 10 MG/ML IV BOLUS
INTRAVENOUS | Status: DC | PRN
  Administered 2022-01-08: 120 mg via INTRAVENOUS
  Administered 2022-01-08 (×2): 20 mg via INTRAVENOUS

## 2022-01-08 NOTE — Transfer of Care (Signed)
Immediate Anesthesia Transfer of Care Note  Patient: Shelly [name]  Procedure(s) Performed: COLONOSCOPY WITH PROPOFOL POLYPECTOMY HOT HEMOSTASIS (ARGON PLASMA COAGULATION/BICAP)  Patient Location: Endoscopy Unit  Anesthesia Type:General  Level of Consciousness: awake and patient cooperative  Airway & Oxygen Therapy: Patient Spontanous Breathing  Post-op Assessment: Report given to RN and Post -op Vital signs reviewed and stable  Post vital signs: Reviewed and stable  Last Vitals:  Vitals Value Taken Time  BP 85/62 01/08/22 1404  Temp 36.6 C 01/08/22 1404  Pulse 58 01/08/22 1404  Resp 14 01/08/22 1404  SpO2 99 % 01/08/22 1404    Last Pain:  Vitals:   01/08/22 1404  TempSrc: Oral  PainSc: 0-No pain         Complications: No notable events documented.

## 2022-01-08 NOTE — Anesthesia Postprocedure Evaluation (Signed)
Anesthesia Post Note  Patient: Shelly [name]  Procedure(s) Performed: COLONOSCOPY WITH PROPOFOL POLYPECTOMY HOT HEMOSTASIS (ARGON PLASMA COAGULATION/BICAP)  Patient location during evaluation: Phase II Anesthesia Type: General Level of consciousness: awake and alert and oriented Pain management: pain level controlled Vital Signs Assessment: post-procedure vital signs reviewed and stable Respiratory status: spontaneous breathing, nonlabored ventilation and respiratory function stable Cardiovascular status: blood pressure returned to baseline and stable Postop Assessment: no apparent nausea or vomiting Anesthetic complications: no   No notable events documented.   Last Vitals:  Vitals:   01/08/22 1404 01/08/22 1408  BP: (!) 85/62 102/64  Pulse: (!) 58 (!) 59  Resp: 14 16  Temp: 36.6 C   SpO2: 99% 99%    Last Pain:  Vitals:   01/08/22 1408  TempSrc:   PainSc: 0-No pain                 Linnea Todisco C [name]

## 2022-01-08 NOTE — H&P (Signed)
Shelly [name] is an 61 y.o. female.   Chief Complaint: Patient is here for colonoscopy. HPI: Patient is 61 year old Caucasian female who underwent screening colonoscopy in September 2022 with removal of large multilobulated broad-based polyp in transverse colon.  Polyp was removed piecemeal.  Biopsy revealed sessile serrated polyp without dysplasia.  She is not returning for follow-up exam.  She denies abdominal pain change in bowel habits or rectal bleeding. Family history is negative for colon cancer. She does not take any aspirin or anticoagulants.  Past Medical History:  Diagnosis Date   Acid reflux    Adverse effect of general anesthetic    shivering recently-had to have demerol   Arthritis    Carpal tunnel syndrome    Focal nodular hyperplasia of liver surgery in 2004   partial hepactectomy   GERD (gastroesophageal reflux disease)    Hypothyroidism    PONV (postoperative nausea and vomiting)    Pre-diabetes    PVNS (pigmented villonodular synovitis)    SVT (supraventricular tachycardia) ([place]) 03/19/2018   documented with EKG in epic.   Wears glasses     Past Surgical History:  Procedure Laterality Date   Bilateral knee surgery     BIOPSY  04/30/2021   Procedure: BIOPSY;  Surgeon: Rogene Houston, MD;  [facility];  Service: Endoscopy;;   CARPAL TUNNEL RELEASE     rt   CARPAL TUNNEL RELEASE Left 01/12/2013   Procedure: CARPAL TUNNEL RELEASE;  Surgeon: Cammie Sickle., MD;  Location: [place];  Service: Orthopedics;  Laterality: Left;   CARPOMETACARPEL SUSPENSION PLASTY Left 07/24/2014   Procedure: LEFT THUMB CARPOMETACARPEL ([place]) ARTHROPLASTY GARCIA ELIAS ;  Surgeon: Leanora Cover, MD;  Location: [place];  Service: Orthopedics;  Laterality: Left;   CHOLECYSTECTOMY  08/17/2002   liver resection benign tumor with GB   COLONOSCOPY  03/05/2011   Procedure: COLONOSCOPY;  Surgeon: Rogene Houston, MD;  [facility];   Service: Endoscopy;  Laterality: N/A;   COLONOSCOPY WITH PROPOFOL N/A 04/30/2021   Procedure: COLONOSCOPY WITH PROPOFOL;  Surgeon: Rogene Houston, MD;  [facility];  Service: Endoscopy;  Laterality: N/A;  12:15   DORSAL COMPARTMENT RELEASE Left 01/12/2013   Procedure: RELEASE DORSAL COMPARTMENT (DEQUERVAIN);  Surgeon: Cammie Sickle., MD;  Location: [facility];  Service: Orthopedics;  Laterality: Left;   ESOPHAGOGASTRODUODENOSCOPY  03/05/2011   Procedure: ESOPHAGOGASTRODUODENOSCOPY (EGD);  Surgeon: Rogene Houston, MD;  [facility];  Service: Endoscopy;  Laterality: N/A;   ESOPHAGOGASTRODUODENOSCOPY N/A 05/14/2016   Procedure: ESOPHAGOGASTRODUODENOSCOPY (EGD);  Surgeon: Rogene Houston, MD;  [facility];  Service: Endoscopy;  Laterality: N/A;  300   ESOPHAGOGASTRODUODENOSCOPY (EGD) WITH PROPOFOL N/A 04/30/2021   Procedure: ESOPHAGOGASTRODUODENOSCOPY (EGD) WITH PROPOFOL;  Surgeon: Rogene Houston, MD;  [facility];  Service: Endoscopy;  Laterality: N/A;   FRACTURE SURGERY  08/17/2009   rt ankle   HOT HEMOSTASIS  04/30/2021   Procedure: HOT HEMOSTASIS (ARGON PLASMA COAGULATION/BICAP);  Surgeon: Rogene Houston, MD;  [facility];  Service: Endoscopy;;   PARTIAL HYSTERECTOMY     POLYPECTOMY  04/30/2021   Procedure: POLYPECTOMY INTESTINAL;  Surgeon: Rogene Houston, MD;  [facility];  Service: Endoscopy;;   STERIOD INJECTION Left 01/12/2013   Procedure: INJECTION LEFT THUMB [place] JOINT;  Surgeon: Cammie Sickle., MD;  Location: [place];  Service: Orthopedics;  Laterality: Left;  SUBMUCOSAL TATTOO INJECTION  04/30/2021   Procedure: SUBMUCOSAL TATTOO INJECTION;  Surgeon: Rogene Houston, MD;  [facility];  Service: Endoscopy;;   SVT ABLATION N/A 05/03/2018   Procedure: SVT ABLATION;  Surgeon: Thompson Grayer, MD;  Location: [place] [facility];  Service: Cardiovascular;   Laterality: N/A;   THYROIDECTOMY, PARTIAL     TIBIA HARDWARE REMOVAL  08/17/2009   rt ankle    TRIGGER FINGER RELEASE Left 01/12/2013   Procedure: RELEASE TRIGGER FINGER/A-1 PULLEY;  Surgeon: Cammie Sickle., MD;  Location: [place];  Service: Orthopedics;  Laterality: Left;    Family History  Problem Relation Age of Onset   Diabetes Mother    Cirrhosis Father    Alcohol abuse Brother    Cirrhosis Brother    Anesthesia problems Neg Hx    Hypotension Neg Hx    Malignant hyperthermia Neg Hx    Pseudochol deficiency Neg Hx    Colon cancer Neg Hx    Social History:  reports that she quit smoking about 8 years ago. Her smoking use included cigarettes. She has a 0.13 pack-year smoking history. She has never used smokeless tobacco. She reports current alcohol use. She reports that she does not use drugs.  Allergies:  Allergies  Allergen Reactions   Prednisone Anxiety and Other (See Comments)    Nervous/mania/insomnia    Ciprofloxacin     Patient states that it was neuro pain , could hardly walk,.   Dairy Aid [Tilactase] Other (See Comments)    Limits dairy products   Erythromycin Hives   Nsaids Other (See Comments)    Causes Gastric Problems    Sulfa Antibiotics Hives   Wheat Bran Other (See Comments)    GI upset    Medications Prior to Admission  Medication Sig Dispense Refill   ALPRAZolam (XANAX) 0.5 MG tablet Take 0.5 mg by mouth at bedtime as needed for sleep or anxiety.     escitalopram (LEXAPRO) 10 MG tablet Take 10 mg by mouth daily in the afternoon.     esomeprazole (NEXIUM) 20 MG capsule Take 20 mg by mouth daily before breakfast.     flecainide (TAMBOCOR) 50 MG tablet Take 0.5 tablets (25 mg total) by mouth 2 (two) times daily. 90 tablet 3   GARLIC PO Take 1 tablet by mouth 2 (two) times daily after a meal.     Ixekizumab (TALTZ) 80 MG/ML SOAJ Inject 80 mg into the skin every 30 (thirty) days.     levothyroxine (SYNTHROID, LEVOTHROID) 75 MCG  tablet Take 75 mcg by mouth at bedtime.     Methylcobalamin (METHYL B-12) 5000 MCG CHEW Chew 5,000 mcg by mouth in the morning.     metoprolol succinate (TOPROL-XL) 25 MG 24 hr tablet TAKE 1 TABLET ONCE A DAY WITH A MEAL (Patient taking differently: Take 25 mg by mouth daily at 12 noon.) 90 tablet 3   Omega-3 Fatty Acids (FISH OIL PO) Take 1,400 mg by mouth daily after supper.     polyethylene glycol-electrolytes (NULYTELY) 420 g solution Take 4,000 mLs by mouth as directed.     BELBUCA 150 MCG FILM Place 150 mcg inside cheek 2 (two) times daily as needed for pain.      No results found for this or any previous visit (from the past 48 hour(s)). No results found.  Review of Systems  Blood pressure 135/89, pulse 64, temperature 98.1 F (36.7 C), temperature source Oral, resp. rate 17, height '5\' 5"'$  (  1.651 m), weight 96.2 kg, SpO2 96 %. Physical Exam HENT:     Mouth/Throat:     Mouth: Mucous membranes are moist.     Pharynx: Oropharynx is clear.  Eyes:     General: No scleral icterus.    Conjunctiva/sclera: Conjunctivae normal.  Cardiovascular:     Rate and Rhythm: Normal rate and regular rhythm.     Heart sounds: Normal heart sounds. No murmur heard. Pulmonary:     Effort: Pulmonary effort is normal.     Breath sounds: Normal breath sounds.  Abdominal:     General: There is no distension.     Palpations: Abdomen is soft. There is no mass.     Tenderness: There is no abdominal tenderness.  Musculoskeletal:        General: No swelling.     Cervical back: Neck supple.  Lymphadenopathy:     Cervical: No cervical adenopathy.  Skin:    General: Skin is warm and dry.  Neurological:     Mental Status: She is alert.     Assessment/Plan  History of large sessile serrated polyp. Surveillance colonoscopy.  Hildred Laser, MD 01/08/2022, 1:22 PM

## 2022-01-08 NOTE — Discharge Instructions (Signed)
No aspirin or NSAIDs for 72 hours. Resume usual medications diet as before. No driving for 24 hours. Physician will call with biopsy results.

## 2022-01-08 NOTE — Anesthesia Preprocedure Evaluation (Addendum)
Anesthesia Evaluation  Patient identified by MRN, date of birth, ID band Patient awake    Reviewed: Allergy & Precautions, NPO status , Patient's Chart, lab work & pertinent test results  History of Anesthesia Complications (+) PONV and history of anesthetic complications  Airway Mallampati: III  TM Distance: >3 FB Neck ROM: Full    Dental  (+) Dental Advisory Given, Missing   Pulmonary neg pulmonary ROS, former smoker,    Pulmonary exam normal breath sounds clear to auscultation       Cardiovascular Exercise Tolerance: Good + dysrhythmias Supra Ventricular Tachycardia  Rhythm:Regular Rate:Bradycardia     Neuro/Psych  Neuromuscular disease negative psych ROS   GI/Hepatic Neg liver ROS, GERD  Medicated,  Endo/Other  diabetes, Well Controlled, Type 2, Oral Hypoglycemic AgentsHypothyroidism   Renal/GU negative Renal ROS  negative genitourinary   Musculoskeletal  (+) Arthritis , Fibromyalgia -  Abdominal   Peds negative pediatric ROS (+)  Hematology negative hematology ROS (+)   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)  Reproductive/Obstetrics negative OB ROS                            Anesthesia Physical  Anesthesia Plan  ASA: 3  Anesthesia Plan: General   Post-op Pain Management:    Induction: Intravenous  PONV Risk Score and Plan: TIVA  Airway Management Planned: Natural Airway, Nasal Cannula and Simple Face Mask  Additional Equipment:   Intra-op Plan:   Post-operative Plan:   Informed Consent: I have reviewed the patients History and Physical, chart, labs and discussed the procedure including the risks, benefits and alternatives for the proposed anesthesia with the patient or authorized representative who has indicated his/her understanding and acceptance.     Dental advisory given  Plan Discussed with: CRNA and Surgeon  Anesthesia Plan  Comments:         Anesthesia Quick Evaluation

## 2022-01-08 NOTE — Op Note (Signed)
[facility] Patient Name: Shelly [name] Procedure Date: 01/08/2022 1:27 PM MRN: [id number] Date of Birth: 20-Aug-1960 Attending MD: Hildred Laser , MD CSN: [id number] Age: 61 Admit Type: Outpatient Procedure:                Colonoscopy Indications:              High risk colon cancer surveillance: Personal                            history of colonic polyps Providers:                Hildred Laser, MD, Caprice Kluver, Rosina Lowenstein, RN Referring MD:             Bradly Bienenstock, NP Medicines:                Propofol per Anesthesia Complications:            No immediate complications. Estimated Blood Loss:     Estimated blood loss was minimal. Procedure:                Pre-Anesthesia Assessment:                           - Prior to the procedure, a History and Physical                            was performed, and patient medications and                            allergies were reviewed. The patient's tolerance of                            previous anesthesia was also reviewed. The risks                            and benefits of the procedure and the sedation                            options and risks were discussed with the patient.                            All questions were answered, and informed consent                            was obtained. Prior Anticoagulants: The patient has                            taken no previous anticoagulant or antiplatelet                            agents. ASA Grade Assessment: II - A patient with                            mild systemic disease. After reviewing the risks  and benefits, the patient was deemed in                            satisfactory condition to undergo the procedure.                           After obtaining informed consent, the colonoscope                            was passed under direct vision. Throughout the                            procedure, the patient's blood pressure, pulse, and                             oxygen saturations were monitored continuously. The                            [id number]-HQ190L ([id number]) scope was introduced through                            the anus and advanced to the the cecum, identified                            by appendiceal orifice and ileocecal valve. The                            colonoscopy was performed without difficulty. The                            patient tolerated the procedure well. The quality                            of the bowel preparation was good. The ileocecal                            valve, appendiceal orifice, and rectum were                            photographed. Scope In: 1:29:45 PM Scope Out: 1:59:34 PM Scope Withdrawal Time: 0 hours 20 minutes 17 seconds  Total Procedure Duration: 0 hours 29 minutes 49 seconds  Findings:      The perianal and digital rectal examinations were normal.      A 10 mm polyp was found in the hepatic flexure. The polyp was flat. The       polyp was removed with a piecemeal technique using a cold snare.       Resection and retrieval were complete. The pathology specimen was placed       into Bottle Number 1. Coagulation for treat proximal margin which was       difficult to see. using argon plasma was successful.      Two polyps were found in the transverse colon and hepatic flexure. The       polyps were small in size. These polyps were removed with a  cold snare.       Resection and retrieval were complete. The pathology specimen was placed       into Bottle Number 1.      Two polyps were found in the splenic flexure and transverse colon. The       polyps were diminutive in size. These were biopsied with a cold forceps       for histology. The pathology specimen was placed into Bottle Number 1.      A small polyp was found in the transverse colon. The polyp wasMarshall       [place] linearlinear and flat. It was felt to be either residual or       recurrence at prior polypectomy site.  Coagulation for tissue destruction       using argon plasma was successful.      There was a medium-sized lipoma, in the transverse colon. This lipoma       was located next to polypectomy site which previously had been tattooed       3 cm above and below the polypectomy site.      External hemorrhoids were found during retroflexion. The hemorrhoids       were small.      Anal papilla(e) were hypertrophied. Impression:               - One 10 mm polyp at the hepatic flexure, removed                            piecemeal using a cold snare. Resected and                            retrieved. Treated with argon plasma coagulation                            (APC).                           - Two small polyps in the transverse colon and at                            the hepatic flexure, removed with a cold snare.                            Resected and retrieved.                           - Two diminutive polyps at the splenic flexure and                            in the transverse colon. Biopsied.                           - Small flat linear polyp in the transverse colon                            ablated with APC.It at prior polypectomy site.                           -  Medium-sized lipoma in the transverse colon.                           - External hemorrhoids.                           - Anal papilla(e) were hypertrophied. Moderate Sedation:      Per Anesthesia Care Recommendation:           - Patient has a contact number available for                            emergencies. The signs and symptoms of potential                            delayed complications were discussed with the                            patient. Return to normal activities tomorrow.                            Written discharge instructions were provided to the                            patient.                           - Resume previous diet today.                           - Continue present medications.                            - No aspirin, ibuprofen, naproxen, or other                            non-steroidal anti-inflammatory drugs for 3 days.                           - Await pathology results.                           - Repeat colonoscopy in 3 years for surveillance. Procedure Code(s):        --- Professional ---                           [phone], Colonoscopy, flexible; with ablation of                            tumor(s), polyp(s), or other lesion(s) (includes                            pre- and post-dilation and guide wire passage, when                            performed)  44010, 59, Colonoscopy, flexible; with removal of                            tumor(s), polyp(s), or other lesion(s) by snare                            technique                           45380, 59, Colonoscopy, flexible; with biopsy,                            single or multiple Diagnosis Code(s):        --- Professional ---                           K63.5, Polyp of colon                           Z86.010, Personal history of colonic polyps                           D17.5, Benign lipomatous neoplasm of                            intra-abdominal organs                           K62.89, Other specified diseases of anus and rectum                           K64.4, Residual hemorrhoidal skin tags CPT copyright 2019 American Medical Association. All rights reserved. The codes documented in this report are preliminary and upon coder review may  be revised to meet current compliance requirements. Hildred Laser, MD Hildred Laser, MD 01/08/2022 2:24:21 PM This report has been signed electronically. Number of Addenda: 0

## 2022-01-08 NOTE — Anesthesia Procedure Notes (Signed)
Date/Time: 01/08/2022 1:24 PM Performed by: Vista Deck, CRNA Pre-anesthesia Checklist: Patient identified, Emergency Drugs available, Suction available, Timeout performed and Patient being monitored Patient Re-evaluated:Patient Re-evaluated prior to induction Oxygen Delivery Method: Nasal Cannula

## 2022-01-13 LAB — SURGICAL PATHOLOGY

## 2022-01-29 ENCOUNTER — Ambulatory Visit (HOSPITAL_BASED_OUTPATIENT_CLINIC_OR_DEPARTMENT_OTHER): Payer: Medicare HMO | Admitting: Family

## 2022-01-29 ENCOUNTER — Encounter (HOSPITAL_BASED_OUTPATIENT_CLINIC_OR_DEPARTMENT_OTHER): Payer: Self-pay | Admitting: Family

## 2022-01-29 VITALS — BP 116/76 | HR 52 | Ht 65.0 in | Wt 217.0 lb

## 2022-01-29 DIAGNOSIS — E1169 Type 2 diabetes mellitus with other specified complication: Secondary | ICD-10-CM

## 2022-01-29 DIAGNOSIS — I471 Supraventricular tachycardia: Secondary | ICD-10-CM | POA: Diagnosis not present

## 2022-01-29 DIAGNOSIS — Z6836 Body mass index (BMI) 36.0-36.9, adult: Secondary | ICD-10-CM

## 2022-01-29 MED ORDER — SEMAGLUTIDE (1 MG/DOSE) 4 MG/3ML ~~LOC~~ SOPN: 1.0000 mg | PEN_INJECTOR | SUBCUTANEOUS | 0 refills | Status: DC

## 2022-01-29 MED ORDER — OZEMPIC (0.25 OR 0.5 MG/DOSE) 2 MG/1.5ML ~~LOC~~ SOPN: PEN_INJECTOR | SUBCUTANEOUS | 1 refills | Status: DC

## 2022-01-29 NOTE — Progress Notes (Unsigned)
Office Visit    Patient Name: Shelly [name] Date of Encounter: 01/31/2022  PCP:  [name], NP   [place]  Cardiologist:  None  Advanced Practice Provider:  No care team member to display Electrophysiologist:  Thompson Grayer, MD      Chief Complaint    Shelly [name] is a 61 y.o. female presents today for atrial tachycardia follow up.    Past Medical History    Past Medical History:  Diagnosis Date   Acid reflux    Adverse effect of general anesthetic    shivering recently-had to have demerol   Arthritis    Carpal tunnel syndrome    Focal nodular hyperplasia of liver surgery in 2004   partial hepactectomy   GERD (gastroesophageal reflux disease)    Hypothyroidism    PONV (postoperative nausea and vomiting)    Pre-diabetes    PVNS (pigmented villonodular synovitis)    SVT (supraventricular tachycardia) ([place]) 03/19/2018   documented with EKG in epic.   Wears glasses    Past Surgical History:  Procedure Laterality Date   Bilateral knee surgery     BIOPSY  04/30/2021   Procedure: BIOPSY;  Surgeon: Rogene Houston, MD;  [facility];  Service: Endoscopy;;   CARPAL TUNNEL RELEASE     rt   CARPAL TUNNEL RELEASE Left 01/12/2013   Procedure: CARPAL TUNNEL RELEASE;  Surgeon: Cammie Sickle., MD;  Location: [place];  Service: Orthopedics;  Laterality: Left;   CARPOMETACARPEL SUSPENSION PLASTY Left 07/24/2014   Procedure: LEFT THUMB CARPOMETACARPEL ([place]) ARTHROPLASTY GARCIA ELIAS ;  Surgeon: Leanora Cover, MD;  Location: [place];  Service: Orthopedics;  Laterality: Left;   CHOLECYSTECTOMY  08/17/2002   liver resection benign tumor with GB   COLONOSCOPY  03/05/2011   Procedure: COLONOSCOPY;  Surgeon: Rogene Houston, MD;  [facility];  Service: Endoscopy;  Laterality: N/A;   COLONOSCOPY WITH PROPOFOL N/A 04/30/2021   Procedure: COLONOSCOPY WITH PROPOFOL;  Surgeon: Rogene Houston, MD;  [facility];  Service: Endoscopy;  Laterality: N/A;  12:15   DORSAL COMPARTMENT RELEASE Left 01/12/2013   Procedure: RELEASE DORSAL COMPARTMENT ([place]);  Surgeon: Cammie Sickle., MD;  Location: [facility];  Service: Orthopedics;  Laterality: Left;   ESOPHAGOGASTRODUODENOSCOPY  03/05/2011   Procedure: ESOPHAGOGASTRODUODENOSCOPY (EGD);  Surgeon: Rogene Houston, MD;  [facility];  Service: Endoscopy;  Laterality: N/A;   ESOPHAGOGASTRODUODENOSCOPY N/A 05/14/2016   Procedure: ESOPHAGOGASTRODUODENOSCOPY (EGD);  Surgeon: Rogene Houston, MD;  [facility];  Service: Endoscopy;  Laterality: N/A;  300   ESOPHAGOGASTRODUODENOSCOPY (EGD) WITH PROPOFOL N/A 04/30/2021   Procedure: ESOPHAGOGASTRODUODENOSCOPY (EGD) WITH PROPOFOL;  Surgeon: Rogene Houston, MD;  [facility];  Service: Endoscopy;  Laterality: N/A;   FRACTURE SURGERY  08/17/2009   rt ankle   HOT HEMOSTASIS  04/30/2021   Procedure: HOT HEMOSTASIS (ARGON PLASMA COAGULATION/BICAP);  Surgeon: Rogene Houston, MD;  [facility];  Service: Endoscopy;;   PARTIAL HYSTERECTOMY     POLYPECTOMY  04/30/2021   Procedure: POLYPECTOMY INTESTINAL;  Surgeon: Rogene Houston, MD;  [facility];  Service: Endoscopy;;   STERIOD INJECTION Left 01/12/2013   Procedure: INJECTION LEFT THUMB [place] JOINT;  Surgeon: Cammie Sickle., MD;  Location: [place];  Service: Orthopedics;  Laterality: Left;   SUBMUCOSAL TATTOO INJECTION  04/30/2021  Procedure: SUBMUCOSAL TATTOO INJECTION;  Surgeon: Rogene Houston, MD;  [facility];  Service: Endoscopy;;   SVT ABLATION N/A 05/03/2018   Procedure: SVT ABLATION;  Surgeon: Thompson Grayer, MD;  Location: [place] [facility];  Service: Cardiovascular;  Laterality: N/A;   THYROIDECTOMY, PARTIAL     TIBIA HARDWARE REMOVAL  08/17/2009   rt ankle    TRIGGER FINGER RELEASE Left 01/12/2013   Procedure:  RELEASE TRIGGER FINGER/A-1 PULLEY;  Surgeon: Cammie Sickle., MD;  Location: [place];  Service: Orthopedics;  Laterality: Left;    Allergies  Allergies  Allergen Reactions   Prednisone Anxiety and Other (See Comments)    Nervous/mania/insomnia    Ciprofloxacin     Patient states that it was neuro pain , could hardly walk,.   Dairy Aid [Tilactase] Other (See Comments)    Limits dairy products   Erythromycin Hives   Nsaids Other (See Comments)    Causes Gastric Problems    Sulfa Antibiotics Hives   Wheat Bran Other (See Comments)    GI upset    History of Present Illness    Shelly [name] is a 61 y.o. female with a hx of atrial tachycardia, GERD, DM2, hypothyroidism, SVT last seen 08/01/2021 Dr. Rayann Heman.   Last seen.  6/22 by Dr. Rayann Heman.  She was doing well in regards to her atrial tachycardia and flecainide was weaned to 25 mg twice daily.  She Joaquim Lai today for follow-up.  Endorses doing overall well since last seen.  She did have a "little flutter "a couple weeks ago in the setting of having more caffeine than usual.  She does avoid caffeine usually.  As she is interested in weaning some of her medications wonders whether flecainide or metoprolol could be reduced.  She enjoys walking for exercise though notes her weight has plateaued and is interested in additional assistance with weight loss.  She tried metformin last year due to prediabetes and tells me it made her feel terrible.  Most recent A1c 6.5 on 12/29/2021.  Reports no shortness of breath nor dyspnea on exertion. Reports no chest pain, pressure, or tightness. No edema, orthopnea, PND. Reports no palpitations.    EKGs/Labs/Other Studies Reviewed:   The following studies were reviewed today:   EKG:  EKG is ordered today.  The ekg ordered today demonstrates SB 52 bpm with no acute ST/T wave changes.  Recent Labs: 04/28/2021: BUN 10; Creatinine, Ser 0.75; Potassium 4.2; Sodium 137  Recent Lipid  Panel    Component Value Date/Time   CHOL (H) 06/22/2007 1435    245        ATP III CLASSIFICATION:  <200     mg/dL   Desirable  200-239  mg/dL   Borderline High  >=240    mg/dL   High   TRIG 177 (H) 06/22/2007 1435   HDL 30 (L) 06/22/2007 1435   CHOLHDL 8.2 06/22/2007 1435   VLDL 35 06/22/2007 1435   LDLCALC (H) 06/22/2007 1435    180        Total Cholesterol/HDL:CHD Risk Coronary Heart Disease Risk Table                     Men   Women  1/2 Average Risk   3.4   3.3     Home Medications   Current Meds  Medication Sig   ALPRAZolam (XANAX) 0.5 MG tablet Take 0.5 mg by mouth at bedtime as needed for sleep  or anxiety.   BELBUCA 150 MCG FILM Place 150 mcg inside cheek 2 (two) times daily as needed for pain.   escitalopram (LEXAPRO) 10 MG tablet Take 10 mg by mouth daily in the afternoon.   esomeprazole (NEXIUM) 20 MG capsule Take 20 mg by mouth daily before breakfast.   GARLIC PO Take 1 tablet by mouth 2 (two) times daily after a meal.   Ixekizumab (TALTZ) 80 MG/ML SOAJ Inject 80 mg into the skin every 30 (thirty) days.   levothyroxine (SYNTHROID, LEVOTHROID) 75 MCG tablet Take 75 mcg by mouth at bedtime.   Methylcobalamin (METHYL B-12) 5000 MCG CHEW Chew 5,000 mcg by mouth in the morning.   metoprolol succinate (TOPROL-XL) 25 MG 24 hr tablet TAKE 1 TABLET ONCE A DAY WITH A MEAL (Patient taking differently: Take 25 mg by mouth daily at 12 noon.)   Omega-3 Fatty Acids (FISH OIL PO) Take 1,400 mg by mouth daily after supper.   [START ON 03/31/2022] Semaglutide, 1 MG/DOSE, 4 MG/3ML SOPN Inject 1 mg into the skin once a week.   Semaglutide,0.25 or 0.'5MG'$ /DOS, (OZEMPIC, 0.25 OR 0.5 MG/DOSE,) 2 MG/1.5ML SOPN INJECT 0.25 MG WEEKLY FOR 4 WEEKS THEN INCREASE TO 0.5 MG WEEKLY FOR 4 WEEKS   [DISCONTINUED] flecainide (TAMBOCOR) 50 MG tablet Take 0.5 tablets (25 mg total) by mouth 2 (two) times daily.     Review of Systems      All other systems reviewed and are otherwise negative except as  noted above.  Physical Exam    VS:  BP 116/76   Pulse (!) 52   Ht '5\' 5"'$  (1.651 m)   Wt 217 lb (98.4 kg)   BMI 36.11 kg/m  , BMI Body mass index is 36.11 kg/m.  Wt Readings from Last 3 Encounters:  01/29/22 217 lb (98.4 kg)  01/08/22 212 lb (96.2 kg)  08/01/21 215 lb 4.8 oz (97.7 kg)     GEN: Well nourished, overweight, well developed, in no acute distress. HEENT: normal. Neck: Supple, no JVD, carotid bruits, or masses. Cardiac: RRR, no murmurs, rubs, or gallops. No clubbing, cyanosis, edema.  Radials/PT 2+ and equal bilaterally.  Respiratory:  Respirations regular and unlabored, clear to auscultation bilaterally. GI: Soft, nontender, nondistended. MS: No deformity or atrophy. Skin: Warm and dry, no rash. Neuro:  Strength and sensation are intact. Psych: Normal affect.  Assessment & Plan    Atrial tachycardia - Symptoms overall quiescent. She is interested in being on fewer medications. As such, discontinue Flecainide. Continue Toprol '25mg'$  QD. Continue to avoid caffeine, manage stress well, stay well hydrated. If she has recurrent atrial tachycardia will plan to resume Flecainide '25mg'$  BID.   Obesity / BMI 36 / DM2 - Weight loss via diet and exercise encouraged. Discussed the impact being overweight would have on cardiovascular risk.  Lipid panel for monitoring.  She is interested in additional assistance with weight loss.  Previously did not tolerate metformin.  We will start Ozempic 0.'25mg'$  weekly x 4 weeks, 0.'5mg'$  weekly x 4 weeks, 1 mg weekly x 4 weeks. Further up titration pending how she tolerates and success with weight loss.   Disposition: Follow up in 6 month(s) with Dr. Rayann Heman or Loel Dubonnet, NP  Signed, Loel Dubonnet, NP 01/31/2022, 11:35 AM [place]

## 2022-01-29 NOTE — Patient Instructions (Addendum)
Medication Instructions:   STOP Flecainide  START Ozempic  0.'25mg'$  weekly for 4 weeks 0.'5mg'$  weekly for 4 weeks '1mg'$  weekly for 4 weeks  *If you need a refill on your cardiac medications before your next appointment, please call your pharmacy*   Lab Work: Your physician recommends that you return for lab work in 4 months for fasting lipid panel  Please return for Lab work. You may come to the...   [facility] (3rd floor) [address] [place], [place] [address]  Open: 8am-Noon and 1pm-4:30pm  Please ring the doorbell on the small table when you exit the elevator and the Lab Tech will come get you  [place] at [facility] [address] [place], [place], [place] [address] Open: 8am-1pm, then 2pm-4:30pm   [place]- Please see attached locations sheet stapled to your lab work with address and hours.    If you have labs (blood work) drawn today and your tests are completely normal, you will receive your results only by: [place] (if you have MyChart) OR A paper copy in the mail If you have any lab test that is abnormal or we need to change your treatment, we will call you to review the results.   Testing/Procedures: Your EKG today shows sinus bradycardia which is a stable finding.    Follow-Up: At [facility], you and your health needs are our priority.  As part of our continuing mission to provide you with exceptional heart care, we have created designated Provider Care Teams.  These Care Teams include your primary Cardiologist (physician) and Advanced Practice Providers (APPs -  Physician Assistants and Nurse Practitioners) who all work together to provide you with the care you need, when you need it.  We recommend signing up for the patient portal called "MyChart".  Sign up information is provided on this After Visit Summary.  MyChart is used to connect with patients for Virtual Visits (Telemedicine).  Patients are able to  view lab/test results, encounter notes, upcoming appointments, etc.  Non-urgent messages can be sent to your provider as well.   To learn more about what you can do with MyChart, go to [email or web address].    Your next appointment:   6 month(s)  The format for your next appointment:   In Person  Provider:   Thompson Grayer, MD or Loel Dubonnet, NP     Other Instructions  Heart Healthy Diet Recommendations: A low-salt diet is recommended. Meats should be grilled, baked, or boiled. Avoid fried foods. Focus on lean protein sources like fish or chicken with vegetables and fruits. The American Heart Association is a Microbiologist!  American Heart Association Diet and Lifeystyle Recommendations   Exercise recommendations: The American Heart Association recommends 150 minutes of moderate intensity exercise weekly. Try 30 minutes of moderate intensity exercise 4-5 times per week. This could include walking, jogging, or swimming.   Important Information About Sugar

## 2022-02-09 ENCOUNTER — Encounter (HOSPITAL_COMMUNITY): Payer: Self-pay | Admitting: Internal Medicine

## 2022-04-09 ENCOUNTER — Other Ambulatory Visit (HOSPITAL_BASED_OUTPATIENT_CLINIC_OR_DEPARTMENT_OTHER): Payer: Self-pay | Admitting: Family

## 2022-04-09 DIAGNOSIS — E1169 Type 2 diabetes mellitus with other specified complication: Secondary | ICD-10-CM

## 2022-04-09 DIAGNOSIS — Z6836 Body mass index (BMI) 36.0-36.9, adult: Secondary | ICD-10-CM

## 2022-04-09 MED ORDER — SEMAGLUTIDE (1 MG/DOSE) 4 MG/3ML ~~LOC~~ SOPN: 1.0000 mg | PEN_INJECTOR | SUBCUTANEOUS | 1 refills | Status: DC

## 2022-04-09 NOTE — Telephone Encounter (Signed)
Called and spoke to pt. She stated she has not done the 1 mg dose yet. She stated she was not sure if she was supposed to go up on it or stay at the 0.5 mg. Informed pt we can send in the 1 mg dose for her to do weekly for 4 weeks, then will move to the 2 mg dose. Pt stated that sounds good and requested to send Rx to [facility].

## 2022-04-09 NOTE — Telephone Encounter (Signed)
Please review for refill. Thank you! 

## 2022-04-09 NOTE — Telephone Encounter (Signed)
Please call patient to confirm she has completed 0.'25mg'$  weekly x 1 month, 0.'5mg'$  weekly x 1 month, and '1mg'$  weekly x 1 month.   If so, Rx Ozempic '2mg'$  once per week  Loel Dubonnet, NP

## 2022-06-26 ENCOUNTER — Other Ambulatory Visit (HOSPITAL_BASED_OUTPATIENT_CLINIC_OR_DEPARTMENT_OTHER): Payer: Self-pay | Admitting: Family

## 2022-06-26 NOTE — Telephone Encounter (Signed)
Please review for refill. Thank you! 

## 2022-09-21 ENCOUNTER — Other Ambulatory Visit: Payer: Self-pay | Admitting: Cardiovascular Disease

## 2022-09-21 ENCOUNTER — Other Ambulatory Visit (HOSPITAL_BASED_OUTPATIENT_CLINIC_OR_DEPARTMENT_OTHER): Payer: Self-pay | Admitting: Family

## 2022-09-23 ENCOUNTER — Other Ambulatory Visit: Payer: Self-pay

## 2022-09-23 MED ORDER — METOPROLOL SUCCINATE ER 25 MG PO TB24: ORAL_TABLET | ORAL | 1 refills | Status: DC

## 2022-12-04 ENCOUNTER — Other Ambulatory Visit (HOSPITAL_BASED_OUTPATIENT_CLINIC_OR_DEPARTMENT_OTHER): Payer: Self-pay | Admitting: Family

## 2022-12-04 NOTE — Telephone Encounter (Signed)
Please review for refill. Thank you! 

## 2022-12-07 NOTE — Telephone Encounter (Signed)
Recommend increase ozempic to . May provide 30 days with 1 refill.   Overdue for follow up. CCIng scheduling as FYI.  Alver Sorrow, NP

## 2022-12-09 NOTE — Telephone Encounter (Signed)
Scheduled 01/04/23 with Gillian Shields, NP

## 2022-12-09 NOTE — Telephone Encounter (Signed)
Please see note from Gillian Shields, NP concerning dose for Ozempic. Thank you!

## 2023-01-04 ENCOUNTER — Encounter (HOSPITAL_BASED_OUTPATIENT_CLINIC_OR_DEPARTMENT_OTHER): Payer: Self-pay | Admitting: Family

## 2023-01-04 ENCOUNTER — Ambulatory Visit (HOSPITAL_BASED_OUTPATIENT_CLINIC_OR_DEPARTMENT_OTHER): Payer: Medicare HMO | Admitting: Family

## 2023-01-04 VITALS — BP 120/70 | HR 75 | Ht 65.0 in | Wt 211.2 lb

## 2023-01-04 DIAGNOSIS — I4719 Other supraventricular tachycardia: Secondary | ICD-10-CM | POA: Diagnosis not present

## 2023-01-04 DIAGNOSIS — Z6835 Body mass index (BMI) 35.0-35.9, adult: Secondary | ICD-10-CM | POA: Diagnosis not present

## 2023-01-04 DIAGNOSIS — I700 Atherosclerosis of aorta: Secondary | ICD-10-CM | POA: Diagnosis not present

## 2023-01-04 DIAGNOSIS — E1169 Type 2 diabetes mellitus with other specified complication: Secondary | ICD-10-CM | POA: Diagnosis not present

## 2023-01-04 DIAGNOSIS — Z7985 Long-term (current) use of injectable non-insulin antidiabetic drugs: Secondary | ICD-10-CM

## 2023-01-04 DIAGNOSIS — Z6836 Body mass index (BMI) 36.0-36.9, adult: Secondary | ICD-10-CM

## 2023-01-04 DIAGNOSIS — E782 Mixed hyperlipidemia: Secondary | ICD-10-CM

## 2023-01-04 MED ORDER — ROSUVASTATIN CALCIUM 5 MG PO TABS: 5.0000 mg | ORAL_TABLET | ORAL | 2 refills | Status: AC

## 2023-01-04 MED ORDER — OZEMPIC (2 MG/DOSE) 8 MG/3ML ~~LOC~~ SOPN
2.0000 mg | PEN_INJECTOR | SUBCUTANEOUS | 11 refills | Status: DC
Start: 2023-01-04 — End: 2023-01-05

## 2023-01-04 NOTE — Patient Instructions (Signed)
Medication Instructions:  Your physician has recommended you make the following change in your medication:   Start: rosuvastatin 5mg  daily 3 times per week   Increase: ozempic 2mg  weekly  *If you need a refill on your cardiac medications before your next appointment, please call your pharmacy*   Lab Work: Please return for Lab work in 2 months for fasting lipid panel, Lpa and LFTS . You may come to the...   [facility] (3rd floor) [address] [place], [place] [address]  Open: 8am-Noon and 1pm-4:30pm  Please ring the doorbell on the small table when you exit the elevator and the Lab Tech will come get you  [facility] at [facility] [address], [place], [place] [address] Open: 8am-1pm, then 2pm-4:30pm   [facility]- Please see attached locations sheet stapled to your lab work with address and hours.   If you have labs (blood work) drawn today and your tests are completely normal, you will receive your results only by: MyChart Message (if you have MyChart) OR A paper copy in the mail If you have any lab test that is abnormal or we need to change your treatment, we will call you to review the results.  Follow-Up: At [facility], you and your health needs are our priority.  As part of our continuing mission to provide you with exceptional heart care, we have created designated Provider Care Teams.  These Care Teams include your primary Cardiologist (physician) and Advanced Practice Providers (APPs -  Physician Assistants and Nurse Practitioners) who all work together to provide you with the care you need, when you need it.  We recommend signing up for the patient portal called "MyChart".  Sign up information is provided on this After Visit Summary.  MyChart is used to connect with patients for Virtual Visits (Telemedicine).  Patients are able to view lab/test results, encounter notes, upcoming appointments, etc.  Non-urgent  messages can be sent to your provider as well.   To learn more about what you can do with MyChart, go to [email or web address].    Your next appointment:   6 month(s)  Provider:   Gillian Shields, NP

## 2023-01-04 NOTE — Progress Notes (Signed)
Office Visit    Patient Name: Shelly [name] Date of Encounter: 01/04/2023  PCP:  April Manson, NP   [facility]  Cardiologist:  None  Advanced Practice Provider:  Alver Sorrow, NP Electrophysiologist:  Hillis Range, MD (Inactive)      Chief Complaint    Shelly [name] is a 62 y.o. female presents today for atrial tachycardia follow up.    Past Medical History    Past Medical History:  Diagnosis Date   Acid reflux    Adverse effect of general anesthetic    shivering recently-had to have demerol   Arthritis    Carpal tunnel syndrome    Focal nodular hyperplasia of liver surgery in 2004   partial hepactectomy   GERD (gastroesophageal reflux disease)    Hypothyroidism    PONV (postoperative nausea and vomiting)    Pre-diabetes    PVNS (pigmented villonodular synovitis)    SVT (supraventricular tachycardia) 03/19/2018   documented with EKG in epic.   Wears glasses    Past Surgical History:  Procedure Laterality Date   Bilateral knee surgery     BIOPSY  04/30/2021   Procedure: BIOPSY;  Surgeon: Malissa Hippo, MD;  [facility];  Service: Endoscopy;;   CARPAL TUNNEL RELEASE     rt   CARPAL TUNNEL RELEASE Left 01/12/2013   Procedure: CARPAL TUNNEL RELEASE;  Surgeon: Wyn Forster., MD;  Location: [facility];  Service: Orthopedics;  Laterality: Left;   CARPOMETACARPEL SUSPENSION PLASTY Left 07/24/2014   Procedure: LEFT THUMB CARPOMETACARPEL (CMC) ARTHROPLASTY GARCIA ELIAS ;  Surgeon: Betha Loa, MD;  Location: [facility];  Service: Orthopedics;  Laterality: Left;   CHOLECYSTECTOMY  08/17/2002   liver resection benign tumor with GB   COLONOSCOPY  03/05/2011   Procedure: COLONOSCOPY;  Surgeon: Malissa Hippo, MD;  [facility];  Service: Endoscopy;  Laterality: N/A;   COLONOSCOPY WITH PROPOFOL N/A 04/30/2021   Procedure: COLONOSCOPY WITH PROPOFOL;  Surgeon: Malissa Hippo,  MD;  [facility];  Service: Endoscopy;  Laterality: N/A;  12:15   COLONOSCOPY WITH PROPOFOL N/A 01/08/2022   Procedure: COLONOSCOPY WITH PROPOFOL;  Surgeon: Malissa Hippo, MD;  [facility];  Service: Endoscopy;  Laterality: N/A;  1255   DORSAL COMPARTMENT RELEASE Left 01/12/2013   Procedure: RELEASE DORSAL COMPARTMENT (DEQUERVAIN);  Surgeon: Wyn Forster., MD;  Location: [facility];  Service: Orthopedics;  Laterality: Left;   ESOPHAGOGASTRODUODENOSCOPY  03/05/2011   Procedure: ESOPHAGOGASTRODUODENOSCOPY (EGD);  Surgeon: Malissa Hippo, MD;  [facility];  Service: Endoscopy;  Laterality: N/A;   ESOPHAGOGASTRODUODENOSCOPY N/A 05/14/2016   Procedure: ESOPHAGOGASTRODUODENOSCOPY (EGD);  Surgeon: Malissa Hippo, MD;  [facility];  Service: Endoscopy;  Laterality: N/A;  300   ESOPHAGOGASTRODUODENOSCOPY (EGD) WITH PROPOFOL N/A 04/30/2021   Procedure: ESOPHAGOGASTRODUODENOSCOPY (EGD) WITH PROPOFOL;  Surgeon: Malissa Hippo, MD;  [facility];  Service: Endoscopy;  Laterality: N/A;   FRACTURE SURGERY  08/17/2009   rt ankle   HOT HEMOSTASIS  04/30/2021   Procedure: HOT HEMOSTASIS (ARGON PLASMA COAGULATION/BICAP);  Surgeon: Malissa Hippo, MD;  [facility];  Service: Endoscopy;;   HOT HEMOSTASIS  01/08/2022   Procedure: HOT HEMOSTASIS (ARGON PLASMA COAGULATION/BICAP);  Surgeon: Malissa Hippo, MD;  [facility];  Service: Endoscopy;;   PARTIAL HYSTERECTOMY     POLYPECTOMY  04/30/2021  Procedure: POLYPECTOMY INTESTINAL;  Surgeon: Malissa Hippo, MD;  [facility];  Service: Endoscopy;;   POLYPECTOMY  01/08/2022   Procedure: POLYPECTOMY;  Surgeon: Malissa Hippo, MD;  [facility];  Service: Endoscopy;;   STERIOD INJECTION Left 01/12/2013   Procedure: INJECTION LEFT THUMB CMC JOINT;  Surgeon: Wyn Forster., MD;  Location: [facility];  Service:  Orthopedics;  Laterality: Left;   SUBMUCOSAL TATTOO INJECTION  04/30/2021   Procedure: SUBMUCOSAL TATTOO INJECTION;  Surgeon: Malissa Hippo, MD;  [facility];  Service: Endoscopy;;   SVT ABLATION N/A 05/03/2018   Procedure: SVT ABLATION;  Surgeon: Hillis Range, MD;  [facility];  Service: Cardiovascular;  Laterality: N/A;   THYROIDECTOMY, PARTIAL     TIBIA HARDWARE REMOVAL  08/17/2009   rt ankle    TRIGGER FINGER RELEASE Left 01/12/2013   Procedure: RELEASE TRIGGER FINGER/A-1 PULLEY;  Surgeon: Wyn Forster., MD;  Location: [facility];  Service: Orthopedics;  Laterality: Left;    Allergies  Allergies  Allergen Reactions   Prednisone Anxiety and Other (See Comments)    Nervous/mania/insomnia    Ciprofloxacin     Patient states that it was neuro pain , could hardly walk,.   Dairy Aid [Tilactase] Other (See Comments)    Limits dairy products   Erythromycin Hives   Nsaids Other (See Comments)    Causes Gastric Problems    Sulfa Antibiotics Hives   Wheat Other (See Comments)    GI upset    History of Present Illness    Shelly [name] is a 62 y.o. female with a hx of atrial tachycardia, GERD, DM2, hypothyroidism, SVT, obesity, aortic atherosclerosis, hyperlipidemia psoriatic arthritis last seen 01/29/2022  Seen 01/2021 by Dr. Johney Frame doing well in regards to atrial tachycardia, flecainide was weaned to 25 mg twice daily.  Seen 01/29/2022 with atrial tachycardia well-controlled and flecainide discontinued.  She was started on Ozempic for DM2 and weight loss benefit which was gradually uptitrated to 1 mg dose.  Presents today for follow-up independently.  Notes she unfortunately lost her aunt earlier this month who was her mom's twin sister.  She has good support system.  Did not notice recurrent palpitations despite stressors.  She has lost 6 pounds since clinic visit 11 months ago.  Exercise tolerance has been limited by knee pain and  follows with orthopedics regarding recurrent pigmented villonodular synovitis.  Reports no shortness of breath nor dyspnea on exertion. Reports no chest pain, pressure, or tightness. No edema, orthopnea, PND. Reports no palpitations.    Labs via Care Everywhere 12/31/2022 A1c 5.6 (prior 12/2021 A1c 6.5) 12/31/2022 WBC 4.8, hemoglobin 13.4, hematocrit 40.5 12/31/2022 creatinine 0.83, K4.9, AST 18, ALT 22, alkaline phosphate 74 12/31/2022 total cholesterol [facility] 154, HDL 48, LDL 162 12/31/2022 TSH 0.586, free T41.51  EKGs/Labs/Other Studies Reviewed:   The following studies were reviewed today:   EKG:  EKG is ordered today.  The ekg ordered today demonstrates NSR 75 bpm with no acute ST/T wave changes.  Recent Labs: No results found for requested labs within last 365 days.  Recent Lipid Panel    Component Value Date/Time   CHOL (H) 06/22/2007 1435    245        ATP III CLASSIFICATION:  <200     mg/dL   Desirable  657-846  mg/dL   Borderline High  >=962    mg/dL   High  TRIG 177 (H) 06/22/2007 1435   HDL 30 (L) 06/22/2007 1435   CHOLHDL 8.2 06/22/2007 1435   VLDL 35 06/22/2007 1435   LDLCALC (H) 06/22/2007 1435    180        Total Cholesterol/HDL:CHD Risk Coronary Heart Disease Risk Table                     Men   Women  1/2 Average Risk   3.4   3.3     Home Medications   Current Meds  Medication Sig   ALPRAZolam (XANAX) 0.5 MG tablet Take 0.5 mg by mouth at bedtime as needed for sleep or anxiety.   escitalopram (LEXAPRO) 10 MG tablet Take 10 mg by mouth daily in the afternoon.   esomeprazole (NEXIUM) 20 MG capsule Take 20 mg by mouth daily before breakfast.   GARLIC PO Take 1 tablet by mouth 2 (two) times daily after a meal.   Ixekizumab (TALTZ) 80 MG/ML SOAJ Inject 80 mg into the skin every 30 (thirty) days.   levothyroxine (SYNTHROID, LEVOTHROID) 75 MCG tablet Take 75 mcg by mouth at bedtime.   Methylcobalamin (METHYL B-12) 5000 MCG CHEW Chew 5,000 mcg by  mouth in the morning.   metoprolol succinate (TOPROL-XL) 25 MG 24 hr tablet Take 12.5 mg by mouth daily.   Omega-3 Fatty Acids (FISH OIL PO) Take 1,400 mg by mouth daily after supper.   rosuvastatin (CRESTOR) 5 MG tablet Take 1 tablet (5 mg total) by mouth 3 (three) times a week.   Semaglutide, 2 MG/DOSE, (OZEMPIC, 2 MG/DOSE,) 8 MG/3ML SOPN Inject 2 mg into the skin every 14 (fourteen) days.   [DISCONTINUED] OZEMPIC, 1 MG/DOSE, 4 MG/3ML SOPN INJECT 1MG  INTO THE SKIN ONCE A WEEK     Review of Systems      All other systems reviewed and are otherwise negative except as noted above.  Physical Exam    VS:  BP 120/70   Pulse 75   Ht 5\' 5"  (1.651 m)   Wt 211 lb 3.2 oz (95.8 kg)   BMI 35.15 kg/m  , BMI Body mass index is 35.15 kg/m.  Wt Readings from Last 3 Encounters:  01/04/23 211 lb 3.2 oz (95.8 kg)  01/29/22 217 lb (98.4 kg)  01/08/22 212 lb (96.2 kg)     GEN: Well nourished, overweight, well developed, in no acute distress. HEENT: normal. Neck: Supple, no JVD, carotid bruits, or masses. Cardiac: RRR, no murmurs, rubs, or gallops. No clubbing, cyanosis, edema.  Radials/PT 2+ and equal bilaterally.  Respiratory:  Respirations regular and unlabored, clear to auscultation bilaterally. GI: Soft, nontender, nondistended. MS: No deformity or atrophy. Skin: Warm and dry, no rash. Neuro:  Strength and sensation are intact. Psych: Normal affect.  Assessment & Plan    Atrial tachycardia - Symptoms quiescent. No recurrent symptoms since discontinuation of flecainide.  Continue Toprol 25mg  QD. Continue to avoid caffeine, manage stress well, stay well hydrated. If she has recurrent atrial tachycardia will plan to resume Flecainide 25mg  BID.   Obesity / BMI 35 / DM2 - Weight loss via diet and exercise encouraged. Discussed the impact being overweight would have on cardiovascular risk.  Previously did not tolerate metformin.  12/31/2022 A1c 5.6.  Increase Ozempic from 1 mg to 2 mg today for  additional weight loss benefit.  Aortic atherosclerosis / HLD - ASVD risk 10-year risk score 12%. 12/31/2022 total cholesterol [facility] 154, HDL 48, LDL 162.  Took statin many  years ago with subsequent myalgias particularly her hips.  She is interested in lowering cholesterol.  She is agreeable to trial rosuvastatin 5 mg 3 times per week.  Repeat FLP/LFT/Lp(a) in 2 months.  If does not tolerate consider Repatha versus what Kubo.  Will reach out to pharmacy team to discuss coverage options.  Disposition: Follow up in 6 month(s) with  Alver Sorrow, NP   Signed, Alver Sorrow, NP 01/04/2023, 8:35 PM  [facility]

## 2023-01-05 ENCOUNTER — Telehealth: Payer: Self-pay | Admitting: Family

## 2023-01-05 DIAGNOSIS — E1169 Type 2 diabetes mellitus with other specified complication: Secondary | ICD-10-CM

## 2023-01-05 DIAGNOSIS — Z6835 Body mass index (BMI) 35.0-35.9, adult: Secondary | ICD-10-CM

## 2023-01-05 MED ORDER — OZEMPIC (2 MG/DOSE) 8 MG/3ML ~~LOC~~ SOPN
2.0000 mg | PEN_INJECTOR | SUBCUTANEOUS | 11 refills | Status: DC
Start: 2023-01-05 — End: 2024-01-06

## 2023-01-05 NOTE — Telephone Encounter (Signed)
Reviewed chart, prescription was sent for every 14 days instead of weekly. New Script sent to the pharmacy.

## 2023-01-05 NOTE — Telephone Encounter (Signed)
Pharmacy called to f/u on getting a callback regarding medication. Please advise

## 2023-01-05 NOTE — Telephone Encounter (Signed)
Pt c/o medication issue:  1. Name of Medication: Semaglutide, 2 MG/DOSE, (OZEMPIC, 2 MG/DOSE,) 8 MG/3ML SOPN   2. How are you currently taking this medication (dosage and times per day)?   3. Are you having a reaction (difficulty breathing--STAT)?   4. What is your medication issue? Pharmacy is calling to get clarification on dosage instructions. Please advise.

## 2024-01-06 ENCOUNTER — Other Ambulatory Visit: Payer: Self-pay | Admitting: Family

## 2024-01-06 DIAGNOSIS — Z6835 Body mass index (BMI) 35.0-35.9, adult: Secondary | ICD-10-CM

## 2024-01-06 DIAGNOSIS — E1169 Type 2 diabetes mellitus with other specified complication: Secondary | ICD-10-CM

## 2024-03-10 ENCOUNTER — Telehealth (HOSPITAL_BASED_OUTPATIENT_CLINIC_OR_DEPARTMENT_OTHER): Payer: Self-pay | Admitting: Family

## 2024-03-10 NOTE — Telephone Encounter (Signed)
 Received a call from [facility] re: stating medication for pt.  After viewing pt's chart, she is on Rosuvastatin  three times a week.  Called pt to discuss, voicemail is full.

## 2024-03-10 NOTE — Telephone Encounter (Signed)
 Caller Catering manager) called to recommend patient be prescribed statin medication.

## 2024-03-14 NOTE — Telephone Encounter (Signed)
 Last seen 12/2022 recommended for 6 mos follow up. Overdue for OV. Recommend scheduling overdue OV and can discuss possible statin at that time.   Jayln Branscom S Kenadee Gates, NP

## 2024-03-30 NOTE — Progress Notes (Signed)
 Cardiology Office Note   Date:  03/31/2024  ID:  Shelly [name], Shelly [name] 1960/09/19, MRN [id number] PCP: Teresa Aldona CROME, NP  [facility] Providers Cardiologist:  None  Cardiology APP:  Vannie Reche RAMAN, NP  Electrophysiologist:  Lynwood Rakers, MD (Inactive)     History of Present Illness Shelly [name] is a 63 y.o. female with a hx of atrial tachycardia, GERD, DM2, hypothyroidism, SVT, obesity, aortic atherosclerosis, hyperlipidemia, psoriatic arthritis.    Seen 01/2021 by Dr. Rakers doing well in regards to atrial tachycardia, flecainide  was weaned to 25 mg twice daily.   Seen 01/29/2022 with atrial tachycardia well-controlled and flecainide  discontinued.  She was started on Ozempic  for DM2 and weight loss benefit which was gradually uptitrated to 1 mg dose.  At visit 01/04/23 with atrial tachycardia. Ozempic  increased from 1mg  to 2mg  weekly. As LDL elevated, Rosuvastatin  5mg  three times per week initiated due to prior myalgias. Lp(a), lipid panel ordered but not collected.   Exercise tolerance has been limited by knee pain. Follows with orthopedics regarding recurrent pigmented villonodular synovitis.  Patient presents to the office today  alone.  Denies chest pain, shortness of breath, palpitations, dizziness, near-syncope, nausea, dark/bloody stools, lower extremity edema. She apologizes for missing her last visit because she unfortunately experiencing the unexpected death of her husband of 40 years.  States she is only just now starting to recover from the trauma and is still adjusting to life without him.  States overall she feels she is doing well, she has not had any episodes of atrial tachycardia recently, has not used her flecainide , is no longer on the metoprolol .  The last time she was here she was going to attempt taking the rosuvastatin  and titrating herself up to a normal dose; however, she was unable to tolerate the muscle aches.  We decided on trying Repatha  injections for  cholesterol management, most recent lipid panel was performed at [facility] on 02/29/2024, cholesterol 260, triglycerides 168, HDL 61, LDL 169.  ROS: Please see the history of present illness.    All other systems reviewed and are negative.       Physical Exam VS:  BP 121/78 (BP Location: Left Arm, Patient Position: Sitting, Cuff Size: Normal)   Pulse 65   Resp 16   Ht 5' 5 (1.651 m)   Wt 178 lb (80.7 kg)   SpO2 99%   BMI 29.62 kg/m        Wt Readings from Last 3 Encounters:  03/31/24 178 lb (80.7 kg)  01/04/23 211 lb 3.2 oz (95.8 kg)  01/29/22 217 lb (98.4 kg)    GEN: Well nourished, well developed in no acute distress NECK: No carotid bruits CARDIAC: S1-S2 normal, RRR, no murmurs, rubs, gallops.  Radial, DP, PT +2 RESPIRATORY:  Clear to auscultation without rales, wheezing or rhonchi  ABDOMEN: Soft, non-tender, non-distended EXTREMITIES:  No edema; No deformity   ASSESSMENT AND PLAN  Atrial tachycardia -denies any recent episodes of tachycardia. Continue to avoid caffeine, manage stress well, stay well hydrated.  She is not taking metoprolol , will not resume as there have been no recent episodes.   Obesity / BMI 29  -patient has lost 33 pounds since her last visit over a year ago, which she attributes to Ozempic , dieting, as well as her decreased appetite after losing her husband.  Patient advised to continue to eat healthy, exercise, and discussed different grief management options and counseling with patient.   Aortic atherosclerosis / HLD -patient was  unable to tolerate pravastatin or rosuvastatin  at her last visit due to muscle aches. Will start Repatha  injections once every 2 weeks, follow-up lipid panel to be drawn in 3 months.    Dispo: Follow-up in 6 months  Signed, Iverna Hammac E Javi Bollman, NP

## 2024-03-31 ENCOUNTER — Telehealth (HOSPITAL_BASED_OUTPATIENT_CLINIC_OR_DEPARTMENT_OTHER): Payer: Self-pay

## 2024-03-31 ENCOUNTER — Encounter (HOSPITAL_BASED_OUTPATIENT_CLINIC_OR_DEPARTMENT_OTHER): Payer: Self-pay | Admitting: Family

## 2024-03-31 ENCOUNTER — Ambulatory Visit (HOSPITAL_BASED_OUTPATIENT_CLINIC_OR_DEPARTMENT_OTHER): Admitting: Emergency Medicine

## 2024-03-31 ENCOUNTER — Other Ambulatory Visit (HOSPITAL_COMMUNITY): Payer: Self-pay

## 2024-03-31 VITALS — BP 121/78 | HR 65 | Resp 16 | Ht 65.0 in | Wt 178.0 lb

## 2024-03-31 DIAGNOSIS — I700 Atherosclerosis of aorta: Secondary | ICD-10-CM

## 2024-03-31 DIAGNOSIS — Z789 Other specified health status: Secondary | ICD-10-CM

## 2024-03-31 DIAGNOSIS — Z79899 Other long term (current) drug therapy: Secondary | ICD-10-CM

## 2024-03-31 DIAGNOSIS — E782 Mixed hyperlipidemia: Secondary | ICD-10-CM

## 2024-03-31 DIAGNOSIS — I4719 Other supraventricular tachycardia: Secondary | ICD-10-CM | POA: Diagnosis not present

## 2024-03-31 MED ORDER — REPATHA SURECLICK 140 MG/ML ~~LOC~~ SOAJ: 140.0000 mg | SUBCUTANEOUS | 3 refills | Status: AC

## 2024-03-31 NOTE — Telephone Encounter (Signed)
 Patient was started on Repatha . Will send message to pharm team.

## 2024-03-31 NOTE — Patient Instructions (Signed)
 Medication Instructions:  Please START repatha 140 mg subcutaneous every 14 days.  *If you need a refill on your cardiac medications before your next appointment, please call your pharmacy*  Lab Work: Please complete a FASTING lipid panel in 3 months.   If you have labs (blood work) drawn today and your tests are completely normal, you will receive your results only by: MyChart Message (if you have MyChart) OR A paper copy in the mail If you have any lab test that is abnormal or we need to change your treatment, we will call you to review the results.  Testing/Procedures: None.  Follow-Up: At [facility], you and your health needs are our priority.  As part of our continuing mission to provide you with exceptional heart care, our providers are all part of one team.  This team includes your primary Cardiologist (physician) and Advanced Practice Providers or APPs (Physician Assistants and Nurse Practitioners) who all work together to provide you with the care you need, when you need it.  Your next appointment:   6 month(s)  Provider:   Glennon Finder, NP

## 2024-04-03 ENCOUNTER — Other Ambulatory Visit (HOSPITAL_COMMUNITY): Payer: Self-pay

## 2024-04-03 ENCOUNTER — Telehealth: Payer: Self-pay | Admitting: Pharmacy Technician

## 2024-04-03 NOTE — Telephone Encounter (Signed)
 Pharmacy Patient Advocate Encounter   Received notification from Physician's Office that prior authorization for REPATHA  is required/requested.   Insurance verification completed.   The patient is insured through [employer] .   Per test claim: The current 28 day co-pay is, $0.  No PA needed at this time. This test claim was processed through [facility]- copay amounts may vary at other pharmacies due to pharmacy/plan contracts, or as the patient moves through the different stages of their insurance plan.

## 2024-04-03 NOTE — Telephone Encounter (Signed)
 Pharmacy Patient Advocate Encounter  Received notification from [employer] azzie) that Prior Authorization for Repatha  has been APPROVED from 08/18/23 to 08/16/24   PA #/Case ID/Reference #: [id number]

## 2024-08-16 ENCOUNTER — Telehealth: Payer: Self-pay | Admitting: Family

## 2024-08-16 NOTE — Telephone Encounter (Signed)
 She did not tolerate statin, well documented in cardiology  notes. She has instead been started on Repatha  which is appropriate lipid-lowering therapy.   Shelly [name] S Noemi Bellissimo, NP

## 2024-08-16 NOTE — Telephone Encounter (Signed)
 Shelly [name] with [employer] calling to ask the provider review notes. This patient is on diabetic medication and the American [employer] and Di[employer] Association recommends a statin therapy. Please advise.

## 2024-08-16 NOTE — Telephone Encounter (Signed)
 See documentation from Reche Finder, NP in this encounter.    Shelly [name] will need to get a release of information signed by the pt, in order to get any documentation received from our office on this pts cardiac care.

## 2024-09-07 ENCOUNTER — Ambulatory Visit (HOSPITAL_BASED_OUTPATIENT_CLINIC_OR_DEPARTMENT_OTHER): Payer: Self-pay | Admitting: Family

## 2024-09-07 LAB — LIPID PANEL
Chol/HDL Ratio: 2.7 ratio (ref 0.0–4.4)
Cholesterol, Total: 173 mg/dL (ref 100–199)
HDL: 64 mg/dL
LDL Chol Calc (NIH): 90 mg/dL (ref 0–99)
Triglycerides: 106 mg/dL (ref 0–149)
VLDL Cholesterol Cal: 19 mg/dL (ref 5–40)

## 2024-12-15 ENCOUNTER — Ambulatory Visit (HOSPITAL_BASED_OUTPATIENT_CLINIC_OR_DEPARTMENT_OTHER): Admitting: Family
